# Patient Record
Sex: Male | Born: 1955 | Race: Black or African American | Hispanic: No | Marital: Single | Smoking: Never smoker
Health system: Southern US, Community
[De-identification: ages and names within clinical notes are randomized; demographics above are authoritative.]

## PROBLEM LIST (undated history)

## (undated) ENCOUNTER — Emergency Department (HOSPITAL_COMMUNITY): Admission: EM | Discharge status: Absent without leave | Payer: Self-pay

## (undated) DIAGNOSIS — G9340 Encephalopathy, unspecified: Secondary | ICD-10-CM

## (undated) DIAGNOSIS — R45851 Suicidal ideations: Secondary | ICD-10-CM

## (undated) DIAGNOSIS — F1010 Alcohol abuse, uncomplicated: Secondary | ICD-10-CM

## (undated) DIAGNOSIS — J189 Pneumonia, unspecified organism: Secondary | ICD-10-CM

## (undated) HISTORY — DX: Suicidal ideations: R45.851

## (undated) HISTORY — DX: Encephalopathy, unspecified: G93.40

---

## 2006-12-28 ENCOUNTER — Emergency Department (HOSPITAL_COMMUNITY): Admission: EM | Admit: 2006-12-28 | Discharge: 2006-12-28 | Payer: Self-pay | Admitting: Emergency Medicine

## 2008-02-04 ENCOUNTER — Emergency Department (HOSPITAL_COMMUNITY): Admission: EM | Admit: 2008-02-04 | Discharge: 2008-02-04 | Payer: Self-pay | Admitting: Emergency Medicine

## 2008-06-20 ENCOUNTER — Emergency Department (HOSPITAL_COMMUNITY): Admission: EM | Admit: 2008-06-20 | Discharge: 2008-06-20 | Payer: Self-pay | Admitting: Emergency Medicine

## 2008-06-23 ENCOUNTER — Emergency Department (HOSPITAL_COMMUNITY): Admission: EM | Admit: 2008-06-23 | Discharge: 2008-06-23 | Payer: Self-pay | Admitting: Family Medicine

## 2010-08-12 LAB — CULTURE, ROUTINE-ABSCESS: Culture: NO GROWTH

## 2011-07-24 ENCOUNTER — Emergency Department (HOSPITAL_COMMUNITY): Payer: Self-pay

## 2011-07-24 ENCOUNTER — Encounter (HOSPITAL_COMMUNITY): Payer: Self-pay | Admitting: *Deleted

## 2011-07-24 ENCOUNTER — Emergency Department (HOSPITAL_COMMUNITY)
Admission: EM | Admit: 2011-07-24 | Discharge: 2011-07-25 | Disposition: A | Discharge status: Home / Self Care | Payer: Self-pay | Attending: Emergency Medicine | Admitting: Emergency Medicine

## 2011-07-24 DIAGNOSIS — R509 Fever, unspecified: Secondary | ICD-10-CM | POA: Insufficient documentation

## 2011-07-24 DIAGNOSIS — R10819 Abdominal tenderness, unspecified site: Secondary | ICD-10-CM | POA: Insufficient documentation

## 2011-07-24 DIAGNOSIS — R000 Tachycardia, unspecified: Secondary | ICD-10-CM | POA: Insufficient documentation

## 2011-07-24 DIAGNOSIS — R112 Nausea with vomiting, unspecified: Secondary | ICD-10-CM | POA: Insufficient documentation

## 2011-07-24 DIAGNOSIS — R197 Diarrhea, unspecified: Secondary | ICD-10-CM | POA: Insufficient documentation

## 2011-07-24 DIAGNOSIS — R109 Unspecified abdominal pain: Secondary | ICD-10-CM | POA: Insufficient documentation

## 2011-07-24 HISTORY — DX: Pneumonia, unspecified organism: J18.9

## 2011-07-24 LAB — CBC
HCT: 40.1 % (ref 39.0–52.0)
Hemoglobin: 13.3 g/dL (ref 13.0–17.0)
MCH: 31.8 pg (ref 26.0–34.0)
MCV: 95.9 fL (ref 78.0–100.0)
Platelets: 270 10*3/uL (ref 150–400)
RBC: 4.18 MIL/uL — ABNORMAL LOW (ref 4.22–5.81)
RDW: 12.7 % (ref 11.5–15.5)
WBC: 6.5 10*3/uL (ref 4.0–10.5)

## 2011-07-24 LAB — URINE MICROSCOPIC-ADD ON

## 2011-07-24 LAB — DIFFERENTIAL
Basophils Absolute: 0 10*3/uL (ref 0.0–0.1)
Basophils Relative: 0 % (ref 0–1)
Eosinophils Absolute: 0 10*3/uL (ref 0.0–0.7)
Lymphocytes Relative: 21 % (ref 12–46)
Lymphs Abs: 1.3 10*3/uL (ref 0.7–4.0)
Monocytes Absolute: 0.9 10*3/uL (ref 0.1–1.0)
Monocytes Relative: 14 % — ABNORMAL HIGH (ref 3–12)
Neutro Abs: 4.2 10*3/uL (ref 1.7–7.7)

## 2011-07-24 LAB — URINALYSIS, ROUTINE W REFLEX MICROSCOPIC
Bilirubin Urine: NEGATIVE
Hgb urine dipstick: NEGATIVE
Leukocytes, UA: NEGATIVE

## 2011-07-24 LAB — BASIC METABOLIC PANEL: Sodium: 134 mEq/L — ABNORMAL LOW (ref 135–145)

## 2011-07-24 MED ORDER — SODIUM CHLORIDE 0.9 % IV SOLN
1000.0000 mL | INTRAVENOUS | Status: DC
  Administered 2011-07-24: 1000 mL via INTRAVENOUS

## 2011-07-24 MED ORDER — MORPHINE SULFATE 4 MG/ML IJ SOLN
4.0000 mg | Freq: Once | INTRAMUSCULAR | Status: AC
  Administered 2011-07-24: 4 mg via INTRAVENOUS
  Filled 2011-07-24: qty 1

## 2011-07-24 MED ORDER — ACETAMINOPHEN 325 MG PO TABS
650.0000 mg | ORAL_TABLET | Freq: Once | ORAL | Status: AC
  Administered 2011-07-25: 650 mg via ORAL
  Filled 2011-07-24: qty 2

## 2011-07-24 MED ORDER — IOHEXOL 300 MG/ML  SOLN
100.0000 mL | Freq: Once | INTRAMUSCULAR | Status: AC | PRN
  Administered 2011-07-24: 100 mL via INTRAVENOUS

## 2011-07-24 MED ORDER — PROMETHAZINE HCL 25 MG PO TABS: 25.0000 mg | ORAL_TABLET | Freq: Four times a day (QID) | ORAL | Status: DC | PRN

## 2011-07-24 MED ORDER — LOPERAMIDE HCL 2 MG PO CAPS: 2.0000 mg | ORAL_CAPSULE | Freq: Four times a day (QID) | ORAL | Status: AC | PRN

## 2011-07-24 MED ORDER — HYOSCYAMINE SULFATE 0.125 MG SL SUBL: 0.1250 mg | SUBLINGUAL_TABLET | SUBLINGUAL | Status: DC | PRN

## 2011-07-24 MED ORDER — ONDANSETRON HCL 4 MG/2ML IJ SOLN
4.0000 mg | Freq: Once | INTRAMUSCULAR | Status: AC
  Administered 2011-07-24: 4 mg via INTRAVENOUS
  Filled 2011-07-24: qty 2

## 2011-07-24 NOTE — ED Notes (Signed)
Per [facility] "pt had a BM this morning, was normal, stomach been hurting x 5 days."

## 2011-07-24 NOTE — Discharge Instructions (Signed)
Your blood tests, and CAT scan do not show any significant illness.  Do not have an appendicitis.  We do not need surgery or antibiotics.  Use Tylenol or Motrin for fever.  Use Phenergan for nausea and vomiting.  Use Levsin for abdominal pain, and Imodium for diarrhea.  Followup with health surrogate, her symptoms.  Last more than 3-4 days.  Return for worse or uncontrolled symptoms

## 2011-07-24 NOTE — ED Provider Notes (Signed)
History     CSN: [id number]  Arrival date & time 07/24/11  1728   First MD Initiated Contact with Patient 07/24/11 1952      Chief Complaint  Patient presents with  . Abdominal Pain    (Consider location/radiation/quality/duration/timing/severity/associated sxs/prior treatment) Patient is a 56 y.o. male presenting with abdominal pain. The history is provided by the patient.  Abdominal Pain The primary symptoms of the illness include abdominal pain, fever, nausea, vomiting and diarrhea. The primary symptoms of the illness do not include shortness of breath or dysuria.  Additional symptoms associated with the illness include chills. Symptoms associated with the illness do not include diaphoresis, hematuria or back pain.   the patient is a 56 year old, male, with no significant past medical history.  No history of abdominal surgery.  He presents emergency department with 5 days of lower, pain.  He said that he had fevers, and chills as well.  He has had both vomiting, and diarrhea.  He denies blood in the emesis or stool.  He denies urinary symptoms.  He denies drinking alcohol, smoking cigarettes or using drugs.  Past Medical History  Diagnosis Date  . Pneumonia     History reviewed. No pertinent past surgical history.  No family history on file.  History  Substance Use Topics  . Smoking status: Never Smoker   . Smokeless tobacco: Not on file  . Alcohol Use: No      Review of Systems  Constitutional: Positive for fever and chills. Negative for diaphoresis.  Respiratory: Negative for cough, chest tightness and shortness of breath.   Cardiovascular: Negative for chest pain.  Gastrointestinal: Positive for nausea, vomiting, abdominal pain and diarrhea. Negative for blood in stool.  Genitourinary: Negative for dysuria and hematuria.  Musculoskeletal: Negative for back pain.  Skin: Negative for rash.  Neurological: Negative for headaches.  Hematological: Does not bruise/bleed  easily.  Psychiatric/Behavioral: Negative for confusion.  All other systems reviewed and are negative.    Allergies  Review of patient's allergies indicates no known allergies.  Home Medications  No current outpatient prescriptions on file.  BP 152/82  Pulse 117  Temp(Src) 100.6 F (38.1 C) (Oral)  Resp 18  Wt 145 lb (65.772 kg)  SpO2 96%  Physical Exam  Vitals reviewed. Constitutional: He is oriented to person, place, and time. He appears well-developed and well-nourished. No distress.  HENT:  Head: Normocephalic and atraumatic.  Eyes: Conjunctivae and EOM are normal.  Neck: Normal range of motion.  Cardiovascular:  No murmur heard.      Tachycardia  Pulmonary/Chest: Effort normal. No respiratory distress.  Abdominal: Soft. Bowel sounds are normal. He exhibits no distension and no mass. There is tenderness. There is no rebound and no guarding.       Mild tenderness in bilateral lower quadrants and suprapubic area  Musculoskeletal: Normal range of motion.  Neurological: He is alert and oriented to person, place, and time.  Skin: Skin is warm and dry.  Psychiatric: He has a normal mood and affect. Judgment and thought content normal.    ED Course  Procedures (including critical care time) 56 year old, male, with five-day history of lower abdominal pain, vomiting, diarrhea, and fever.  He is tachycardic, with mild tenderness in his lower abdomen.  He does not have any peritoneal signs.  The concerns are intra-abdominal infection, including diverticulitis ruptured appendicitis or abscess.  Labs Reviewed - No data to display No results found.   No diagnosis found.  11:35 PM sxs resolved  MDM  Abdominal pain, with fever, and tachycardia No acute abdomen. No signs toxicity. sxs resolved in ed        Cheri Guppy, MD 07/24/11 [phone]

## 2011-07-24 NOTE — ED Notes (Signed)
Pt. aware need urine sample. Urinal at bedside.  

## 2011-07-24 NOTE — ED Notes (Signed)
Patient returned from CT

## 2011-07-24 NOTE — ED Notes (Signed)
Pt states "when I'm lying down, I have to sit up because it burns, been vomiting & having diarrhea"

## 2013-05-12 ENCOUNTER — Encounter (HOSPITAL_COMMUNITY): Payer: Self-pay | Admitting: Emergency Medicine

## 2013-05-12 ENCOUNTER — Emergency Department (HOSPITAL_COMMUNITY): Payer: Self-pay

## 2013-05-12 ENCOUNTER — Emergency Department (HOSPITAL_COMMUNITY)
Admission: EM | Admit: 2013-05-12 | Discharge: 2013-05-15 | Disposition: A | Discharge status: Home / Self Care | Payer: Self-pay | Attending: Emergency Medicine | Admitting: Emergency Medicine

## 2013-05-12 DIAGNOSIS — I10 Essential (primary) hypertension: Secondary | ICD-10-CM

## 2013-05-12 DIAGNOSIS — R030 Elevated blood-pressure reading, without diagnosis of hypertension: Secondary | ICD-10-CM | POA: Insufficient documentation

## 2013-05-12 DIAGNOSIS — R45850 Homicidal ideations: Secondary | ICD-10-CM | POA: Insufficient documentation

## 2013-05-12 DIAGNOSIS — R079 Chest pain, unspecified: Secondary | ICD-10-CM | POA: Insufficient documentation

## 2013-05-12 DIAGNOSIS — F3289 Other specified depressive episodes: Secondary | ICD-10-CM | POA: Insufficient documentation

## 2013-05-12 DIAGNOSIS — H5316 Psychophysical visual disturbances: Secondary | ICD-10-CM | POA: Insufficient documentation

## 2013-05-12 DIAGNOSIS — R443 Hallucinations, unspecified: Secondary | ICD-10-CM | POA: Insufficient documentation

## 2013-05-12 DIAGNOSIS — F22 Delusional disorders: Secondary | ICD-10-CM | POA: Insufficient documentation

## 2013-05-12 DIAGNOSIS — J159 Unspecified bacterial pneumonia: Secondary | ICD-10-CM | POA: Insufficient documentation

## 2013-05-12 DIAGNOSIS — F329 Major depressive disorder, single episode, unspecified: Secondary | ICD-10-CM | POA: Insufficient documentation

## 2013-05-12 LAB — CBC WITH DIFFERENTIAL/PLATELET
BASOS ABS: 0.1 10*3/uL (ref 0.0–0.1)
BASOS PCT: 1 % (ref 0–1)
EOS PCT: 4 % (ref 0–5)
Eosinophils Absolute: 0.3 10*3/uL (ref 0.0–0.7)
HEMATOCRIT: 37.3 % — AB (ref 39.0–52.0)
Hemoglobin: 12.8 g/dL — ABNORMAL LOW (ref 13.0–17.0)
Lymphocytes Relative: 24 % (ref 12–46)
Lymphs Abs: 1.7 10*3/uL (ref 0.7–4.0)
MCH: 33 pg (ref 26.0–34.0)
MCHC: 34.3 g/dL (ref 30.0–36.0)
MCV: 96.1 fL (ref 78.0–100.0)
Monocytes Absolute: 0.8 10*3/uL (ref 0.1–1.0)
Monocytes Relative: 11 % (ref 3–12)
NEUTROS ABS: 4 10*3/uL (ref 1.7–7.7)
Neutrophils Relative %: 60 % (ref 43–77)
Platelets: 259 10*3/uL (ref 150–400)
RBC: 3.88 MIL/uL — ABNORMAL LOW (ref 4.22–5.81)
RDW: 12.3 % (ref 11.5–15.5)
WBC: 6.9 10*3/uL (ref 4.0–10.5)

## 2013-05-12 LAB — POCT I-STAT TROPONIN I: Troponin i, poc: 0.01 ng/mL (ref 0.00–0.08)

## 2013-05-12 LAB — URINALYSIS, ROUTINE W REFLEX MICROSCOPIC
BILIRUBIN URINE: NEGATIVE
GLUCOSE, UA: NEGATIVE mg/dL
Hgb urine dipstick: NEGATIVE
KETONES UR: NEGATIVE mg/dL
LEUKOCYTES UA: NEGATIVE
Nitrite: NEGATIVE
PROTEIN: NEGATIVE mg/dL
Specific Gravity, Urine: 1.023 (ref 1.005–1.030)
Urobilinogen, UA: 1 mg/dL (ref 0.0–1.0)
pH: 7 (ref 5.0–8.0)

## 2013-05-12 LAB — BASIC METABOLIC PANEL
BUN: 10 mg/dL (ref 6–23)
CALCIUM: 9.2 mg/dL (ref 8.4–10.5)
CHLORIDE: 99 meq/L (ref 96–112)
CO2: 27 meq/L (ref 19–32)
Creatinine, Ser: 0.62 mg/dL (ref 0.50–1.35)
GFR calc non Af Amer: >90 mL/min (ref >90)
Glucose, Bld: 75 mg/dL (ref 70–99)
Potassium: 4.3 mEq/L (ref 3.7–5.3)
SODIUM: 140 meq/L (ref 137–147)

## 2013-05-12 LAB — ETHANOL

## 2013-05-12 LAB — RAPID URINE DRUG SCREEN, HOSP PERFORMED
Amphetamines: NOT DETECTED
BENZODIAZEPINES: NOT DETECTED
Barbiturates: NOT DETECTED
COCAINE: NOT DETECTED
OPIATES: NOT DETECTED
TETRAHYDROCANNABINOL: NOT DETECTED

## 2013-05-12 MED ORDER — IBUPROFEN 200 MG PO TABS
600.0000 mg | ORAL_TABLET | Freq: Three times a day (TID) | ORAL | Status: DC | PRN
  Administered 2013-05-15: 600 mg via ORAL
  Filled 2013-05-12: qty 3

## 2013-05-12 MED ORDER — ONDANSETRON HCL 4 MG PO TABS: 4.0000 mg | ORAL_TABLET | Freq: Three times a day (TID) | ORAL | Status: DC | PRN

## 2013-05-12 MED ORDER — AZITHROMYCIN 250 MG PO TABS
500.0000 mg | ORAL_TABLET | Freq: Once | ORAL | Status: AC
  Administered 2013-05-12: 500 mg via ORAL
  Filled 2013-05-12: qty 2

## 2013-05-12 MED ORDER — ZOLPIDEM TARTRATE 5 MG PO TABS
5.0000 mg | ORAL_TABLET | Freq: Every evening | ORAL | Status: DC | PRN
  Administered 2013-05-12: 5 mg via ORAL
  Filled 2013-05-12: qty 1

## 2013-05-12 MED ORDER — ACETAMINOPHEN 325 MG PO TABS: 650.0000 mg | ORAL_TABLET | ORAL | Status: DC | PRN

## 2013-05-12 MED ORDER — ALUM & MAG HYDROXIDE-SIMETH 200-200-20 MG/5ML PO SUSP: 30.0000 mL | ORAL | Status: DC | PRN

## 2013-05-12 MED ORDER — LORAZEPAM 1 MG PO TABS
1.0000 mg | ORAL_TABLET | Freq: Three times a day (TID) | ORAL | Status: DC | PRN
  Administered 2013-05-12 – 2013-05-13 (×2): 1 mg via ORAL
  Filled 2013-05-12 (×3): qty 1

## 2013-05-12 NOTE — ED Notes (Signed)
Notified RN,Francis blood pressure elevated. Pt. Bp; 180/110. PA,Tran made aware.

## 2013-05-12 NOTE — Progress Notes (Signed)
Patient was assessed by [facility]. Per, Jonni Sanger referrals have been made to the following hospitals:  [facility], [place], [place].   Writer informed the nurse Ander Purpura) working with the patient.

## 2013-05-12 NOTE — ED Provider Notes (Signed)
Pt received from [place], [place].  CT chest is negative for both infiltrate and mass.  Results discussed w/ patient.  [facility] is present and will facilitate inpatient care for him, though patient will need to be transferred to TCU until bed available.  Punta Rassa, PA-C 05/12/13 2115

## 2013-05-12 NOTE — Progress Notes (Signed)
[facility] declined due to medical acuity.

## 2013-05-12 NOTE — ED Notes (Signed)
Per pt, states he has been having chest pain that keeps him up at night-states he is also having homicidal

## 2013-05-12 NOTE — BH Assessment (Signed)
Received call for tele-assessment. Pt is currently being assessed and triage by Bertram Gala with [facility] who said she will disposition Pt. Consulted with Domenic Moras, PA-C and explained that tele-assessment is not required at this time because [facility] is at [facility] working with the Pt. He agreed tele-assessment can be cancelled.  Orpah Greek Rosana Hoes, [facility] Triage Specialist

## 2013-05-12 NOTE — ED Notes (Signed)
PA informed of pt's b/p and stated she did not want to give pt any medication at this time. Will continue to monitor.
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
## 2013-05-13 DIAGNOSIS — F39 Unspecified mood [affective] disorder: Secondary | ICD-10-CM

## 2013-05-13 DIAGNOSIS — F1020 Alcohol dependence, uncomplicated: Secondary | ICD-10-CM

## 2013-05-13 MED ORDER — FLUOXETINE HCL 20 MG PO CAPS
20.0000 mg | ORAL_CAPSULE | Freq: Every day | ORAL | Status: DC
  Administered 2013-05-13 – 2013-05-15 (×3): 20 mg via ORAL
  Filled 2013-05-13 (×4): qty 1

## 2013-05-13 NOTE — BH Assessment (Signed)
Pt reportedly brought to [facility] by mobile crises [phone]. If this is so mobile crises will need to follow up on this patients disposition. Writer contacted mobile crises worker -Felicia and she sts that she will call back with an update.
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
## 2013-05-13 NOTE — ED Notes (Signed)
Mobile crisis rep has talked to patient. Patient needs to be seen by a psychiatrist for permission to be discharged. [facility] crisis rep will return in the morning after the psychiatrist has evaluated patient.

## 2013-05-13 NOTE — Consult Note (Signed)
[facility] Face-to-Face Psychiatry Consult   Reason for Consult:  Homicidal ideation, depressed mood Referring Physician:  EDP Chase Parsons is an 57 y.o. male.  Assessment: AXIS I:  Mood Disorder NOS and Homicidal ideation, Alcohol dependence AXIS II:  Deferred AXIS III:   Past Medical History  Diagnosis Date  . Pneumonia    AXIS IV:  economic problems, housing problems, occupational problems, other psychosocial or environmental problems, problems related to social environment and problems with primary support group AXIS V:  41-50 serious symptoms  Plan:  Recommend psychiatric Inpatient admission when medically cleared.  Subjective:   Chase Parsons is a 57 y.o. male patient evaluated for homicidal ideation, alcohol dependence, mood d/o.  HPI:  Evaluated male AA patient who was brought in by Mobile crisis for Homicidal thoughts toward people but nobody in particular.  Patient was medically cleared for cardiac event by the EDP.  Patient states she has been staying at a shelter and is always having Homicidal ideations towards people who make fun of him because he cannot read or write.  Patient states there is no specific person he want to kill.  Patient also states he will like detox from alcohol.  He was not able to describe how much alcohol he used to drink lot of beer.  He states his last drink was 3 Months ago.  Patient states he wants help getting into a rehabilitation facility.  Patient states he has battled with Depression and that he used alcohol to treat his depression.  Patient states he feels hopeless and helpless.  Patient states he feels worthless and does not have any help from anybody.  He denies SI/AVH today but states he has been having auditory and visual hallucination for a while.  Patient states he sees shadows at times and did not attribute it to drinking.  He reports poor sleep and poor appetite.  He states he feels like life means nothing any more.  We will admit patient for  safety and stabilization.  We will start him on anti depressant.  We will seek placement at any facility with available bed. HPI Elements:   Location:  [facility]. Quality:  MODERATE. Context:  CHEST PAIN, hOMICIDAL IDEATION.  Past Psychiatric History: Past Medical History  Diagnosis Date  . Pneumonia     reports that he has never smoked. He does not have any smokeless tobacco history on file. He reports that he does not drink alcohol or use illicit drugs. No family history on file.         Allergies:  No Known Allergies  ACT Assessment Complete:  No:   Past Psychiatric History: Diagnosis:  Depressive d/o, Alcohol dependence,   Hospitalizations:  no  Outpatient Care:  no  Substance Abuse Care: denies  Self-Mutilation:  denies  Suicidal Attempts:  twice  Homicidal Behaviors: YES, ONLY THOUGHT   Violent Behaviors:  DENIES   Place of Residence:  SHELTER IN [place] Marital Status:  SINGLE Employed/Unemployed:  UNEMPLOYMENT Education:  LEARNING DISABLED Family Supports:  NONE Objective: Blood pressure 148/99, pulse 93, temperature 98.5 F (36.9 C), temperature source Oral, resp. rate 18, SpO2 98.00%.There is no height or weight on file to calculate BMI. Results for orders placed during the hospital encounter of 05/12/13 (from the past 72 hour(s))  URINE RAPID DRUG SCREEN (HOSP PERFORMED)     Status: None   Collection Time    05/12/13  6:27 PM      Result Value Range   Opiates NONE DETECTED    NONE DETECTED   Cocaine NONE DETECTED  NONE DETECTED   Benzodiazepines NONE DETECTED  NONE DETECTED   Amphetamines NONE DETECTED  NONE DETECTED   Tetrahydrocannabinol NONE DETECTED  NONE DETECTED   Barbiturates NONE DETECTED  NONE DETECTED   Comment:            DRUG SCREEN FOR MEDICAL PURPOSES     ONLY.  IF CONFIRMATION IS NEEDED     FOR ANY PURPOSE, NOTIFY LAB     WITHIN 5 DAYS.                LOWEST DETECTABLE LIMITS     FOR URINE DRUG SCREEN     Drug Class       Cutoff (ng/mL)      Amphetamine      1000     Barbiturate      200     Benzodiazepine   858     Tricyclics       850     Opiates          300     Cocaine          300     THC              50  URINALYSIS, ROUTINE W REFLEX MICROSCOPIC     Status: None   Collection Time    05/12/13  6:27 PM      Result Value Range   Color, Urine YELLOW  YELLOW   APPearance CLEAR  CLEAR   Specific Gravity, Urine 1.023  1.005 - 1.030   pH 7.0  5.0 - 8.0   Glucose, UA NEGATIVE  NEGATIVE mg/dL   Hgb urine dipstick NEGATIVE  NEGATIVE   Bilirubin Urine NEGATIVE  NEGATIVE   Ketones, ur NEGATIVE  NEGATIVE mg/dL   Protein, ur NEGATIVE  NEGATIVE mg/dL   Urobilinogen, UA 1.0  0.0 - 1.0 mg/dL   Nitrite NEGATIVE  NEGATIVE   Leukocytes, UA NEGATIVE  NEGATIVE   Comment: MICROSCOPIC NOT DONE ON URINES WITH NEGATIVE PROTEIN, BLOOD, LEUKOCYTES, NITRITE, OR GLUCOSE <1000 mg/dL.  CBC WITH DIFFERENTIAL     Status: Abnormal   Collection Time    05/12/13  7:21 PM      Result Value Range   WBC 6.9  4.0 - 10.5 K/uL   Comment: WHITE COUNT CONFIRMED ON SMEAR   RBC 3.88 (*) 4.22 - 5.81 MIL/uL   Hemoglobin 12.8 (*) 13.0 - 17.0 g/dL   HCT 37.3 (*) 39.0 - 52.0 %   MCV 96.1  78.0 - 100.0 fL   MCH 33.0  26.0 - 34.0 pg   MCHC 34.3  30.0 - 36.0 g/dL   RDW 12.3  11.5 - 15.5 %   Platelets 259  150 - 400 K/uL   Neutrophils Relative % 60  43 - 77 %   Lymphocytes Relative 24  12 - 46 %   Monocytes Relative 11  3 - 12 %   Eosinophils Relative 4  0 - 5 %   Basophils Relative 1  0 - 1 %   Neutro Abs 4.0  1.7 - 7.7 K/uL   Lymphs Abs 1.7  0.7 - 4.0 K/uL   Monocytes Absolute 0.8  0.1 - 1.0 K/uL   Eosinophils Absolute 0.3  0.0 - 0.7 K/uL   Basophils Absolute 0.1  0.0 - 0.1 K/uL   RBC Morphology STOMATOCYTES     WBC Morphology VACUOLATED NEUTROPHILS    BASIC METABOLIC PANEL  Status: None   Collection Time    05/12/13  7:21 PM      Result Value Range   Sodium 140  137 - 147 mEq/L   Potassium 4.3  3.7 - 5.3 mEq/L   Comment: SLIGHT HEMOLYSIS      HEMOLYSIS AT THIS LEVEL MAY AFFECT RESULT   Chloride 99  96 - 112 mEq/L   CO2 27  19 - 32 mEq/L   Glucose, Bld 75  70 - 99 mg/dL   BUN 10  6 - 23 mg/dL   Creatinine, Ser 0.62  0.50 - 1.35 mg/dL   Calcium 9.2  8.4 - 10.5 mg/dL   GFR calc non Af Amer >90  >90 mL/min   GFR calc Af Amer >90  >90 mL/min   Comment: (NOTE)     The eGFR has been calculated using the CKD EPI equation.     This calculation has not been validated in all clinical situations.     eGFR's persistently <90 mL/min signify possible Chronic Kidney     Disease.  ETHANOL     Status: None   Collection Time    05/12/13  7:21 PM      Result Value Range   Alcohol, Ethyl (B) <11  0 - 11 mg/dL   Comment:            LOWEST DETECTABLE LIMIT FOR     SERUM ALCOHOL IS 11 mg/dL     FOR MEDICAL PURPOSES ONLY  POCT I-STAT TROPONIN I     Status: None   Collection Time    05/12/13  7:27 PM      Result Value Range   Troponin i, poc 0.01  0.00 - 0.08 ng/mL   Comment 3            Comment: Due to the release kinetics of cTnI,     a negative result within the first hours     of the onset of symptoms does not rule out     myocardial infarction with certainty.     If myocardial infarction is still suspected,     repeat the test at appropriate intervals.   Labs are reviewed and are pertinent for Unremarkable lab results.  Current Facility-Administered Medications  Medication Dose Route Frequency Provider Last Rate Last Dose  . acetaminophen (TYLENOL) tablet 650 mg  650 mg Oral Q4H PRN Domenic Moras, PA-C      . alum & mag hydroxide-simeth (MAALOX/MYLANTA) 200-200-20 MG/5ML suspension 30 mL  30 mL Oral PRN Domenic Moras, PA-C      . ibuprofen (ADVIL,MOTRIN) tablet 600 mg  600 mg Oral Q8H PRN Domenic Moras, PA-C      . LORazepam (ATIVAN) tablet 1 mg  1 mg Oral Q8H PRN Domenic Moras, PA-C   1 mg at 05/12/13 2255  . ondansetron (ZOFRAN) tablet 4 mg  4 mg Oral Q8H PRN Domenic Moras, PA-C      . zolpidem (AMBIEN) tablet 5 mg  5 mg Oral QHS PRN Domenic Moras, PA-C   5 mg at 05/12/13 2255   No current outpatient prescriptions on file.    Psychiatric Specialty Exam:     Blood pressure 148/99, pulse 93, temperature 98.5 F (36.9 C), temperature source Oral, resp. rate 18, SpO2 98.00%.There is no height or weight on file to calculate BMI.  General Appearance: Casual and Disheveled  Eye Contact::  Good  Speech:  Clear and Coherent and Normal Rate  Volume:  Normal  Mood:  Anxious, Depressed and Hopeless  Affect:  Congruent, Depressed and Flat  Thought Process:  Coherent and Goal Directed  Orientation:  Full (Time, Place, and Person)  Thought Content:  Homicidal ideation  Suicidal Thoughts:  No  Homicidal Thoughts:  Yes.  without intent/plan  Memory:  Immediate;   Good Recent;   Fair Remote;   Fair  Judgement:  Poor  Insight:  Fair  Psychomotor Activity:  Normal  Concentration:  Fair  Recall:  NA  Akathisia:  NA  Handed:  Right  AIMS (if indicated):     Assets:  Desire for Improvement Housing  Sleep:      Treatment Plan Summary:  Consult and face to face interview with Dr Louretta Shorten We will admit patient for safety and stabilization We will seek referral to other facilities with available bed for admission We will start patient on an antidepressant , Prozac 20 mg po daily   Charmaine Downs, C   PMHNP-BC 05/13/2013 4:15 PM  Patient was seen face-to-face for this evaluation along with physician extender and reviewed the information documented by physician extender and agree with the treatment plan.  Ante Arredondo,JANARDHAHA R. 05/13/2013 6:49 PM

## 2013-05-13 NOTE — ED Notes (Signed)
Representative of mobile crisis center is here

## 2013-05-13 NOTE — BH Assessment (Signed)
Received call from Clayborne Dana, RN who said Mobile Crisis counselor was requesting a psychiatric consult for Pt to determine if Pt was safe to be discharged home. Contacted Darlyne Russian, [place] who said he is unable to perform a risk assessment and Pt must be seen by a psychiatrist in the morning. Notified Clayborne Dana who will explain situation to [employer]. Noted in shift report that Pt will need to be seen by psychiatrist in the morning.  Orpah Greek Rosana Hoes, [facility] Triage Specialist

## 2013-05-13 NOTE — ED Notes (Signed)
The mobile crisis center called and will follow up on the patient within the hour

## 2013-05-13 NOTE — ED Notes (Signed)
NP in to talk with patient about being placed and prescribing him some antidepressants.

## 2013-05-14 MED ORDER — DIPHENOXYLATE-ATROPINE 2.5-0.025 MG PO TABS
1.0000 | ORAL_TABLET | Freq: Four times a day (QID) | ORAL | Status: DC | PRN
  Administered 2013-05-14 (×2): 1 via ORAL
  Filled 2013-05-14 (×2): qty 1

## 2013-05-14 NOTE — ED Notes (Signed)
Pt continues with diarrhea.  Per Lyndee Leo in pharmacy, pt may have additional dose now of lomotil.  Med given.

## 2013-05-14 NOTE — Consult Note (Signed)
[place] Follow up Psychiatry Consult   Reason for Consult:  Homicidal ideation, depressed mood Referring Physician:  EDP Chase Parsons is an 58 y.o. male.  Assessment: AXIS I:  Mood Disorder NOS and Homicidal ideation, Alcohol dependence AXIS II:  Deferred AXIS III:   Past Medical History  Diagnosis Date  . Pneumonia    AXIS IV:  economic problems, housing problems, occupational problems, other psychosocial or environmental problems, problems related to social environment and problems with primary support group AXIS V:  41-50 serious symptoms  Plan:  Recommend psychiatric Inpatient admission when medically cleared.  Subjective:   Chase Parsons is a 58 y.o. male patient evaluated for homicidal ideation, alcohol dependence, mood d/o.  HPI:  Patient states that he had a bad dream.  "I just woke up from bad dream and I need to tell somebody."  Patient would not state if he was still having homicidal or suicidal thoughts when asked. " In my dream I saw my self killing somebody."  Mobile Crisis staff came to see patient states that patient is always having dreams.  Mobile crises states that patient told her he was not suicidal or homicidal.    Instructed that we would watch patient overnight for another night and if no changes discharge in the morning.  Another staff member will be here on Monday to assess patient again also.    HPI Elements:   Location:  [facility]. Quality:  MODERATE. Context:  CHEST PAIN, hOMICIDAL IDEATION.  Past Psychiatric History: Past Medical History  Diagnosis Date  . Pneumonia     reports that he has never smoked. He does not have any smokeless tobacco history on file. He reports that he does not drink alcohol or use illicit drugs. No family history on file.         Allergies:  No Known Allergies  ACT Assessment Complete:  No:   Past Psychiatric History: Diagnosis:  Depressive d/o, Alcohol dependence,   Hospitalizations:  no  Outpatient Care:  no   Substance Abuse Care: denies  Self-Mutilation:  denies  Suicidal Attempts:  twice  Homicidal Behaviors: YES, ONLY THOUGHT   Violent Behaviors:  DENIES   Place of Residence:  [place] Marital Status:  SINGLE Employed/Unemployed:  UNEMPLOYMENT Education:  LEARNING DISABLED Family Supports:  NONE Objective: Blood pressure 139/83, pulse 98, temperature 98.1 F (36.7 C), temperature source Oral, resp. rate 18, SpO2 100.00%.There is no height or weight on file to calculate BMI. Results for orders placed during the hospital encounter of 05/12/13 (from the past 72 hour(s))  URINE RAPID DRUG SCREEN (HOSP PERFORMED)     Status: None   Collection Time    05/12/13  6:27 PM      Result Value Range   Opiates NONE DETECTED  NONE DETECTED   Cocaine NONE DETECTED  NONE DETECTED   Benzodiazepines NONE DETECTED  NONE DETECTED   Amphetamines NONE DETECTED  NONE DETECTED   Tetrahydrocannabinol NONE DETECTED  NONE DETECTED   Barbiturates NONE DETECTED  NONE DETECTED   Comment:            DRUG SCREEN FOR MEDICAL PURPOSES     ONLY.  IF CONFIRMATION IS NEEDED     FOR ANY PURPOSE, NOTIFY LAB     WITHIN 5 DAYS.                LOWEST DETECTABLE LIMITS     FOR URINE DRUG SCREEN     Drug Class  Cutoff (ng/mL)     Amphetamine      1000     Barbiturate      200     Benzodiazepine   354     Tricyclics       656     Opiates          300     Cocaine          300     THC              50  URINALYSIS, ROUTINE W REFLEX MICROSCOPIC     Status: None   Collection Time    05/12/13  6:27 PM      Result Value Range   Color, Urine YELLOW  YELLOW   APPearance CLEAR  CLEAR   Specific Gravity, Urine 1.023  1.005 - 1.030   pH 7.0  5.0 - 8.0   Glucose, UA NEGATIVE  NEGATIVE mg/dL   Hgb urine dipstick NEGATIVE  NEGATIVE   Bilirubin Urine NEGATIVE  NEGATIVE   Ketones, ur NEGATIVE  NEGATIVE mg/dL   Protein, ur NEGATIVE  NEGATIVE mg/dL   Urobilinogen, UA 1.0  0.0 - 1.0 mg/dL   Nitrite NEGATIVE   NEGATIVE   Leukocytes, UA NEGATIVE  NEGATIVE   Comment: MICROSCOPIC NOT DONE ON URINES WITH NEGATIVE PROTEIN, BLOOD, LEUKOCYTES, NITRITE, OR GLUCOSE <1000 mg/dL.  CBC WITH DIFFERENTIAL     Status: Abnormal   Collection Time    05/12/13  7:21 PM      Result Value Range   WBC 6.9  4.0 - 10.5 K/uL   Comment: WHITE COUNT CONFIRMED ON SMEAR   RBC 3.88 (*) 4.22 - 5.81 MIL/uL   Hemoglobin 12.8 (*) 13.0 - 17.0 g/dL   HCT 37.3 (*) 39.0 - 52.0 %   MCV 96.1  78.0 - 100.0 fL   MCH 33.0  26.0 - 34.0 pg   MCHC 34.3  30.0 - 36.0 g/dL   RDW 12.3  11.5 - 15.5 %   Platelets 259  150 - 400 K/uL   Neutrophils Relative % 60  43 - 77 %   Lymphocytes Relative 24  12 - 46 %   Monocytes Relative 11  3 - 12 %   Eosinophils Relative 4  0 - 5 %   Basophils Relative 1  0 - 1 %   Neutro Abs 4.0  1.7 - 7.7 K/uL   Lymphs Abs 1.7  0.7 - 4.0 K/uL   Monocytes Absolute 0.8  0.1 - 1.0 K/uL   Eosinophils Absolute 0.3  0.0 - 0.7 K/uL   Basophils Absolute 0.1  0.0 - 0.1 K/uL   RBC Morphology STOMATOCYTES     WBC Morphology VACUOLATED NEUTROPHILS    BASIC METABOLIC PANEL     Status: None   Collection Time    05/12/13  7:21 PM      Result Value Range   Sodium 140  137 - 147 mEq/L   Potassium 4.3  3.7 - 5.3 mEq/L   Comment: SLIGHT HEMOLYSIS     HEMOLYSIS AT THIS LEVEL MAY AFFECT RESULT   Chloride 99  96 - 112 mEq/L   CO2 27  19 - 32 mEq/L   Glucose, Bld 75  70 - 99 mg/dL   BUN 10  6 - 23 mg/dL   Creatinine, Ser 0.62  0.50 - 1.35 mg/dL   Calcium 9.2  8.4 - 10.5 mg/dL   GFR calc non Af Amer >90  >90 mL/min  GFR calc Af Amer >90  >90 mL/min   Comment: (NOTE)     The eGFR has been calculated using the CKD EPI equation.     This calculation has not been validated in all clinical situations.     eGFR's persistently <90 mL/min signify possible Chronic Kidney     Disease.  ETHANOL     Status: None   Collection Time    05/12/13  7:21 PM      Result Value Range   Alcohol, Ethyl (B) <11  0 - 11 mg/dL   Comment:             LOWEST DETECTABLE LIMIT FOR     SERUM ALCOHOL IS 11 mg/dL     FOR MEDICAL PURPOSES ONLY  POCT I-STAT TROPONIN I     Status: None   Collection Time    05/12/13  7:27 PM      Result Value Range   Troponin i, poc 0.01  0.00 - 0.08 ng/mL   Comment 3            Comment: Due to the release kinetics of cTnI,     a negative result within the first hours     of the onset of symptoms does not rule out     myocardial infarction with certainty.     If myocardial infarction is still suspected,     repeat the test at appropriate intervals.   Labs are reviewed and are pertinent for Unremarkable lab results.  Current Facility-Administered Medications  Medication Dose Route Frequency Provider Last Rate Last Dose  . acetaminophen (TYLENOL) tablet 650 mg  650 mg Oral Q4H PRN Domenic Moras, PA-C      . alum & mag hydroxide-simeth (MAALOX/MYLANTA) 200-200-20 MG/5ML suspension 30 mL  30 mL Oral PRN Domenic Moras, PA-C      . diphenoxylate-atropine (LOMOTIL) 2.5-0.025 MG per tablet 1 tablet  1 tablet Oral QID PRN Kathalene Frames, MD   1 tablet at 05/14/13 1212  . FLUoxetine (PROZAC) capsule 20 mg  20 mg Oral Daily Delfin Gant, NP   20 mg at 05/14/13 0931  . ibuprofen (ADVIL,MOTRIN) tablet 600 mg  600 mg Oral Q8H PRN Domenic Moras, PA-C      . LORazepam (ATIVAN) tablet 1 mg  1 mg Oral Q8H PRN Domenic Moras, PA-C   1 mg at 05/13/13 1657  . ondansetron (ZOFRAN) tablet 4 mg  4 mg Oral Q8H PRN Domenic Moras, PA-C      . zolpidem (AMBIEN) tablet 5 mg  5 mg Oral QHS PRN Domenic Moras, PA-C   5 mg at 05/12/13 2255   No current outpatient prescriptions on file.    Psychiatric Specialty Exam:     Blood pressure 139/83, pulse 98, temperature 98.1 F (36.7 C), temperature source Oral, resp. rate 18, SpO2 100.00%.There is no height or weight on file to calculate BMI.  General Appearance: Casual and Disheveled  Eye Contact::  Good  Speech:  Clear and Coherent and Normal Rate  Volume:  Normal  Mood:  Anxious, Depressed  and Hopeless  Affect:  Congruent, Depressed and Flat  Thought Process:  Coherent and Goal Directed  Orientation:  Full (Time, Place, and Person)  Thought Content:  Homicidal ideation  Suicidal Thoughts:  No  Homicidal Thoughts:  Yes.  without intent/plan  Memory:  Immediate;   Good Recent;   Fair Remote;   Fair  Judgement:  Poor  Insight:  Fair  Psychomotor Activity:  Normal  Concentration:  Fair  Recall:  NA  Akathisia:  NA  Handed:  Right  AIMS (if indicated):     Assets:  Desire for Improvement Housing  Sleep:      Treatment Plan Summary:  Consult and face to face interview with Dr Louretta Shorten We will admit patient for safety and stabilization We will seek referral to other facilities with available bed for admission We will start patient on an antidepressant , Prozac 20 mg po daily    Patient was seen face-to-face for this evaluation along with physician extender and reviewed the information documented by physician extender and agree with the treatment plan. If no changes and patient continues to deny suicidal ideation and homicidal ideation and discharge home to follow up wit primary out patient provider.   Earleen Newport FNP-BC 05/14/2013 7:09 PM  Patient was seen face to face for this evaluation and case discussed with physician extender. Reviewed the information documented by physician extender and agree with the treatment plan.  Vannia Pola,JANARDHAHA R. 05/15/2013 5:37 PM

## 2013-05-14 NOTE — ED Notes (Signed)
Mobile crisis at bedside.  Attempting to get bed at [facility] when able.  Belongings are there and pt notified that they are being held for patient.

## 2013-05-14 NOTE — ED Provider Notes (Signed)
Pt has complained of some loose stools today.  Will rx lomotil qid prn  Kathalene Frames, MD 05/14/13 1013

## 2013-05-14 NOTE — ED Notes (Signed)
Breakfast tray was given.  

## 2013-05-15 DIAGNOSIS — R443 Hallucinations, unspecified: Secondary | ICD-10-CM

## 2013-05-15 DIAGNOSIS — R45850 Homicidal ideations: Secondary | ICD-10-CM

## 2013-05-15 NOTE — ED Notes (Signed)
Patient to be discharged today. Will be d/c'd the [facility]. Transportation to be arranged by TTS

## 2013-05-15 NOTE — Consult Note (Signed)
**Note Chase-Identified via Obfuscation** [place] Follow up Psychiatry Consult   Reason for Consult:  Homicidal ideation, depressed mood Referring Physician:  EDP Chase Parsons is an 58 y.o. male.  Assessment: AXIS I:  Mood Disorder NOS and Homicidal ideation, Alcohol dependence AXIS II:  Deferred AXIS III:   Past Medical History  Diagnosis Date  . Pneumonia    AXIS IV:  economic problems, housing problems, occupational problems, other psychosocial or environmental problems, problems related to social environment and problems with primary support group AXIS V:  41-50 serious symptoms  Plan: Initail was  Recommend psychiatric Inpatient admission when medically cleared.  Subjective:   Chase Parsons is a 58 y.o. male patient evaluated for homicidal ideation, alcohol dependence, mood d/o.  HPI:  Patient states that he had a bad dream.  "I just woke up from bad dream and I need to tell somebody." Has been here for couple of days and feels better. Patient doing better and denies suicidal toughts or having impulsive mood.Marland Kitchen    HPI Elements:   Location:  [facility]. Quality:  MODERATE. Context:  CHEST PAIN, hOMICIDAL IDEATION.  Past Psychiatric History: Past Medical History  Diagnosis Date  . Pneumonia     reports that he has never smoked. He does not have any smokeless tobacco history on file. He reports that he does not drink alcohol or use illicit drugs. No family history on file.         Allergies:  No Known Allergies  ACT Assessment Complete:  No:   Past Psychiatric History: Diagnosis:  Depressive d/o, Alcohol dependence,   Hospitalizations:  no  Outpatient Care:  no  Substance Abuse Care: denies  Self-Mutilation:  denies  Suicidal Attempts:  twice  Homicidal Behaviors: YES, ONLY THOUGHT   Violent Behaviors:  DENIES   Place of Residence:  [place] Marital Status:  SINGLE Employed/Unemployed:  UNEMPLOYMENT Education:  LEARNING DISABLED Family Supports:  NONE Objective: Blood pressure 137/82,  pulse 96, temperature 98.2 F (36.8 C), temperature source Oral, resp. rate 20, SpO2 98.00%.There is no height or weight on file to calculate BMI. Results for orders placed during the hospital encounter of 05/12/13 (from the past 72 hour(s))  URINE RAPID DRUG SCREEN (HOSP PERFORMED)     Status: None   Collection Time    05/12/13  6:27 PM      Result Value Range   Opiates NONE DETECTED  NONE DETECTED   Cocaine NONE DETECTED  NONE DETECTED   Benzodiazepines NONE DETECTED  NONE DETECTED   Amphetamines NONE DETECTED  NONE DETECTED   Tetrahydrocannabinol NONE DETECTED  NONE DETECTED   Barbiturates NONE DETECTED  NONE DETECTED   Comment:            DRUG SCREEN FOR MEDICAL PURPOSES     ONLY.  IF CONFIRMATION IS NEEDED     FOR ANY PURPOSE, NOTIFY LAB     WITHIN 5 DAYS.                LOWEST DETECTABLE LIMITS     FOR URINE DRUG SCREEN     Drug Class       Cutoff (ng/mL)     Amphetamine      1000     Barbiturate      200     Benzodiazepine   744     Tricyclics       514     Opiates          300     Cocaine  300     THC              50  URINALYSIS, ROUTINE W REFLEX MICROSCOPIC     Status: None   Collection Time    05/12/13  6:27 PM      Result Value Range   Color, Urine YELLOW  YELLOW   APPearance CLEAR  CLEAR   Specific Gravity, Urine 1.023  1.005 - 1.030   pH 7.0  5.0 - 8.0   Glucose, UA NEGATIVE  NEGATIVE mg/dL   Hgb urine dipstick NEGATIVE  NEGATIVE   Bilirubin Urine NEGATIVE  NEGATIVE   Ketones, ur NEGATIVE  NEGATIVE mg/dL   Protein, ur NEGATIVE  NEGATIVE mg/dL   Urobilinogen, UA 1.0  0.0 - 1.0 mg/dL   Nitrite NEGATIVE  NEGATIVE   Leukocytes, UA NEGATIVE  NEGATIVE   Comment: MICROSCOPIC NOT DONE ON URINES WITH NEGATIVE PROTEIN, BLOOD, LEUKOCYTES, NITRITE, OR GLUCOSE <1000 mg/dL.  CBC WITH DIFFERENTIAL     Status: Abnormal   Collection Time    05/12/13  7:21 PM      Result Value Range   WBC 6.9  4.0 - 10.5 K/uL   Comment: WHITE COUNT CONFIRMED ON SMEAR   RBC 3.88  (*) 4.22 - 5.81 MIL/uL   Hemoglobin 12.8 (*) 13.0 - 17.0 g/dL   HCT 37.3 (*) 39.0 - 52.0 %   MCV 96.1  78.0 - 100.0 fL   MCH 33.0  26.0 - 34.0 pg   MCHC 34.3  30.0 - 36.0 g/dL   RDW 12.3  11.5 - 15.5 %   Platelets 259  150 - 400 K/uL   Neutrophils Relative % 60  43 - 77 %   Lymphocytes Relative 24  12 - 46 %   Monocytes Relative 11  3 - 12 %   Eosinophils Relative 4  0 - 5 %   Basophils Relative 1  0 - 1 %   Neutro Abs 4.0  1.7 - 7.7 K/uL   Lymphs Abs 1.7  0.7 - 4.0 K/uL   Monocytes Absolute 0.8  0.1 - 1.0 K/uL   Eosinophils Absolute 0.3  0.0 - 0.7 K/uL   Basophils Absolute 0.1  0.0 - 0.1 K/uL   RBC Morphology STOMATOCYTES     WBC Morphology VACUOLATED NEUTROPHILS    BASIC METABOLIC PANEL     Status: None   Collection Time    05/12/13  7:21 PM      Result Value Range   Sodium 140  137 - 147 mEq/L   Potassium 4.3  3.7 - 5.3 mEq/L   Comment: SLIGHT HEMOLYSIS     HEMOLYSIS AT THIS LEVEL MAY AFFECT RESULT   Chloride 99  96 - 112 mEq/L   CO2 27  19 - 32 mEq/L   Glucose, Bld 75  70 - 99 mg/dL   BUN 10  6 - 23 mg/dL   Creatinine, Ser 0.62  0.50 - 1.35 mg/dL   Calcium 9.2  8.4 - 10.5 mg/dL   GFR calc non Af Amer >90  >90 mL/min   GFR calc Af Amer >90  >90 mL/min   Comment: (NOTE)     The eGFR has been calculated using the CKD EPI equation.     This calculation has not been validated in all clinical situations.     eGFR's persistently <90 mL/min signify possible Chronic Kidney     Disease.  ETHANOL     Status: None   Collection Time  05/12/13  7:21 PM      Result Value Range   Alcohol, Ethyl (B) <11  0 - 11 mg/dL   Comment:            LOWEST DETECTABLE LIMIT FOR     SERUM ALCOHOL IS 11 mg/dL     FOR MEDICAL PURPOSES ONLY  POCT I-STAT TROPONIN I     Status: None   Collection Time    05/12/13  7:27 PM      Result Value Range   Troponin i, poc 0.01  0.00 - 0.08 ng/mL   Comment 3            Comment: Due to the release kinetics of cTnI,     a negative result within the  first hours     of the onset of symptoms does not rule out     myocardial infarction with certainty.     If myocardial infarction is still suspected,     repeat the test at appropriate intervals.   Labs are reviewed and are pertinent for Unremarkable lab results.  Current Facility-Administered Medications  Medication Dose Route Frequency Provider Last Rate Last Dose  . acetaminophen (TYLENOL) tablet 650 mg  650 mg Oral Q4H PRN Domenic Moras, PA-C      . alum & mag hydroxide-simeth (MAALOX/MYLANTA) 200-200-20 MG/5ML suspension 30 mL  30 mL Oral PRN Domenic Moras, PA-C      . diphenoxylate-atropine (LOMOTIL) 2.5-0.025 MG per tablet 1 tablet  1 tablet Oral QID PRN Kathalene Frames, MD   1 tablet at 05/14/13 1212  . FLUoxetine (PROZAC) capsule 20 mg  20 mg Oral Daily Delfin Gant, NP   20 mg at 05/15/13 1002  . ibuprofen (ADVIL,MOTRIN) tablet 600 mg  600 mg Oral Q8H PRN Domenic Moras, PA-C   600 mg at 05/15/13 1146  . LORazepam (ATIVAN) tablet 1 mg  1 mg Oral Q8H PRN Domenic Moras, PA-C   1 mg at 05/13/13 1657  . ondansetron (ZOFRAN) tablet 4 mg  4 mg Oral Q8H PRN Domenic Moras, PA-C      . zolpidem (AMBIEN) tablet 5 mg  5 mg Oral QHS PRN Domenic Moras, PA-C   5 mg at 05/12/13 2255   No current outpatient prescriptions on file.    Psychiatric Specialty Exam:     Blood pressure 137/82, pulse 96, temperature 98.2 F (36.8 C), temperature source Oral, resp. rate 20, SpO2 98.00%.There is no height or weight on file to calculate BMI.  General Appearance: Casual and Disheveled  Eye Contact::  Good  Speech:  Clear and Coherent and Normal Rate  Volume:  Normal  Mood:  Anxious, Depressed and Hopeless  Affect:  Congruent, Depressed and Flat  Thought Process:  Coherent and Goal Directed  Orientation:  Full (Time, Place, and Person)  Thought Content:  Homicidal ideation  Suicidal Thoughts:  No  Homicidal Thoughts:  No  Memory:  Immediate;   Good Recent;   Fair Remote;   Fair  Judgement:  Poor  Insight:   Fair  Psychomotor Activity:  Normal  Concentration:  Fair  Recall:  NA  Akathisia:  NA  Handed:  Right  AIMS (if indicated):     Assets:  Desire for Improvement Housing  Sleep:      Treatment Plan Summary:   Patient has been doing better. Has been started on Prozac. Mobile crises is here to evaluate. He can be discharged to [facility].   Chase Parsons, Chase Garson FNP-BC 05/15/2013  12:09 PM

## 2013-05-15 NOTE — ED Notes (Signed)
Mobile crisis rep in to eval

## 2013-05-15 NOTE — [facility] Suicide Risk Assessment (Signed)
Suicide Risk Assessment  Discharge Assessment     Demographic Factors:  Male  Mental Status Per Nursing Assessment::   On Admission:     Current Mental Status by Physician: Self-harm thoughts and in general homicidal tougths at presentation  Loss Factors: Decrease in vocational status and Financial problems/change in socioeconomic status  Historical Factors: Impulsivity  Risk Reduction Factors:   Positive social support and wanting to go in group home  Continued Clinical Symptoms:  Depression:   Anhedonia Comorbid alcohol abuse/dependence Hopelessness More than one psychiatric diagnosis Previous Psychiatric Diagnoses and Treatments  Cognitive Features That Contribute To Risk:  Closed-mindedness Polarized thinking    Suicide Risk:  Minimal: No identifiable suicidal ideation.  Patients presenting with no risk factors but with morbid ruminations; may be classified as minimal risk based on the severity of the depressive symptoms  Discharge Diagnoses:   AXIS I:  mood disorder unspecified. Depression NOS AXIS II:  Deferred AXIS III:   Past Medical History  Diagnosis Date  . Pneumonia    AXIS IV:  housing problems, other psychosocial or environmental problems and problems with primary support group AXIS V:  51-60 moderate symptoms  Plan Of Care/Follow-up recommendations:  Activity:  as tolerated Diet:  regular Not psychotic and denies toughts to harm. Feels better and wants to be discharged to group home. Discharge to [facility] where he has been accepted. Follow up with outpatient services for psychiatric needs.  Is patient on multiple antipsychotic therapies at discharge:  No   Has Patient had three or more failed trials of antipsychotic monotherapy by history:  No  Recommended Plan for Multiple Antipsychotic Therapies: NA  Chase Parsons 05/15/2013, 1:25 PM

## 2013-05-15 NOTE — [facility] Counselor (Addendum)
1:30 pm Writer called and left voicemail for [employer] at [employer] re: pt's d/c. Writer gave pt information for [place] and pt given bus pass. Following resources given:  [place] LINE:  [phone] or [place] [address], [place] [address]  Psychiatrists [facility]   [facility] [address]    [address], [place], [place] [address]    [place], [place] [address] [phone]      [phone]  Dr. Norma Fredrickson     [facility] [address]    [place] [address]    [place] [address] [phone]      [phone]  Therapists [facility]    [facility] [address], [place]      [phone]  [facility] [address]     [address] [place] [address]    [place], [place]   Mobile Crisis Teams [facility]    Assertive  Mobile Crisis Care Unit    Psychotherapeutic Services [phone]     [address], [place], [place]   Per Varney Biles at [facility], [facility] will accept pt upon discharge.  Arnold Long, [place] Assessment Counselor

## 2013-05-23 NOTE — ED Provider Notes (Signed)
Medical screening examination/treatment/procedure(s) were performed by non-physician practitioner and as supervising physician I was immediately available for consultation/collaboration.  EKG Interpretation   None         Saddie Benders. Dorna Mai, MD 05/23/13 7741

## 2013-06-24 ENCOUNTER — Emergency Department (HOSPITAL_COMMUNITY): Payer: Self-pay

## 2013-06-24 ENCOUNTER — Encounter (HOSPITAL_COMMUNITY): Payer: Self-pay | Admitting: Emergency Medicine

## 2013-06-24 ENCOUNTER — Emergency Department (HOSPITAL_COMMUNITY)
Admission: EM | Admit: 2013-06-24 | Discharge: 2013-06-24 | Disposition: A | Discharge status: Home / Self Care | Payer: Self-pay | Attending: Emergency Medicine | Admitting: Emergency Medicine

## 2013-06-24 DIAGNOSIS — Z8701 Personal history of pneumonia (recurrent): Secondary | ICD-10-CM | POA: Insufficient documentation

## 2013-06-24 DIAGNOSIS — Z79899 Other long term (current) drug therapy: Secondary | ICD-10-CM | POA: Insufficient documentation

## 2013-06-24 DIAGNOSIS — S298XXA Other specified injuries of thorax, initial encounter: Secondary | ICD-10-CM | POA: Insufficient documentation

## 2013-06-24 DIAGNOSIS — Y929 Unspecified place or not applicable: Secondary | ICD-10-CM | POA: Insufficient documentation

## 2013-06-24 DIAGNOSIS — W010XXA Fall on same level from slipping, tripping and stumbling without subsequent striking against object, initial encounter: Secondary | ICD-10-CM | POA: Insufficient documentation

## 2013-06-24 DIAGNOSIS — S42002A Fracture of unspecified part of left clavicle, initial encounter for closed fracture: Secondary | ICD-10-CM

## 2013-06-24 DIAGNOSIS — S42009A Fracture of unspecified part of unspecified clavicle, initial encounter for closed fracture: Secondary | ICD-10-CM | POA: Insufficient documentation

## 2013-06-24 DIAGNOSIS — F1010 Alcohol abuse, uncomplicated: Secondary | ICD-10-CM | POA: Insufficient documentation

## 2013-06-24 DIAGNOSIS — Y939 Activity, unspecified: Secondary | ICD-10-CM | POA: Insufficient documentation

## 2013-06-24 MED ORDER — HYDROCODONE-ACETAMINOPHEN 5-325 MG PO TABS: 1.0000 | ORAL_TABLET | ORAL | Status: DC | PRN

## 2013-06-24 MED ORDER — HYDROCODONE-ACETAMINOPHEN 5-325 MG PO TABS
1.0000 | ORAL_TABLET | Freq: Once | ORAL | Status: AC
  Administered 2013-06-24: 1 via ORAL
  Filled 2013-06-24: qty 1

## 2013-06-24 NOTE — ED Notes (Signed)
He states that this morning he slipped on some ice and fell, thereby injuring his left shoulder which is persistently painful.

## 2013-06-24 NOTE — ED Provider Notes (Signed)
CSN: [id number]     Arrival date & time 06/24/13  0865 History   First MD Initiated Contact with Patient 06/24/13 [phone]     Chief Complaint  Patient presents with  . Fall  . Shoulder Injury     (Consider location/radiation/quality/duration/timing/severity/associated sxs/prior Treatment) HPI Comments: 58 year old male presents shortly after slipping on ice and hurting his shoulder. He states is not his head or neck. He did not lose consciousness. Patient admits drinking but says he last drank last night. He is hurting mostly over his left anterior chest over his collarbone. No weakness or numbness in his arm. The pain worsens with range of motion of his shoulder. The pain at this time is "severe".   Past Medical History  Diagnosis Date  . Pneumonia    History reviewed. No pertinent past surgical history. History reviewed. No pertinent family history. History  Substance Use Topics  . Smoking status: Never Smoker   . Smokeless tobacco: Not on file  . Alcohol Use: No    Review of Systems  Gastrointestinal: Negative for vomiting.  Musculoskeletal: Negative for neck pain.       Left shoulder pain  Skin: Negative for color change and wound.  Neurological: Negative for syncope, weakness, numbness and headaches.      Allergies  Review of patient's allergies indicates no known allergies.  Home Medications   Current Outpatient Rx  Name  Route  Sig  Dispense  Refill  . Lurasidone HCl (LATUDA) 20 MG TABS   Oral   Take 20 mg by mouth daily.          BP 146/85  Pulse 107  Temp(Src) 97.9 F (36.6 C) (Oral)  Resp 18  SpO2 97% Physical Exam  Nursing note and vitals reviewed. Constitutional: He is oriented to person, place, and time. He appears well-developed and well-nourished.  HENT:  Head: Normocephalic and atraumatic.  Right Ear: External ear normal.  Left Ear: External ear normal.  Nose: Nose normal.  Eyes: Right eye exhibits no discharge. Left eye exhibits no  discharge.  Neck: Neck supple.  Cardiovascular: Normal rate, regular rhythm, normal heart sounds and intact distal pulses.   Pulses:      Radial pulses are 2+ on the right side, and 2+ on the left side.  Pulmonary/Chest: Effort normal. He exhibits tenderness.    Abdominal: He exhibits no distension.  Musculoskeletal: He exhibits no edema.       Left shoulder: He exhibits decreased range of motion (due to pain in chest). He exhibits no tenderness.       Left upper arm: He exhibits no tenderness.  Neurological: He is alert and oriented to person, place, and time.  Skin: Skin is warm and dry.    ED Course  Procedures (including critical care time) Labs Review Labs Reviewed - No data to display Imaging Review Dg Clavicle Left  06/24/2013   CLINICAL DATA:  [place] this morning, pain radiates from left clavicle to left arm  EXAM: LEFT CLAVICLE - 2+ VIEWS  COMPARISON:  None.  FINDINGS: There is a comminuted fracture of the lateral of left clavicle at the junction of the middle and distal thirds. There is apex cranial angulation. The proximal fracture fragment is displaced 1 shaft with cranially relative to the distal fracture fragment.  IMPRESSION: Comminuted and displaced clavicular fracture   Electronically Signed   By: Skipper Cliche M.D.   On: 06/24/2013 10:33     EKG Interpretation None  MDM   Final diagnoses:  Fracture of left clavicle    Patient neurovascularly intact. Pain controlled in ED. It was ambulated without assistance and does not appear acutely intoxicated. We'll place a shoulder sling and give pain control and ortho followup. No other obvious injuries.    Ephraim Hamburger, MD 06/24/13 [phone]

## 2013-06-24 NOTE — ED Notes (Signed)
Bed: [facility] [place] [facility] Expected date: 06/24/13 Expected time: 9:31 AM Means of arrival:  Comments: Fall, shoulder injury, ETOH

## 2013-06-24 NOTE — Discharge Instructions (Signed)
Clavicle Fracture °A clavicle fracture is a break in the collarbone. This is a common injury, especially in children. Collarbones do not harden until around the age of 20. Most collarbone fractures are treated with a simple arm sling. In some cases a figure-of-eight splint is used to help hold the broken bones in position. Although not often needed, surgery may be required if the bone fragments are not in the correct position (displaced).  °HOME CARE INSTRUCTIONS  °· Apply ice to the injury for 15-20 minutes each hour while awake for 2 days. Put the ice in a plastic bag and place a towel between the bag of ice and your skin. °· Wear the sling or splint constantly for as long as directed by your caregiver. You may remove the sling or splint for bathing or showering. Be sure to keep your shoulder in the same place as when the sling or splint is on. Do not lift your arm. °· If a figure-of-eight splint is applied, it must be tightened by another person every day. Tighten it enough to keep the shoulders held back. Allow enough room to place the index finger between the body and strap. Loosen the splint immediately if you feel numbness or tingling in your hands. °· Only take over-the-counter or prescription medicines for pain, discomfort, or fever as directed by your caregiver. °· Avoid activities that irritate or increase the pain for 4 to 6 weeks after surgery. °· Follow all instructions for follow-up with your caregiver. This includes any referrals, physical therapy, and rehabilitation. Any delay in obtaining necessary care could result in a delay or failure of the injury to heal properly. °SEEK MEDICAL CARE IF:  °You have pain and swelling that are not relieved with medications. °SEEK IMMEDIATE MEDICAL CARE IF:  °Your arm is numb, cold, or pale, even when the splint is loose. °MAKE SURE YOU:  °· Understand these instructions. °· Will watch your condition. °· Will get help right away if you are not doing well or get  worse. °Document Released: 01/21/2005 Document Revised: 07/06/2011 Document Reviewed: 11/17/2007 °[employer]® Patient Information ©2014 [employer]. ° °

## 2014-08-04 ENCOUNTER — Emergency Department (HOSPITAL_COMMUNITY)
Admission: EM | Admit: 2014-08-04 | Discharge: 2014-08-05 | Disposition: A | Discharge status: Home / Self Care | Payer: Self-pay | Attending: Emergency Medicine | Admitting: Emergency Medicine

## 2014-08-04 ENCOUNTER — Encounter (HOSPITAL_COMMUNITY): Payer: Self-pay | Admitting: *Deleted

## 2014-08-04 ENCOUNTER — Emergency Department (HOSPITAL_COMMUNITY): Payer: Self-pay

## 2014-08-04 DIAGNOSIS — R109 Unspecified abdominal pain: Secondary | ICD-10-CM | POA: Insufficient documentation

## 2014-08-04 DIAGNOSIS — R52 Pain, unspecified: Secondary | ICD-10-CM

## 2014-08-04 DIAGNOSIS — Z8701 Personal history of pneumonia (recurrent): Secondary | ICD-10-CM | POA: Insufficient documentation

## 2014-08-04 DIAGNOSIS — F10120 Alcohol abuse with intoxication, uncomplicated: Secondary | ICD-10-CM | POA: Insufficient documentation

## 2014-08-04 DIAGNOSIS — F10920 Alcohol use, unspecified with intoxication, uncomplicated: Secondary | ICD-10-CM

## 2014-08-04 LAB — URINALYSIS, ROUTINE W REFLEX MICROSCOPIC
Bilirubin Urine: NEGATIVE
GLUCOSE, UA: NEGATIVE mg/dL
Hgb urine dipstick: NEGATIVE
KETONES UR: NEGATIVE mg/dL
LEUKOCYTES UA: NEGATIVE
Nitrite: NEGATIVE
PH: 5 (ref 5.0–8.0)
PROTEIN: NEGATIVE mg/dL
Specific Gravity, Urine: 1.016 (ref 1.005–1.030)
Urobilinogen, UA: 0.2 mg/dL (ref 0.0–1.0)

## 2014-08-04 LAB — CBC
HEMATOCRIT: 42.8 % (ref 39.0–52.0)
Hemoglobin: 14.4 g/dL (ref 13.0–17.0)
MCH: 32.1 pg (ref 26.0–34.0)
MCHC: 33.6 g/dL (ref 30.0–36.0)
MCV: 95.5 fL (ref 78.0–100.0)
Platelets: 212 10*3/uL (ref 150–400)
RBC: 4.48 MIL/uL (ref 4.22–5.81)
RDW: 13.6 % (ref 11.5–15.5)
WBC: 4.1 10*3/uL (ref 4.0–10.5)

## 2014-08-04 LAB — RAPID URINE DRUG SCREEN, HOSP PERFORMED
AMPHETAMINES: NOT DETECTED
Barbiturates: NOT DETECTED
Benzodiazepines: NOT DETECTED
Cocaine: NOT DETECTED
OPIATES: NOT DETECTED
TETRAHYDROCANNABINOL: NOT DETECTED

## 2014-08-04 LAB — COMPREHENSIVE METABOLIC PANEL
ALT: 85 U/L — AB (ref 0–53)
ANION GAP: 16 — AB (ref 5–15)
AST: 123 U/L — AB (ref 0–37)
Albumin: 4.9 g/dL (ref 3.5–5.2)
Alkaline Phosphatase: 107 U/L (ref 39–117)
BILIRUBIN TOTAL: 0.5 mg/dL (ref 0.3–1.2)
BUN: 7 mg/dL (ref 6–23)
CHLORIDE: 102 mmol/L (ref 96–112)
CO2: 21 mmol/L (ref 19–32)
Calcium: 9.3 mg/dL (ref 8.4–10.5)
Creatinine, Ser: 0.78 mg/dL (ref 0.50–1.35)
GFR calc Af Amer: >90 mL/min (ref >90)
Glucose, Bld: 154 mg/dL — ABNORMAL HIGH (ref 70–99)
Potassium: 3.6 mmol/L (ref 3.5–5.1)
SODIUM: 139 mmol/L (ref 135–145)
Total Protein: 8.6 g/dL — ABNORMAL HIGH (ref 6.0–8.3)

## 2014-08-04 LAB — LIPASE, BLOOD: LIPASE: 34 U/L (ref 11–59)

## 2014-08-04 LAB — ETHANOL: Alcohol, Ethyl (B): 446 mg/dL (ref 0–9)

## 2014-08-04 MED ORDER — SODIUM CHLORIDE 0.9 % IV BOLUS (SEPSIS)
1000.0000 mL | Freq: Once | INTRAVENOUS | Status: AC
  Administered 2014-08-04: 1000 mL via INTRAVENOUS

## 2014-08-04 NOTE — ED Notes (Signed)
Bed: TO67 Expected date:  Expected time:  Means of arrival:  Comments: Hold for Janssen, EDP at bedside

## 2014-08-04 NOTE — Discharge Instructions (Signed)
It was our pleasure to provide your ER care today - we hope that you feel better.  Do not drink alcohol.  Follow up with AA, and use resource guide provided for additional community resources.  Return to ER if worse, new symptoms, fevers, severe pain, chest pain, trouble breathing, other concern.      Alcohol Intoxication Alcohol intoxication occurs when the amount of alcohol that a person has consumed impairs his or her ability to mentally and physically function. Alcohol directly impairs the normal chemical activity of the brain. Drinking large amounts of alcohol can lead to changes in mental function and behavior, and it can cause many physical effects that can be harmful.  Alcohol intoxication can range in severity from mild to very severe. Various factors can affect the level of intoxication that occurs, such as the person's age, gender, weight, frequency of alcohol consumption, and the presence of other medical conditions (such as diabetes, seizures, or heart conditions). Dangerous levels of alcohol intoxication may occur when people drink large amounts of alcohol in a short period (binge drinking). Alcohol can also be especially dangerous when combined with certain prescription medicines or "recreational" drugs. SIGNS AND SYMPTOMS Some common signs and symptoms of mild alcohol intoxication include:  Loss of coordination.  Changes in mood and behavior.  Impaired judgment.  Slurred speech. As alcohol intoxication progresses to more severe levels, other signs and symptoms will appear. These may include:  Vomiting.  Confusion and impaired memory.  Slowed breathing.  Seizures.  Loss of consciousness. DIAGNOSIS  Your health care provider will take a medical history and perform a physical exam. You will be asked about the amount and type of alcohol you have consumed. Blood tests will be done to measure the concentration of alcohol in your blood. In many places, your blood alcohol  level must be lower than 80 mg/dL (0.08%) to legally drive. However, many dangerous effects of alcohol can occur at much lower levels.  TREATMENT  People with alcohol intoxication often do not require treatment. Most of the effects of alcohol intoxication are temporary, and they go away as the alcohol naturally leaves the body. Your health care provider will monitor your condition until you are stable enough to go home. Fluids are sometimes given through an IV access tube to help prevent dehydration.  HOME CARE INSTRUCTIONS  Do not drive after drinking alcohol.  Stay hydrated. Drink enough water and fluids to keep your urine clear or pale yellow. Avoid caffeine.   Only take over-the-counter or prescription medicines as directed by your health care provider.  SEEK MEDICAL CARE IF:   You have persistent vomiting.   You do not feel better after a few days.  You have frequent alcohol intoxication. Your health care provider can help determine if you should see a substance use treatment counselor. SEEK IMMEDIATE MEDICAL CARE IF:   You become shaky or tremble when you try to stop drinking.   You shake uncontrollably (seizure).   You throw up (vomit) blood. This may be bright red or may look like black coffee grounds.   You have blood in your stool. This may be bright red or may appear as a black, tarry, bad smelling stool.   You become lightheaded or faint.  MAKE SURE YOU:   Understand these instructions.  Will watch your condition.  Will get help right away if you are not doing well or get worse. Document Released: 01/21/2005 Document Revised: 12/14/2012 Document Reviewed: 09/16/2012 ExitCare Patient Information 2015  [employer]. This information is not intended to replace advice given to you by your health care provider. Make sure you discuss any questions you have with your health care provider.    Alcohol Use Disorder Alcohol use disorder is a mental disorder. It is  not a one-time incident of heavy drinking. Alcohol use disorder is the excessive and uncontrollable use of alcohol over time that leads to problems with functioning in one or more areas of daily living. People with this disorder risk harming themselves and others when they drink to excess. Alcohol use disorder also can cause other mental disorders, such as mood and anxiety disorders, and serious physical problems. People with alcohol use disorder often misuse other drugs.  Alcohol use disorder is common and widespread. Some people with this disorder drink alcohol to cope with or escape from negative life events. Others drink to relieve chronic pain or symptoms of mental illness. People with a family history of alcohol use disorder are at higher risk of losing control and using alcohol to excess.  SYMPTOMS  Signs and symptoms of alcohol use disorder may include the following:   Consumption ofalcohol inlarger amounts or over a longer period of time than intended.  Multiple unsuccessful attempts to cutdown or control alcohol use.   A great deal of time spent obtaining alcohol, using alcohol, or recovering from the effects of alcohol (hangover).  A strong desire or urge to use alcohol (cravings).   Continued use of alcohol despite problems at work, school, or home because of alcohol use.   Continued use of alcohol despite problems in relationships because of alcohol use.  Continued use of alcohol in situations when it is physically hazardous, such as driving a car.  Continued use of alcohol despite awareness of a physical or psychological problem that is likely related to alcohol use. Physical problems related to alcohol use can involve the brain, heart, liver, stomach, and intestines. Psychological problems related to alcohol use include intoxication, depression, anxiety, psychosis, delirium, and dementia.   The need for increased amounts of alcohol to achieve the same desired effect, or a  decreased effect from the consumption of the same amount of alcohol (tolerance).  Withdrawal symptoms upon reducing or stopping alcohol use, or alcohol use to reduce or avoid withdrawal symptoms. Withdrawal symptoms include:  Racing heart.  Hand tremor.  Difficulty sleeping.  Nausea.  Vomiting.  Hallucinations.  Restlessness.  Seizures. DIAGNOSIS Alcohol use disorder is diagnosed through an assessment by your health care provider. Your health care provider may start by asking three or four questions to screen for excessive or problematic alcohol use. To confirm a diagnosis of alcohol use disorder, at least two symptoms must be present within a 92-month period. The severity of alcohol use disorder depends on the number of symptoms:  Mild--two or three.  Moderate--four or five.  Severe--six or more. Your health care provider may perform a physical exam or use results from lab tests to see if you have physical problems resulting from alcohol use. Your health care provider may refer you to a mental health professional for evaluation. TREATMENT  Some people with alcohol use disorder are able to reduce their alcohol use to low-risk levels. Some people with alcohol use disorder need to quit drinking alcohol. When necessary, mental health professionals with specialized training in substance use treatment can help. Your health care provider can help you decide how severe your alcohol use disorder is and what type of treatment you need. The following forms  of treatment are available:   Detoxification. Detoxification involves the use of prescription medicines to prevent alcohol withdrawal symptoms in the first week after quitting. This is important for people with a history of symptoms of withdrawal and for heavy drinkers who are likely to have withdrawal symptoms. Alcohol withdrawal can be dangerous and, in severe cases, cause death. Detoxification is usually provided in a hospital or in-patient  substance use treatment facility.  Counseling or talk therapy. Talk therapy is provided by substance use treatment counselors. It addresses the reasons people use alcohol and ways to keep them from drinking again. The goals of talk therapy are to help people with alcohol use disorder find healthy activities and ways to cope with life stress, to identify and avoid triggers for alcohol use, and to handle cravings, which can cause relapse.  Medicines.Different medicines can help treat alcohol use disorder through the following actions:  Decrease alcohol cravings.  Decrease the positive reward response felt from alcohol use.  Produce an uncomfortable physical reaction when alcohol is used (aversion therapy).  Support groups. Support groups are run by people who have quit drinking. They provide emotional support, advice, and guidance. These forms of treatment are often combined. Some people with alcohol use disorder benefit from intensive combination treatment provided by specialized substance use treatment centers. Both inpatient and outpatient treatment programs are available. Document Released: 05/21/2004 Document Revised: 08/28/2013 Document Reviewed: 07/21/2012 [facility] Patient Information 2015 [place], [place]. This information is not intended to replace advice given to you by your health care provider. Make sure you discuss any questions you have with your health care provider.       Emergency Department Resource Guide 1) Find a Doctor and Pay Out of Pocket Although you won't have to find out who is covered by your insurance plan, it is a good idea to ask around and get recommendations. You will then need to call the office and see if the doctor you have chosen will accept you as a new patient and what types of options they offer for patients who are self-pay. Some doctors offer discounts or will set up payment plans for their patients who do not have insurance, but you will need to ask so  you aren't surprised when you get to your appointment.  2) Contact Your Local Health Department Not all health departments have doctors that can see patients for sick visits, but many do, so it is worth a call to see if yours does. If you don't know where your local health department is, you can check in your phone book. The CDC also has a tool to help you locate your state's health department, and many state websites also have listings of all of their local health departments.  3) Find a [facility] If your illness is not likely to be very severe or complicated, you may want to try a walk in clinic. These are popping up all over the country in pharmacies, drugstores, and shopping centers. They're usually staffed by nurse practitioners or physician assistants that have been trained to treat common illnesses and complaints. They're usually fairly quick and inexpensive. However, if you have serious medical issues or chronic medical problems, these are probably not your best option.  No Primary Care Doctor: - Call Health Connect at  [phone] - they can help you locate a primary care doctor that  accepts your insurance, provides certain services, etc. - Physician Referral Service- [phone]  Chronic Pain Problems: Organization  Address  Phone   Notes  [facility]  [phone] Patients need to be referred by their primary care doctor.   Medication Assistance: Organization         Address  Phone   Notes  [facility] Medication [facility] [address] [place], [place] [address] [phone] --Must be a resident of [facility] -- Must have NO insurance coverage whatsoever (no Medicaid/ Medicare, etc.) -- The pt. MUST have a primary care doctor that directs their care regularly and follows them in the community   MedAssist  [phone]   [employer]  [phone]    Agencies that provide inexpensive medical  care: Organization         Address  Phone   Notes  [place]  [phone]   [facility]    [phone]   [facility] [place], [place] [address] [phone]   [place] [address] [place] [phone]   Planned Parenthood    [phone]   [facility]    [phone]   [place] and [place], [place] Phone:  [phone], Fax:  [phone] Hours of Operation:  9 am - 6 pm, M-F.  Also accepts Medicaid/Medicare and self-pay.  [facility] for [place], [address], [place] Phone: [phone], Fax: [phone]. Hours of Operation:  8:30 am - 5:30 pm, M-F.  Also accepts Medicaid and self-pay.  [facility] [place] [address], [place] Phone: [phone]   [place], [place], [place] [phone], Ext. [phone] Mondays & Thursdays: 7-9 AM.  First 15 patients are seen on a first come, first serve basis.    [place] Providers:  Organization         Address  Phone   Notes  [facility] [address], [place] [phone] Also accepts self-pay patients.  [facility] [address], [place]  [phone]   [place], [address], [place] [phone]   [facility] [address], [place] [phone]   Lucianne Lei [address], [place]   [phone] Only accepts [place] Access [place] patients after they have their name applied to their card.   Self-Pay (no insurance) in [facility]:  Organization         Address  Phone   Notes  Sickle Cell Patients, [facility] Internal Medicine [place] [phone]   [facility] [place] [phone]   [facility] [place]  [place], [place], [place] [phone]   [facility]/Dr. Osei-Bonsu  [address] [place] or [address], [place] [phone] Phone number for both [place] and [place] locations is the same.  Urgent Medical and [facility] [address] [place] [phone]   [facility] [address], [place] or [address] [phone]   [facility] [address], [place] [phone], phone; [phone], fax Sees patients 1st and 3rd Saturday of every month.  Must not qualify  for public or private insurance (i.e. Medicaid, Medicare, [place] Health Choice, Veterans' Benefits)  Household income should be no more than 200% of the poverty level The clinic cannot treat you if you are pregnant or think you are pregnant  Sexually transmitted diseases are not treated at the clinic.    Dental Care: Organization         Address  Phone  Notes  [facility] Department of [facility] [place] [phone] Accepts children up to age 41 who are enrolled in [place] or [place]; pregnant women with a Medicaid card; and children who have applied for Medicaid or [place] Health Choice, but were declined, whose parents can pay a reduced fee at time of service.  [facility] Department of [facility]  [address], [place] [phone] Accepts children up to age 15 who are enrolled in [place] or [place]; pregnant women with a Medicaid card; and children who have applied for Medicaid or [place] Health Choice, but were declined, whose parents can pay a reduced fee at time of service.  [place] Adult [facility] Access PRO[facility]  [place] [phone] Patients are seen by appointment only. Walk-ins are not accepted. [place] will see patients 6 years of age and older. Monday - Tuesday (8am-5pm) Most Wednesdays (8:30-5pm) $30 per visit, cash only  [facility]  [address], [facility] [phone] Patients are seen by appointment only. Walk-ins are not accepted. [place] will see patients 25 years of age and older. One Wednesday Evening (Monthly: Volunteer Based).  $30 per visit, cash only  [place]  [phone] for adults; Children under age 27, call Graduate Pediatric Dentistry at [phone]. Children aged 54-14, please call [phone] to request a pediatric application.  Dental services are provided in all areas of dental care including fillings, crowns and bridges, complete and partial dentures, implants, gum treatment, root canals, and extractions. Preventive care is also provided. Treatment is provided to both adults and children. Patients are selected via a lottery and there is often a waiting list.   [facility] [address] [place]  [phone] [email or web address]   [facility] [address] [place], [place] [phone], Ext. [phone] Second and Fourth Thursday of each month, opens at 6:30 AM; Clinic ends at 9 AM.  Patients are seen on a first-come first-served basis, and a limited number are seen during each clinic.   [facility]  [address] Hillard Danker [place], [place] [phone]   Eligibility Requirements You must have lived in [place], [place], or [place] counties for at least the last three months.   You cannot be eligible for state or federal sponsored [employer], including [employer], [place], or [employer].   You generally cannot be eligible for healthcare insurance through your employer.    How to apply: Eligibility screenings are held every Tuesday and Wednesday afternoon from 1:00 pm until 4:00 pm. You do not need an appointment for the interview!   [facility] [address], [place], [place]   [place]  [place] Department  [place]  [phone]    Behavioral Health Resources in the Community: Intensive Outpatient Programs Organization         Address  Phone  Notes  [facility] [address] [place], [place] [phone]   [facility] Outpatient [address], [place], [place]   ADS: [facility] [address] [place], [place]   [place] [address]  [place], [place] or [phone]   Substance Abuse Resources Organization         Address  Phone  Notes  Alcohol [facility] Drug Services  [phone]   [place]  [phone]   [facility] [place]   Chinita Pester  [phone]   Residential & Outpatient Substance Abuse Program  [phone]   Psychological Services Organization         Address  Phone  Notes  [facility] [place]  [place]  [phone]   [place] [address], [place] or [phone]    Mobile Crisis Teams Organization         Address  Phone  Notes  [facility], Mobile Crisis Care Unit  [phone]   [facility]  [address] [place], [place]   [place] [address], [place] [phone]    Self-Help/Support Groups Organization         Address  Phone             Notes  [place]. of Fate - variety of support groups  [place] Call for more information  Narcotics Anonymous (NA), [facility] [address], [employer] [place]  2 meetings at this location   Special educational needs teacher         Address  Phone  Notes  ASAP Residential Treatment [place],    [place]  [phone]   [facility]  [address] [place] [address], [place], [place]   [place], [place] [phone] Admissions: 8am-3pm M-F  Incentives Substance [place] [address]    [place], [place] [phone]   [facility] [address] [place], [place], [place]   [facility] [address],  [place], [place]   [facility] - Intensive Outpatient [address] Kristeen Mans [address], [place], [place]   [facility] ([place].,  [place], [place] [phone] or [phone]   [facility] (RTS) [address] [place], [place] Accepts Medicaid  [facility] [place] [address]  [place] [phone] Substance Abuse/Addiction Treatment   [facility] Organization         Address  Phone  Notes  [facility]  [phone]   Domenic Schwab, PhD [address] Arlis Porta [place], [place]   [phone] or [phone]   [place]   [address] [place], [place] [phone]   [facility] [address] [place], [place] [phone] Insurance/Medicaid/sponsorship through [facility] and Families [address] [place]                                    [place], [place] [phone] [place] [address], [place] [phone]    Dr. Adele Schilder  [phone]   [facility] [place]  [facility]. 1) [address], [place] 2) [place] 3)  [place] [address], [place] [phone]  [phone]   [place] [phone] or [phone] (After Hours)

## 2014-08-04 NOTE — ED Notes (Signed)
Pt redirected and told again the we need a UA.  He said he'd be happy to give one but doesn't think he can yet.    Pt threw away sample when he first got here.
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
## 2014-08-04 NOTE — ED Notes (Signed)
Nurse starting IV and will get labs. 

## 2014-08-04 NOTE — ED Notes (Signed)
Patient transported to X-ray 

## 2014-08-05 NOTE — ED Provider Notes (Signed)
Pt was asleep, easily awakened. States he is feeling okay and is ready to be discharged. Pt is steady and ambulatory.    Rolland Porter, MD, Barbette Or, MD 08/05/14 [phone]

## 2015-01-25 ENCOUNTER — Emergency Department (HOSPITAL_COMMUNITY): Payer: Self-pay

## 2015-01-25 ENCOUNTER — Encounter (HOSPITAL_COMMUNITY): Payer: Self-pay | Admitting: Emergency Medicine

## 2015-01-25 ENCOUNTER — Emergency Department (HOSPITAL_COMMUNITY)
Admission: EM | Admit: 2015-01-25 | Discharge: 2015-01-26 | Disposition: A | Discharge status: Other Acute Inpatient Hospital | Payer: Federal, State, Local not specified - Other | Attending: Emergency Medicine | Admitting: Emergency Medicine

## 2015-01-25 DIAGNOSIS — F329 Major depressive disorder, single episode, unspecified: Secondary | ICD-10-CM | POA: Insufficient documentation

## 2015-01-25 DIAGNOSIS — R109 Unspecified abdominal pain: Secondary | ICD-10-CM

## 2015-01-25 DIAGNOSIS — R45851 Suicidal ideations: Secondary | ICD-10-CM

## 2015-01-25 DIAGNOSIS — F1310 Sedative, hypnotic or anxiolytic abuse, uncomplicated: Secondary | ICD-10-CM | POA: Insufficient documentation

## 2015-01-25 DIAGNOSIS — Z8701 Personal history of pneumonia (recurrent): Secondary | ICD-10-CM | POA: Insufficient documentation

## 2015-01-25 DIAGNOSIS — R1012 Left upper quadrant pain: Secondary | ICD-10-CM | POA: Insufficient documentation

## 2015-01-25 DIAGNOSIS — F32A Depression, unspecified: Secondary | ICD-10-CM

## 2015-01-25 DIAGNOSIS — F1011 Alcohol abuse, in remission: Secondary | ICD-10-CM

## 2015-01-25 DIAGNOSIS — R1032 Left lower quadrant pain: Secondary | ICD-10-CM | POA: Insufficient documentation

## 2015-01-25 DIAGNOSIS — D696 Thrombocytopenia, unspecified: Secondary | ICD-10-CM | POA: Insufficient documentation

## 2015-01-25 DIAGNOSIS — F322 Major depressive disorder, single episode, severe without psychotic features: Secondary | ICD-10-CM | POA: Diagnosis present

## 2015-01-25 LAB — COMPREHENSIVE METABOLIC PANEL
ALT: 60 U/L (ref 17–63)
ANION GAP: 8 (ref 5–15)
AST: 86 U/L — ABNORMAL HIGH (ref 15–41)
Albumin: 3.5 g/dL (ref 3.5–5.0)
Alkaline Phosphatase: 193 U/L — ABNORMAL HIGH (ref 38–126)
BILIRUBIN TOTAL: 1.9 mg/dL — AB (ref 0.3–1.2)
BUN: 6 mg/dL (ref 6–20)
CO2: 26 mmol/L (ref 22–32)
Calcium: 8.8 mg/dL — ABNORMAL LOW (ref 8.9–10.3)
Chloride: 102 mmol/L (ref 101–111)
Creatinine, Ser: 0.58 mg/dL — ABNORMAL LOW (ref 0.61–1.24)
GFR calc Af Amer: >60 mL/min (ref >60)
GFR calc non Af Amer: >60 mL/min (ref >60)
Glucose, Bld: 154 mg/dL — ABNORMAL HIGH (ref 65–99)
Potassium: 3.2 mmol/L — ABNORMAL LOW (ref 3.5–5.1)
Sodium: 136 mmol/L (ref 135–145)
TOTAL PROTEIN: 7.6 g/dL (ref 6.5–8.1)

## 2015-01-25 LAB — CBC
HCT: 32.4 % — ABNORMAL LOW (ref 39.0–52.0)
Hemoglobin: 10.8 g/dL — ABNORMAL LOW (ref 13.0–17.0)
MCH: 33.4 pg (ref 26.0–34.0)
MCHC: 33.3 g/dL (ref 30.0–36.0)
MCV: 100.3 fL — ABNORMAL HIGH (ref 78.0–100.0)
Platelets: 96 10*3/uL — ABNORMAL LOW (ref 150–400)
RBC: 3.23 MIL/uL — ABNORMAL LOW (ref 4.22–5.81)
RDW: 12.4 % (ref 11.5–15.5)
WBC: 6.3 10*3/uL (ref 4.0–10.5)

## 2015-01-25 LAB — RAPID URINE DRUG SCREEN, HOSP PERFORMED
Amphetamines: NOT DETECTED
Barbiturates: NOT DETECTED
Benzodiazepines: POSITIVE — AB
Cocaine: NOT DETECTED
Opiates: NOT DETECTED
Tetrahydrocannabinol: NOT DETECTED

## 2015-01-25 LAB — SALICYLATE LEVEL

## 2015-01-25 LAB — ETHANOL

## 2015-01-25 LAB — LIPASE, BLOOD: LIPASE: 41 U/L (ref 22–51)

## 2015-01-25 LAB — ACETAMINOPHEN LEVEL

## 2015-01-25 MED ORDER — ONDANSETRON HCL 4 MG PO TABS: 4.0000 mg | ORAL_TABLET | Freq: Three times a day (TID) | ORAL | Status: DC | PRN

## 2015-01-25 MED ORDER — ACETAMINOPHEN 325 MG PO TABS: 650.0000 mg | ORAL_TABLET | ORAL | Status: DC | PRN

## 2015-01-25 MED ORDER — IBUPROFEN 200 MG PO TABS: 600.0000 mg | ORAL_TABLET | Freq: Three times a day (TID) | ORAL | Status: DC | PRN

## 2015-01-25 MED ORDER — IOHEXOL 300 MG/ML  SOLN
25.0000 mL | Freq: Once | INTRAMUSCULAR | Status: AC | PRN
  Administered 2015-01-25: 25 mL via ORAL

## 2015-01-25 MED ORDER — ALUM & MAG HYDROXIDE-SIMETH 200-200-20 MG/5ML PO SUSP: 30.0000 mL | ORAL | Status: DC | PRN

## 2015-01-25 MED ORDER — IOHEXOL 300 MG/ML  SOLN
100.0000 mL | Freq: Once | INTRAMUSCULAR | Status: AC | PRN
  Administered 2015-01-25: 100 mL via INTRAVENOUS

## 2015-01-25 MED ORDER — NICOTINE 21 MG/24HR TD PT24: 21.0000 mg | MEDICATED_PATCH | Freq: Every day | TRANSDERMAL | Status: DC

## 2015-01-25 MED ORDER — ZOLPIDEM TARTRATE 5 MG PO TABS: 5.0000 mg | ORAL_TABLET | Freq: Every evening | ORAL | Status: DC | PRN

## 2015-01-25 MED ORDER — LORAZEPAM 1 MG PO TABS: 1.0000 mg | ORAL_TABLET | Freq: Three times a day (TID) | ORAL | Status: DC | PRN

## 2015-01-25 NOTE — Progress Notes (Signed)
Referral made to: [facility] - referral under review. [facility] per Olivia Mackie, fax referral, beds open. [facility] - per intake, fax referral for the waitlist. [facility] - per Helene Kelp, geriatric beds open, fax referral. [facility] per [facility], fax over referral, beds open. [facility] per Marcie Bal, fax referral, gero beds.  Verlon Setting, [facility] Disposition staff 01/25/2015 11:14 PM

## 2015-01-25 NOTE — ED Provider Notes (Signed)
CSN: [id number]     Arrival date & time 01/25/15  1502 History   First MD Initiated Contact with Patient 01/25/15 [phone]     Chief Complaint  Patient presents with  . Suicidal  . Depression  . Abdominal Pain     (Consider location/radiation/quality/duration/timing/severity/associated sxs/prior Treatment) HPI  PCP: No primary care provider on file. PMH: pneumonia  LEVEL V CAVEAT- uncooperative due to patient having flat affect and not wanting to talk.  Chase Parsons is a 59 y.o.  male  CHIEF COMPLAINT: here with [facility] With complaints of abdominal pain, N/V and depression, vague SI complaints  Patient doesn't offer up much information but his representative from [facility] is here and they have talked in depth.  The pt is homeless and today after having feelings of wanting to harm himself reached out to psychiatric services. He has been having insomnia, outbursts of anger, depression, vague SI that is triggered by his situation and by people touching his things. It is difficult to get information from him. 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   Abdominal Pain Time of onset: a few months ago        Quality:  fullness        Severity: patient will not answer        Progression:  worsening Chronicity: chronic a few months, never had problems with pain before then Risk factors: hx of alcoholism Treatments tried:  none Relieved by: nothing Worsened by: nothing   Past Medical History  Diagnosis Date  . Pneumonia    History reviewed. No pertinent past surgical history. History reviewed. No pertinent family history. Social History  Substance Use Topics  . Smoking status: Never Smoker   . Smokeless tobacco: None  . Alcohol Use: No    Review of Systems  LEVEL V CAVEAT- uncooperative due to patient having flat affect and not wanting to talk.  Allergies  Review of patient's allergies indicates no known allergies.  Home  Medications   Prior to Admission medications   Medication Sig Start Date End Date Taking? Authorizing Provider  HYDROcodone-acetaminophen (NORCO) 5-325 MG per tablet Take 1-2 tablets by mouth every 4 (four) hours as needed. Patient not taking: Reported on 08/04/2014 06/24/13   Sherwood Gambler, MD   BP 158/110 mmHg  Pulse 92  Temp(Src) 97.5 F (36.4 C) (Oral)  Resp 16  Ht _0  (1.778 m)  Wt 135 lb (61.236 kg)  BMI 19.37 kg/m2  SpO2 100% Physical Exam  Constitutional: He appears well-developed and well-nourished. No distress.  HENT:  Head: Normocephalic and atraumatic.  Eyes: Pupils are equal, round, and reactive to light.  Neck: Normal range of motion. Neck supple.  Cardiovascular: Normal rate and regular rhythm.   Pulmonary/Chest: Effort normal and breath sounds normal.  Abdominal: Soft. Bowel sounds are normal. He exhibits no distension. There is tenderness in the left upper quadrant and left lower quadrant. There is guarding. There is no rigidity, no rebound, no CVA tenderness and negative Murphy's sign.  Neurological: He is alert.  Skin: Skin is warm and dry.  Psychiatric: He is slowed. He exhibits a depressed mood. He expresses suicidal ideation. He expresses no homicidal ideation. He expresses no suicidal plans and no homicidal plans.  Flat affect Complaints of vague hallucinations of shapes and lines  Nursing note and vitals reviewed.   ED Course  Procedures (including critical care time) Labs Review Labs Reviewed  COMPREHENSIVE METABOLIC PANEL - Abnormal; Notable  for the following:    Potassium 3.2 (*)    Glucose, Bld 154 (*)    Creatinine, Ser 0.58 (*)    Calcium 8.8 (*)    AST 86 (*)    Alkaline Phosphatase 193 (*)    Total Bilirubin 1.9 (*)    All other components within normal limits  ACETAMINOPHEN LEVEL - Abnormal; Notable for the following:    Acetaminophen (Tylenol), Serum <10 (*)    All other components within normal limits  CBC - Abnormal; Notable for  the following:    RBC 3.23 (*)    Hemoglobin 10.8 (*)    HCT 32.4 (*)    MCV 100.3 (*)    Platelets 96 (*)    All other components within normal limits  ETHANOL  SALICYLATE LEVEL  LIPASE, BLOOD  URINE RAPID DRUG SCREEN, HOSP PERFORMED    Imaging Review Ct Abdomen Pelvis W Contrast  01/25/2015   CLINICAL DATA:  Acute onset of lower abdominal pain, nausea and vomiting. Initial encounter.  EXAM: CT ABDOMEN AND PELVIS WITH CONTRAST  TECHNIQUE: Multidetector CT imaging of the abdomen and pelvis was performed using the standard protocol following bolus administration of intravenous contrast.  CONTRAST:  149m OMNIPAQUE IOHEXOL 300 MG/ML  SOLN  COMPARISON:  CT of the abdomen and pelvis from 07/24/2011  FINDINGS: The visualized lung bases are clear.  The liver and spleen are unremarkable in appearance. Decreased attenuation adjacent to the gallbladder fossa is thought to reflect fatty infiltration. The gallbladder is within normal limits. The pancreas and adrenal glands are unremarkable.  The kidneys are unremarkable in appearance. There is no evidence of hydronephrosis. No renal or ureteral stones are seen. No perinephric stranding is appreciated.  No free fluid is identified. The small bowel is unremarkable in appearance. The stomach is within normal limits. No acute vascular abnormalities are seen.  The appendix is normal in caliber and contains air, without evidence of appendicitis. Scattered diverticulosis is noted along the descending colon, without evidence of diverticulitis.  The bladder is moderately distended and grossly unremarkable in appearance. The prostate is borderline normal in size No inguinal lymphadenopathy is seen.  No acute osseous abnormalities are identified.  IMPRESSION: 1. No acute abnormality seen within the abdomen or pelvis. 2. Scattered diverticulosis along the descending colon, without evidence of diverticulitis.   Electronically Signed   By: JGarald BaldingM.D.   On: 01/25/2015  18:40   I have personally reviewed and evaluated these images and lab results as part of my medical decision-making.   EKG Interpretation None      MDM   Final diagnoses:  History of alcohol abuse  Depression  Suicidal thoughts  Abdominal pain, unspecified abdominal location  Thrombocytopenia    CMP shows low potassium at 3.2 - replaced Elevated Alk Phos,lipase  and Bilirubin at 1.9 Normal ETOH, has not had alcohol for greater than 5 months Neg acetaminophen and salicylate CBC shows mildly lowered HBG  CT abd pelvis shows diffuse diverticulosis but not diverticulitis.  Per CT no abnormalities associated with the liver, gall bladder, or appendix. No acute abnormalities noted.  Filed Vitals:   01/25/15 1756  BP: 158/110  Pulse: 92  Temp: 97.5 F (36.4 C)  Resp: 16    Pt medically cleared at this time, his belly pain may be psychogenic verse other nonacute pathology Holding labs ordered, home meds reviewed. TTS consult ordered   TDelos Haring PA-C 01/25/15 1849   TTS feels pt meets criteria for inpatient treatment.  Will look for bed assignment  Delos Haring, PA-C 01/25/15 2110  Wandra Arthurs, MD 01/25/15 [phone]

## 2015-01-25 NOTE — ED Notes (Addendum)
Patient resting in room, watching tv with sitter at bedside.

## 2015-01-25 NOTE — ED Notes (Signed)
Pt came into ER with [facility] careperson by his side.  He is complaining of lower abdominal pain that had spells of nausea/vomiting.  Pt denies thought of hurting himself or others while in my presence.  However he admits to fleeting thoughts of hurting himself and others which is what precipitated [facility]'s involvement in this trip to the ER.
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
## 2015-01-25 NOTE — ED Notes (Signed)
Patient aware that a urine sample is needed. Urinal is at the bedside.  

## 2015-01-25 NOTE — ED Notes (Signed)
Patient admits to [facility] with no plan. Patient denies HI and AVH at this time. Patient given chicken broth, crackers, cheese stick and orange juice. Patient oriented to unit. Patient given extra blankets. Patient voices no complaints or concerns at this time. Encouragement and support provided and safety maintain. Q 15 min safety checks remain in place.

## 2015-01-25 NOTE — BH Assessment (Signed)
Assessment completed. Consulted Windle Guard, NP who recommended inpatient treatment. Delos Haring, PA-C has been informed of the recommendation. Pt is agreeable to the recommendation.

## 2015-01-26 ENCOUNTER — Observation Stay (HOSPITAL_COMMUNITY)
Admission: AD | Admit: 2015-01-26 | Discharge: 2015-01-28 | Disposition: A | Discharge status: Home / Self Care | Payer: Federal, State, Local not specified - Other | Source: Intra-hospital | Attending: Psychiatry | Admitting: Psychiatry

## 2015-01-26 ENCOUNTER — Encounter (HOSPITAL_COMMUNITY): Payer: Self-pay | Admitting: Emergency Medicine

## 2015-01-26 DIAGNOSIS — R45851 Suicidal ideations: Secondary | ICD-10-CM | POA: Insufficient documentation

## 2015-01-26 DIAGNOSIS — F322 Major depressive disorder, single episode, severe without psychotic features: Secondary | ICD-10-CM

## 2015-01-26 DIAGNOSIS — Z590 Homelessness: Secondary | ICD-10-CM | POA: Insufficient documentation

## 2015-01-26 HISTORY — DX: Major depressive disorder, single episode, severe without psychotic features: F32.2

## 2015-01-26 MED ORDER — TRAZODONE HCL 50 MG PO TABS: 50.0000 mg | ORAL_TABLET | Freq: Every day | ORAL | Status: DC

## 2015-01-26 MED ORDER — IBUPROFEN 600 MG PO TABS
600.0000 mg | ORAL_TABLET | Freq: Three times a day (TID) | ORAL | Status: DC | PRN
Start: 2015-01-26 — End: 2015-01-28

## 2015-01-26 MED ORDER — ALUM & MAG HYDROXIDE-SIMETH 200-200-20 MG/5ML PO SUSP: 30.0000 mL | ORAL | Status: DC | PRN

## 2015-01-26 MED ORDER — CLONIDINE HCL 0.1 MG PO TABS
0.1000 mg | ORAL_TABLET | ORAL | Status: AC
  Administered 2015-01-26: 0.1 mg via ORAL
  Filled 2015-01-26: qty 1

## 2015-01-26 MED ORDER — LORAZEPAM 1 MG PO TABS: 1.0000 mg | ORAL_TABLET | Freq: Three times a day (TID) | ORAL | Status: DC | PRN

## 2015-01-26 MED ORDER — ONDANSETRON HCL 4 MG PO TABS: 4.0000 mg | ORAL_TABLET | Freq: Three times a day (TID) | ORAL | Status: DC | PRN

## 2015-01-26 MED ORDER — ACETAMINOPHEN 325 MG PO TABS: 650.0000 mg | ORAL_TABLET | Freq: Four times a day (QID) | ORAL | Status: DC | PRN

## 2015-01-26 MED ORDER — TRAZODONE HCL 50 MG PO TABS
50.0000 mg | ORAL_TABLET | Freq: Every day | ORAL | Status: DC
Start: 2015-01-26 — End: 2015-01-28
  Administered 2015-01-26 – 2015-01-27 (×2): 50 mg via ORAL
  Filled 2015-01-26 (×2): qty 1

## 2015-01-26 MED ORDER — FLUOXETINE HCL 10 MG PO CAPS
10.0000 mg | ORAL_CAPSULE | Freq: Every day | ORAL | Status: DC
  Filled 2015-01-26: qty 1

## 2015-01-26 MED ORDER — NICOTINE 21 MG/24HR TD PT24
21.0000 mg | MEDICATED_PATCH | Freq: Every day | TRANSDERMAL | Status: DC
  Filled 2015-01-26: qty 1

## 2015-01-26 MED ORDER — MAGNESIUM HYDROXIDE 400 MG/5ML PO SUSP: 30.0000 mL | Freq: Every day | ORAL | Status: DC | PRN

## 2015-01-26 MED ORDER — FLUOXETINE HCL 10 MG PO CAPS
10.0000 mg | ORAL_CAPSULE | Freq: Every day | ORAL | Status: DC
  Administered 2015-01-26 – 2015-01-28 (×3): 10 mg via ORAL
  Filled 2015-01-26 (×3): qty 1

## 2015-01-26 MED ORDER — ACETAMINOPHEN 325 MG PO TABS: 650.0000 mg | ORAL_TABLET | ORAL | Status: DC | PRN

## 2015-01-26 NOTE — Progress Notes (Signed)
[facility] Crisis Plan  Reason for Crisis Plan:  Crisis Stabilization   Plan of Care:  Referral for IOP  Family Support:      Current Living Environment:  Living Arrangements: Other (Comment) (homeless)  Insurance:   Hospital Account    Name Acct ID Class Status Primary Coverage   [place], [place] [id number] [place] [facility]/DD/SAS - [place] [address]        Guarantor Account (for Hospital Account [id number])    Name Relation to [place]? Acct Type   Park Breed Self [facility] Yes Behavioral Health   Address Phone       [address] [place], [place] [address] [phone])          Coverage Information (for Hospital Account [id number])    F/O Payor/Plan Precert #   [place] [facility]/DD/SAS/[place] #   [place], Sunday [id number]   Address Phone   [address] [place], [place] [address] [phone]      Legal Guardian:     Primary Care Provider:  No primary care provider on file.  Current Outpatient Providers:  None  Psychiatrist:     Counselor/Therapist:     Compliant with Medications:  None Additional Information:   Jolene Provost 10/1/20162:57 PM

## 2015-01-26 NOTE — Progress Notes (Signed)
[facility] INPATIENT:  Family/Significant Other Suicide Prevention Education  Suicide Prevention Education:  Patient Refusal for Family/Significant Other Suicide Prevention Education: The patient Chase Parsons has refused to provide written consent for family/significant other to be provided Family/Significant Other Suicide Prevention Education during admission and/or prior to discharge.    Jolene Provost 01/26/2015, 3:04 PM

## 2015-01-26 NOTE — BH Assessment (Signed)
Spoke with Helene Kelp from [facility] requesting updated vitals. She also inquired about pt's potassium level. Pt will be placed on the wait list once he is medically cleared.

## 2015-01-26 NOTE — Consult Note (Signed)
[facility] Consult   Reason for Consult:  MDD, Single Episode, Anger, Agitation Referring Physician:  EDP Patient Identification: Chase Parsons MRN:  [id number] Principal Diagnosis: MDD (major depressive disorder), single episode, severe , no psychosis ([place]) Diagnosis:   Patient Active Problem List   Diagnosis Date Noted  . MDD (major depressive disorder), single episode, severe , no psychosis ([place]) [F32.2] 01/26/2015    Priority: High    Total Time spent with patient: 1 hour  Subjective:   Chase Parsons is a 59 y.o. male patient admitted with MDD, single  Episode, anger, agitation.  HPI:  [name], 59 years old was evaluated for depressed mood, agitation and anger.  He reports that he has been felling suicidal with no plans for being homeless.  Patient stated that he is not able to stay at a homeless shelter because he has no ID.   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  He reports not want   Before becoming homeless, he was living with his mother who would not let him come back because patient refused to hand her over all of his food stamp.  Patient reports feeling hopeless and helpless.   He admitted to previous suicide attempt by OD at age 29.  He reports past hx of substance abuse and states that he has been clean for 6 months.  He reports feeling suicidal today with no plans.  He does not see a Psychiatrist at this time and is not taking any medications.  He reports poor sleep and appetite.  He has been accepted for admission at our Observation unit.  Past Psychiatric History:  Unknown, does not remember  Risk to Self: Suicidal Ideation: No-Not Currently/Within Last 6 Months Suicidal Intent: No Is patient at risk for suicide?: No Suicidal Plan?: No Access to Means: No What has been your use of drugs/alcohol within the last 12 months?: No alcohol or drug use reported.  How many times?:  1 Other Self Harm Risks: No self harm risk reported.  Triggers for Past Attempts: Unknown Intentional Self Injurious Behavior: None Risk to Others: Homicidal Ideation: No Thoughts of Harm to Others: No Current Homicidal Intent: No Current Homicidal Plan: No Access to Homicidal Means: No Identified Victim: N/A History of harm to others?: No Assessment of Violence: On admission Violent Behavior Description: No violent behaviors reported.  Does patient have access to weapons?: No Criminal Charges Pending?: No Does patient have a court date: No Prior Inpatient Therapy: Prior Inpatient Therapy: No Prior Outpatient Therapy: Prior Outpatient Therapy: No Does patient have an ACCT team?: No Does patient have Intensive In-House Services?  : No Does patient have [facility] services? : No Does patient have P4CC services?: No  Past Medical History:  Past Medical History  Diagnosis Date  . Pneumonia    History reviewed. No pertinent past surgical history. Family History: History reviewed. No pertinent family history.   Family Psychiatric  History:  Patient does not remember, does not believe any of his siblings suffers from mental illness. Social History:  History  Alcohol Use No     History  Drug Use No    Social History   Social History  . Marital Status: Single    Spouse Name: N/A  . Number of Children: N/A  . Years of Education: N/A   Social History Main Topics  . Smoking status: Never Smoker   . Smokeless tobacco: None  .  Alcohol Use: No  . Drug Use: No  . Sexual Activity: Not Asked   Other Topics Concern  . None   Social History Narrative   Additional Social History:    History of alcohol / drug use?: Yes Name of Substance 1: Alcohol  1 - Age of First Use: 11 1 - Amount (size/oz): unknown  1 - Frequency: unknown  1 - Duration: ongoing  1 - Last Use / Amount: "6 months ago"/   Allergies:  No Known Allergies  Labs:  Results for orders placed or performed  during the hospital encounter of 01/25/15 (from the past 48 hour(s))  Comprehensive metabolic panel     Status: Abnormal   Collection Time: 01/25/15  4:11 PM  Result Value Ref Range   Sodium 136 135 - 145 mmol/L   Potassium 3.2 (L) 3.5 - 5.1 mmol/L   Chloride 102 101 - 111 mmol/L   CO2 26 22 - 32 mmol/L   Glucose, Bld 154 (H) 65 - 99 mg/dL   BUN 6 6 - 20 mg/dL   Creatinine, Ser 0.58 (L) 0.61 - 1.24 mg/dL   Calcium 8.8 (L) 8.9 - 10.3 mg/dL   Total Protein 7.6 6.5 - 8.1 g/dL   Albumin 3.5 3.5 - 5.0 g/dL   AST 86 (H) 15 - 41 U/L   ALT 60 17 - 63 U/L   Alkaline Phosphatase 193 (H) 38 - 126 U/L   Total Bilirubin 1.9 (H) 0.3 - 1.2 mg/dL   GFR calc non Af Amer >60 >60 mL/min   GFR calc Af Amer >60 >60 mL/min    Comment: (NOTE) The eGFR has been calculated using the CKD EPI equation. This calculation has not been validated in all clinical situations. eGFR's persistently <60 mL/min signify possible Chronic Kidney Disease.    Anion gap 8 5 - 15  Ethanol (ETOH)     Status: None   Collection Time: 01/25/15  4:11 PM  Result Value Ref Range   Alcohol, Ethyl (B) <5 <5 mg/dL    Comment:        LOWEST DETECTABLE LIMIT FOR SERUM ALCOHOL IS 5 mg/dL FOR MEDICAL PURPOSES ONLY   Salicylate level     Status: None   Collection Time: 01/25/15  4:11 PM  Result Value Ref Range   Salicylate Lvl <8.3 2.8 - 30.0 mg/dL  Acetaminophen level     Status: Abnormal   Collection Time: 01/25/15  4:11 PM  Result Value Ref Range   Acetaminophen (Tylenol), Serum <10 (L) 10 - 30 ug/mL    Comment:        THERAPEUTIC CONCENTRATIONS VARY SIGNIFICANTLY. A RANGE OF 10-30 ug/mL MAY BE AN EFFECTIVE CONCENTRATION FOR MANY PATIENTS. HOWEVER, SOME ARE BEST TREATED AT CONCENTRATIONS OUTSIDE THIS RANGE. ACETAMINOPHEN CONCENTRATIONS >150 ug/mL AT 4 HOURS AFTER INGESTION AND >50 ug/mL AT 12 HOURS AFTER INGESTION ARE OFTEN ASSOCIATED WITH TOXIC REACTIONS.   CBC     Status: Abnormal   Collection Time: 01/25/15   4:11 PM  Result Value Ref Range   WBC 6.3 4.0 - 10.5 K/uL   RBC 3.23 (L) 4.22 - 5.81 MIL/uL   Hemoglobin 10.8 (L) 13.0 - 17.0 g/dL   HCT 32.4 (L) 39.0 - 52.0 %   MCV 100.3 (H) 78.0 - 100.0 fL   MCH 33.4 26.0 - 34.0 pg   MCHC 33.3 30.0 - 36.0 g/dL   RDW 12.4 11.5 - 15.5 %   Platelets 96 (L) 150 - 400 K/uL  Comment: RESULT REPEATED AND VERIFIED SPECIMEN CHECKED FOR CLOTS PLATELET COUNT CONFIRMED BY SMEAR   Lipase, blood     Status: None   Collection Time: 01/25/15  4:11 PM  Result Value Ref Range   Lipase 41 22 - 51 U/L  Urine rapid drug screen (hosp performed) (Not at [facility])     Status: Abnormal   Collection Time: 01/25/15  6:30 PM  Result Value Ref Range   Opiates NONE DETECTED NONE DETECTED   Cocaine NONE DETECTED NONE DETECTED   Benzodiazepines POSITIVE (A) NONE DETECTED   Amphetamines NONE DETECTED NONE DETECTED   Tetrahydrocannabinol NONE DETECTED NONE DETECTED   Barbiturates NONE DETECTED NONE DETECTED    Comment:        DRUG SCREEN FOR MEDICAL PURPOSES ONLY.  IF CONFIRMATION IS NEEDED FOR ANY PURPOSE, NOTIFY LAB WITHIN 5 DAYS.        LOWEST DETECTABLE LIMITS FOR URINE DRUG SCREEN Drug Class       Cutoff (ng/mL) Amphetamine      1000 Barbiturate      200 Benzodiazepine   409 Tricyclics       811 Opiates          300 Cocaine          300 THC              50     Current Facility-Administered Medications  Medication Dose Route Frequency Provider Last Rate Last Dose  . acetaminophen (TYLENOL) tablet 650 mg  650 mg Oral Q4H PRN Delos Haring, PA-C      . alum & mag hydroxide-simeth (MAALOX/MYLANTA) 200-200-20 MG/5ML suspension 30 mL  30 mL Oral PRN Delos Haring, PA-C      . ibuprofen (ADVIL,MOTRIN) tablet 600 mg  600 mg Oral Q8H PRN Delos Haring, PA-C      . LORazepam (ATIVAN) tablet 1 mg  1 mg Oral Q8H PRN Delos Haring, PA-C      . nicotine (NICODERM CQ - dosed in mg/24 hours) patch 21 mg  21 mg Transdermal Daily Delos Haring, PA-C   21 mg at 01/25/15  2147  . ondansetron (ZOFRAN) tablet 4 mg  4 mg Oral Q8H PRN Delos Haring, PA-C      . traZODone (DESYREL) tablet 50 mg  50 mg Oral QHS Delfin Gant, NP       Current Outpatient Prescriptions  Medication Sig Dispense Refill  . HYDROcodone-acetaminophen (NORCO) 5-325 MG per tablet Take 1-2 tablets by mouth every 4 (four) hours as needed. (Patient not taking: Reported on 08/04/2014) 20 tablet 0    Musculoskeletal: Strength & Muscle Tone: within normal limits Gait & Station: normal Patient leans: N/A  Psychiatric Specialty Exam: Review of Systems  Constitutional: Negative.   HENT: Negative.   Eyes: Negative.   Respiratory: Negative.   Cardiovascular: Negative.   Gastrointestinal: Negative.   Genitourinary: Negative.   Musculoskeletal: Negative.   Skin: Negative.   Neurological: Negative.   Endo/Heme/Allergies: Negative.     Blood pressure 122/81, pulse 95, temperature 97.9 F (36.6 C), temperature source Oral, resp. rate 16, height 5' 10" (1.778 m), weight 61.236 kg (135 lb), SpO2 100 %.Body mass index is 19.37 kg/(m^2).  General Appearance: Casual and Disheveled  Eye Contact::  Minimal  Speech:  Clear and Coherent and Slow  Volume:  Normal  Mood:  Depressed  Affect:  Congruent, Depressed and Flat  Thought Process:  Coherent, Goal Directed and Intact  Orientation:  Full (Time, Place, and Person)  Thought  Content:  WDL  Suicidal Thoughts:  No  Homicidal Thoughts:  No  Memory:  Immediate;   Good Recent;   Good Remote;   Fair  Judgement:  Fair  Insight:  Fair  Psychomotor Activity:  Psychomotor Retardation  Concentration:  Good  Recall:  NA  Fund of Knowledge:Fair  Language: Good  Akathisia:  NA  Handed:  Right  AIMS (if indicated):     Assets:  Desire for Improvement Housing Transportation  ADL's:  Impaired  Cognition: WNL  Sleep:      Treatment Plan Summary: Daily contact with patient to assess and evaluate symptoms and progress in treatment and  Medication management  Disposition: Accepted at Observation unit, Will start Trazodone 50 mg po at bed time for sleep, Prozac 10 mg po daily for depression and offer the rest of our PRN medications as needed.  Delfin Gant  PMHNP-BC 01/26/2015 12:33 PM

## 2015-01-26 NOTE — Progress Notes (Signed)
Patient resting in bed with eyes closed. Pt denies any needs at this time when approached by this RN. No s/s of distress noted currently.

## 2015-01-26 NOTE — Progress Notes (Signed)
Voluntary paperwork for OBS unit signed by patient. Writer faxed over to TTS.   Patient accepted to OBS Bed 5.    Boyce Medici. MSW, LCSW Therapeutic Triage Services-Triage Specialist   Phone: [phone]

## 2015-01-26 NOTE — Progress Notes (Signed)
BP continues to be elevated see VS flowsheet. Johnn Hai NP notified, orders received. Will continue to monitor closely.

## 2015-01-26 NOTE — Progress Notes (Signed)
D: Patient is a 59 year old male who was having suicidal thoughts of hurting himself. Patient states he went to [facility] and spoke to someone there and was then brought to [facility]. Patient states he attempted suicide years ago. He currently has passive SI denies HI. Patient is homeless and has no contact with family states he is " isolated and alone most of the time." Patient states he hears voices but they are not commanding. He denies auditory hallucinations at this time.  A: Patient has been admitted to the observation unit and is being monitored by staff. Q 15 minute checks initiated and maintained. R: Patient is calm and cooperative and watching television at this time. He was given lunch and oriented to unit. Patient took medications as ordered and monitoring continues.

## 2015-01-26 NOTE — BH Assessment (Signed)
[place] Assessment Progress Note   Clinician talked to patient about current needs.  He said that he has to get a new social security card.  He does have services through [facility] and a case worker.  Patient says that he has to get the card replaced in person, had tried to do it on-line.  Pt says however that his case worker at [facility] said that he can go to [employer] to stay, she had contacted them and let them know about his ID problem.  Patient denies SI, HI or A/V hallucinations.  Patient talked about how unfairly his mother treats him, not letting him stay at her home unless he turns over his food stamp card.  Clinician informed patient that he would be seen by and extender in the morning.

## 2015-01-27 DIAGNOSIS — R45851 Suicidal ideations: Secondary | ICD-10-CM | POA: Diagnosis not present

## 2015-01-27 DIAGNOSIS — F322 Major depressive disorder, single episode, severe without psychotic features: Principal | ICD-10-CM

## 2015-01-27 NOTE — H&P (Signed)
[facility] OBS UNIT H&P   Patient Identification: Pistol Kessenich MRN: [id number] Principal Diagnosis: Severe single current episode of major depressive disorder, without psychotic features [facility]) Diagnosis:  Patient Active Problem List   Diagnosis Date Noted  . Severe single current episode of major depressive disorder, without psychotic features ([place]) [F32.2] 01/26/2015    Priority: High  . MDD (major depressive disorder), single episode, severe , no psychosis ([place]) [F32.2] 01/26/2015  . Suicidal thoughts [R45.851]    Total Time spent with patient: 45 minutes  Subjective:  Savaughn Karwowski is a 59 y.o. male patient admitted with MDD, single Episode, anger, agitation. Patient states "I am still depressed. Not sure if I would do anything to myself. I am homeless. Am supposed to go to [facility] tomorrow."   HPI: I have reviewed and concur with HPI below, modified as follows: [name], 59 years old was evaluated for depressed mood, agitation and anger. He reports that he has been felling suicidal with no plans for being homeless. Patient stated that he is not able to stay at a homeless shelter because he has no ID. 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 Before becoming homeless, he was living with his mother who would not let him come back because patient refused to hand her over all of his food stamp. Patient reports feeling hopeless and helpless. He admitted to previous suicide attempt by OD at age 4. He reports past hx of substance abuse and states that he has been clean for 6 months. He reports feeling suicidal today with no plans. He does not see a Psychiatrist at this time and is not taking any medications. He reports poor sleep and appetite. He has been accepted for admission at our Observation unit.  *Pt spent the night in the [facility] OBS Unit without incident. Pt seen this AM for evaluation, as  above. Patient recently started on Prozac for depression and continues to expressive passive suicidal remarks.   Past Psychiatric History: Unknown, does not remember  Risk to Self: Is patient at risk for suicide?: Yes Risk to Others:   Prior Inpatient Therapy:   Prior Outpatient Therapy:    Past Medical History:  Past Medical History  Diagnosis Date  . Pneumonia    History reviewed. No pertinent past surgical history. Family History: History reviewed. No pertinent family history.   Family Psychiatric History: Patient does not remember, does not believe any of his siblings suffers from mental illness. Social History:  History  Alcohol Use No    History  Drug Use No    Social History   Social History  . Marital Status: Single    Spouse Name: N/A  . Number of Children: N/A  . Years of Education: N/A   Social History Main Topics  . Smoking status: Never Smoker   . Smokeless tobacco: None  . Alcohol Use: No  . Drug Use: No  . Sexual Activity: Not Asked   Other Topics Concern  . None   Social History Narrative   Additional Social History:   Pain Medications: Pt. denies Prescriptions: pt. denies Over the Counter: pt. denies History of alcohol / drug use?: Yes Name of Substance 1: Alcohol  1 - Age of First Use: 11 1 - Amount (size/oz): unknown  1 - Frequency: unknown  1 - Duration: ongoing  1 - Last Use / Amount: "6 months ago"/   Allergies: No Known Allergies  Labs:  Lab Results Last 48 Hours    Results for orders placed or performed during the hospital encounter of 01/25/15 (from the past 48 hour(s))  Comprehensive metabolic panel Status: Abnormal   Collection Time: 01/25/15 4:11 PM  Result Value Ref Range   Sodium 136 135 - 145 mmol/L   Potassium 3.2 (L) 3.5 - 5.1 mmol/L   Chloride 102 101 - 111 mmol/L   CO2 26 22 - 32 mmol/L   Glucose, Bld 154 (H)  65 - 99 mg/dL   BUN 6 6 - 20 mg/dL   Creatinine, Ser 0.58 (L) 0.61 - 1.24 mg/dL   Calcium 8.8 (L) 8.9 - 10.3 mg/dL   Total Protein 7.6 6.5 - 8.1 g/dL   Albumin 3.5 3.5 - 5.0 g/dL   AST 86 (H) 15 - 41 U/L   ALT 60 17 - 63 U/L   Alkaline Phosphatase 193 (H) 38 - 126 U/L   Total Bilirubin 1.9 (H) 0.3 - 1.2 mg/dL   GFR calc non Af Amer >60 >60 mL/min   GFR calc Af Amer >60 >60 mL/min    Comment: (NOTE) The eGFR has been calculated using the CKD EPI equation. This calculation has not been validated in all clinical situations. eGFR's persistently <60 mL/min signify possible Chronic Kidney Disease.    Anion gap 8 5 - 15  Ethanol (ETOH) Status: None   Collection Time: 01/25/15 4:11 PM  Result Value Ref Range   Alcohol, Ethyl (B) <5 <5 mg/dL    Comment:   LOWEST DETECTABLE LIMIT FOR SERUM ALCOHOL IS 5 mg/dL FOR MEDICAL PURPOSES ONLY   Salicylate level Status: None   Collection Time: 01/25/15 4:11 PM  Result Value Ref Range   Salicylate Lvl <5.2 2.8 - 30.0 mg/dL  Acetaminophen level Status: Abnormal   Collection Time: 01/25/15 4:11 PM  Result Value Ref Range   Acetaminophen (Tylenol), Serum <10 (L) 10 - 30 ug/mL    Comment:   THERAPEUTIC CONCENTRATIONS VARY SIGNIFICANTLY. A RANGE OF 10-30 ug/mL MAY BE AN EFFECTIVE CONCENTRATION FOR MANY PATIENTS. HOWEVER, SOME ARE BEST TREATED AT CONCENTRATIONS OUTSIDE THIS RANGE. ACETAMINOPHEN CONCENTRATIONS >150 ug/mL AT 4 HOURS AFTER INGESTION AND >50 ug/mL AT 12 HOURS AFTER INGESTION ARE OFTEN ASSOCIATED WITH TOXIC REACTIONS.   CBC Status: Abnormal   Collection Time: 01/25/15 4:11 PM  Result Value Ref Range   WBC 6.3 4.0 - 10.5 K/uL   RBC 3.23 (L) 4.22 - 5.81 MIL/uL   Hemoglobin 10.8 (L) 13.0 - 17.0 g/dL   HCT 32.4 (L) 39.0 - 52.0 %   MCV 100.3 (H) 78.0 - 100.0 fL   MCH 33.4 26.0 -  34.0 pg   MCHC 33.3 30.0 - 36.0 g/dL   RDW 12.4 11.5 - 15.5 %   Platelets 96 (L) 150 - 400 K/uL    Comment: RESULT REPEATED AND VERIFIED SPECIMEN CHECKED FOR CLOTS PLATELET COUNT CONFIRMED BY SMEAR   Lipase, blood Status: None   Collection Time: 01/25/15 4:11 PM  Result Value Ref Range   Lipase 41 22 - 51 U/L  Urine rapid drug screen (hosp performed) (Not at [facility]) Status: Abnormal   Collection Time: 01/25/15 6:30 PM  Result Value Ref Range   Opiates NONE DETECTED NONE DETECTED   Cocaine NONE DETECTED NONE DETECTED   Benzodiazepines POSITIVE (A) NONE DETECTED   Amphetamines NONE DETECTED NONE DETECTED   Tetrahydrocannabinol NONE DETECTED NONE DETECTED   Barbiturates NONE DETECTED NONE DETECTED    Comment:   DRUG SCREEN FOR MEDICAL PURPOSES  ONLY. IF CONFIRMATION IS NEEDED FOR ANY PURPOSE, NOTIFY LAB WITHIN 5 DAYS.   LOWEST DETECTABLE LIMITS FOR URINE DRUG SCREEN Drug Class Cutoff (ng/mL) Amphetamine 1000 Barbiturate 200 Benzodiazepine 158 Tricyclics 309 Opiates 407 Cocaine 300 THC 50       Current Facility-Administered Medications  Medication Dose Route Frequency Provider Last Rate Last Dose  . acetaminophen (TYLENOL) tablet 650 mg 650 mg Oral Q4H PRN Delfin Gant, NP    . acetaminophen (TYLENOL) tablet 650 mg 650 mg Oral Q6H PRN Delfin Gant, NP    . alum & mag hydroxide-simeth (MAALOX/MYLANTA) 200-200-20 MG/5ML suspension 30 mL 30 mL Oral PRN Delfin Gant, NP    . alum & mag hydroxide-simeth (MAALOX/MYLANTA) 200-200-20 MG/5ML suspension 30 mL 30 mL Oral Q4H PRN Delfin Gant, NP    . FLUoxetine (PROZAC) capsule 10 mg 10 mg Oral Daily Delfin Gant, NP  10 mg at 01/27/15 0731  . ibuprofen (ADVIL,MOTRIN) tablet 600 mg 600 mg Oral Q8H PRN  Delfin Gant, NP    . LORazepam (ATIVAN) tablet 1 mg 1 mg Oral Q8H PRN Delfin Gant, NP    . magnesium hydroxide (MILK OF MAGNESIA) suspension 30 mL 30 mL Oral Daily PRN Delfin Gant, NP    . nicotine (NICODERM CQ - dosed in mg/24 hours) patch 21 mg 21 mg Transdermal Daily Delfin Gant, NP  21 mg at 01/27/15 0731  . ondansetron (ZOFRAN) tablet 4 mg 4 mg Oral Q8H PRN Delfin Gant, NP    . traZODone (DESYREL) tablet 50 mg 50 mg Oral QHS Delfin Gant, NP  50 mg at 01/26/15 2121    Musculoskeletal: Strength & Muscle Tone: within normal limits Gait & Station: normal Patient leans: N/A  Psychiatric Specialty Exam: Review of Systems  Constitutional: Negative.  HENT: Negative.  Eyes: Negative.  Respiratory: Negative.  Cardiovascular: Negative.  Gastrointestinal: Negative.  Genitourinary: Negative.  Musculoskeletal: Negative.  Skin: Negative.  Neurological: Negative.  Endo/Heme/Allergies: Negative.  Psychiatric/Behavioral: Positive for depression and suicidal ideas. The patient is nervous/anxious.    Blood pressure 127/79, pulse 98, temperature 98.6 F (37 C), temperature source Oral, resp. rate 18, height 5' 10" (1.778 m), weight 61.236 kg (135 lb), SpO2 99 %.Body mass index is 19.37 kg/(m^2).  General Appearance: Casual and Disheveled  Eye Contact:: Minimal  Speech: Clear and Coherent and Slow  Volume: Normal  Mood: Depressed  Affect: Congruent, Depressed and Flat  Thought Process: Coherent, Goal Directed and Intact  Orientation: Full (Time, Place, and Person)  Thought Content: Rumination  Suicidal Thoughts: Yes. without intent/plan  Homicidal Thoughts: No  Memory: Immediate; Good Recent; Good Remote; Fair  Judgement: Fair  Insight: Fair  Psychomotor Activity: Psychomotor Retardation  Concentration: Good  Recall: Good  Fund of  Knowledge:Fair  Language: Good  Akathisia: NA  Handed: Right  AIMS (if indicated):    Assets: Desire for Improvement Housing Transportation  ADL's: Impaired  Cognition: WNL  Sleep:     Treatment Plan Summary: Daily contact with patient to assess and evaluate symptoms and progress in treatment and Medication management  Disposition:  -Continue Prozac 10 mg daily for depression -Will be observed overnight to determine disposition in the am.   Elmarie Shiley, NP-C 01/27/2015 3:18 PM             Case discussed with me as above Neita Garnet , MD

## 2015-01-27 NOTE — BH Assessment (Signed)
Chase Parsons Assessment Progress Note Pt will be observed for another 24 hours to determine disposition in the a.m. Patient stated he is currently homeless but will have housing as of 01/28/15.

## 2015-01-27 NOTE — BH Assessment (Signed)
Spoke with patient. Patient confirmed that upon d/c in a.m. That he would be going to [facility] form here for services as in epic note.  Danaija Eskridge K. [place], LPC-A, LCAS-A  Counselor 01/27/2015 7:40 PM

## 2015-01-27 NOTE — Progress Notes (Signed)
Pt alert and cooperative. "I feel alright, I feel this came from not sleeping". Denies SI/HI/A/V/H. "I plan to try and get in the shelter".  Emotional support and encouragement given. Will continue to monitor closely.

## 2015-01-27 NOTE — Progress Notes (Signed)
Patient resting at the beginning of the shift. Denies pain, SI/AH/VH at this time. Patient stated "I'm all right, think I will need a sleeping pill tonight". Emotional support offered. Every 15 minutes check for safety maintained. Will continue to monitor patient.

## 2015-01-28 DIAGNOSIS — F322 Major depressive disorder, single episode, severe without psychotic features: Secondary | ICD-10-CM | POA: Diagnosis not present

## 2015-01-28 MED ORDER — TRAZODONE HCL 50 MG PO TABS: 50.0000 mg | ORAL_TABLET | Freq: Every day | ORAL | Status: DC

## 2015-01-28 MED ORDER — NICOTINE 21 MG/24HR TD PT24: 21.0000 mg | MEDICATED_PATCH | Freq: Every day | TRANSDERMAL | Status: DC

## 2015-01-28 MED ORDER — FLUOXETINE HCL 10 MG PO CAPS: 10.0000 mg | ORAL_CAPSULE | Freq: Every day | ORAL | Status: DC

## 2015-01-28 NOTE — BH Assessment (Signed)
Spoke with Roderic Palau from [facility] requesting updated vitals and recent potassium level. This Probation officer provided information regarding pt most recent vitals and potassium level.

## 2015-01-28 NOTE — Progress Notes (Signed)
Pt d/c from the observation unit. All items returned. D/C instructions given and prescriptions given. Pt denies si and hi.

## 2015-01-28 NOTE — Discharge Summary (Signed)
Physician Discharge Summary Note  Patient:  Chase Parsons is an 59 y.o., male MRN:  [id number] DOB:  1956-02-21 Patient phone:  [phone] (home)  Patient address:   [place] [address],  Total Time spent with patient: 30 minutes  Date of Admission:  01/26/2015 Date of Discharge: 01/28/2015  Reason for Admission: Depression  Principal Problem: Severe single current episode of major depressive disorder, without psychotic features Chase Parsons) Discharge Diagnoses: Patient Active Problem List   Diagnosis Date Noted  . Severe single current episode of major depressive disorder, without psychotic features (Chase Parsons) [F32.2] 01/26/2015    Priority: High  . MDD (major depressive disorder), single episode, severe , no psychosis (Chase Parsons) [F32.2] 01/26/2015  . Suicidal thoughts [R45.851]     Musculoskeletal: Strength & Muscle Tone: within normal limits Gait & Station: normal Patient leans: N/A  Psychiatric Specialty Exam: Physical Exam  Review of Systems  Constitutional: Negative.   HENT: Negative.   Eyes: Negative.   Respiratory: Negative.   Cardiovascular: Negative.   Gastrointestinal: Negative.   Genitourinary: Negative.   Musculoskeletal: Negative.   Skin: Negative.   Neurological: Negative.   Psychiatric/Behavioral: Positive for depression. Negative for suicidal ideas (Stable ), hallucinations and memory loss. The patient is not nervous/anxious and does not have insomnia.     Blood pressure 115/75, pulse 86, temperature 98.1 F (36.7 C), temperature source Oral, resp. rate 18, height 5\' 10"  (1.778 m), weight 61.236 kg (135 lb), SpO2 99 %.Body mass index is 19.37 kg/(m^2).  General Appearance: Casual  Eye Contact::  Good  Speech:  Clear and Coherent  Volume:  Normal  Mood:  Euthymic  Affect:  Appropriate  Thought Process:  Coherent and Goal Directed  Orientation:  Full (Time, Place, and Person)  Thought Content:  WDL  Suicidal Thoughts:  No  Homicidal  Thoughts:  No  Memory:  Immediate;   Good Recent;   Good Remote;   Good  Judgement:  Fair  Insight:  Present  Psychomotor Activity:  Normal  Concentration:  Good  Recall:  Good  Fund of Knowledge:Good  Language: Good  Akathisia:  No  Handed:  Right  AIMS (if indicated):     Assets:  Communication Skills Desire for Improvement Leisure Time Physical Health Resilience  ADL's:  Intact  Cognition: WNL  Sleep:      Have you used any form of tobacco in the last 30 days? (Cigarettes, Smokeless Tobacco, Cigars, and/or Pipes): No  Has this patient used any form of tobacco in the last 30 days? (Cigarettes, Smokeless Tobacco, Cigars, and/or Pipes) Yes, patient was provided with a nicotine patch at time of discharge.   Past Medical History:  Past Medical History  Diagnosis Date  . Pneumonia    History reviewed. No pertinent past surgical history. Family History: History reviewed. No pertinent family history. Social History:  History  Alcohol Use No     History  Drug Use No    Social History   Social History  . Marital Status: Single    Spouse Name: N/A  . Number of Children: N/A  . Years of Education: N/A   Social History Main Topics  . Smoking status: Never Smoker   . Smokeless tobacco: None  . Alcohol Use: No  . Drug Use: No  . Sexual Activity: Not Asked   Other Topics Concern  . None   Social History Narrative   Risk to Self: Is patient at risk for suicide?: Yes Risk to Others:   Prior  Inpatient Therapy:   Prior Outpatient Therapy:    Level of Care:  OP  Hospital Course:    Chase Parsons is a [name], 59 years old was evaluated for depressed mood, agitation and anger. He reports that he has been felling suicidal with no plans for being homeless. Patient stated that he is not able to stay at a homeless shelter because he has no ID. 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 Before  becoming homeless, he was living with his mother who would not let him come back because patient refused to hand her over all of his food stamp. Patient reports feeling hopeless and helpless. He admitted to previous suicide attempt by OD at age 62. He reports past hx of substance abuse and states that he has been clean for 6 months. He reports feeling suicidal today with no plans. He does not see a Psychiatrist at this time and is not taking any medications. He reports poor sleep and appetite. Patient was accepted for admission at our Observation unit. Patient was started on Prozac 10 mg daily for depression. The patient was scheduled for [facility] the next day to work on his housing issue. He denied any suicidal ideation. He tolerated the Prozac with no reported adverse effects. Patient appeared to function well in the observation unit and was observed watching foot ball yesterday evening. He left [facility] in no acute distress with all belongings presented. Patient was provided a prescription for Prozac at time of discharge.   Consults:  psychiatry  Significant Diagnostic Studies:  Chemistry panel, CBC,   Discharge Vitals:   Blood pressure 115/75, pulse 86, temperature 98.1 F (36.7 C), temperature source Oral, resp. rate 18, height 5\' 10"  (1.778 m), weight 61.236 kg (135 lb), SpO2 99 %. Body mass index is 19.37 kg/(m^2). Lab Results:   Results for orders placed or performed during the hospital encounter of 01/25/15 (from the past 72 hour(s))  Urine rapid drug screen (hosp performed) (Not at [facility])     Status: Abnormal   Collection Time: 01/25/15  6:30 PM  Result Value Ref Range   Opiates NONE DETECTED NONE DETECTED   Cocaine NONE DETECTED NONE DETECTED   Benzodiazepines POSITIVE (A) NONE DETECTED   Amphetamines NONE DETECTED NONE DETECTED   Tetrahydrocannabinol NONE DETECTED NONE DETECTED   Barbiturates NONE DETECTED NONE DETECTED    Comment:        DRUG SCREEN FOR MEDICAL PURPOSES ONLY.  IF  CONFIRMATION IS NEEDED FOR ANY PURPOSE, NOTIFY LAB WITHIN 5 DAYS.        LOWEST DETECTABLE LIMITS FOR URINE DRUG SCREEN Drug Class       Cutoff (ng/mL) Amphetamine      1000 Barbiturate      200 Benzodiazepine   829 Tricyclics       937 Opiates          300 Cocaine          300 THC              50     Physical Findings: AIMS: Facial and Oral Movements Muscles of Facial Expression: None, normal Lips and Perioral Area: None, normal Jaw: None, normal Tongue: None, normal,Extremity Movements Upper (arms, wrists, hands, fingers): None, normal Lower (legs, knees, ankles, toes): None, normal, Trunk Movements Neck, shoulders, hips: None, normal, Overall Severity Severity of abnormal movements (highest score from questions above): None, normal Incapacitation due to abnormal  movements: None, normal Patient's awareness of abnormal movements (rate only patient's report): No Awareness,    CIWA:    COWS:      See Psychiatric Specialty Exam and Suicide Risk Assessment completed by Attending Physician prior to discharge.  Discharge destination:  Home  Is patient on multiple antipsychotic therapies at discharge:  No   Has Patient had three or more failed trials of antipsychotic monotherapy by history:  No    Recommended Plan for Multiple Antipsychotic Therapies: NA     Medication List    TAKE these medications      Indication   FLUoxetine 10 MG capsule  Commonly known as:  PROZAC  Take 1 capsule (10 mg total) by mouth daily.   Indication:  Depression, Major Depressive Disorder     nicotine 21 mg/24hr patch  Commonly known as:  NICODERM CQ - dosed in mg/24 hours  Place 1 patch (21 mg total) onto the skin daily.   Indication:  Nicotine Addiction     traZODone 50 MG tablet  Commonly known as:  DESYREL  Take 1 tablet (50 mg total) by mouth at bedtime.   Indication:  Trouble Sleeping         Follow-up recommendations:   Patient discharged with outpatient resources and  provided transportation to the [facility] in [place] to apply for temporary housing.   Comments:  Take all your medications as prescribed by your mental healthcare provider.  Report any adverse effects and or reactions from your medicines to your outpatient provider promptly.  Patient is instructed and cautioned to not engage in alcohol and or illegal drug use while on prescription medicines.  In the event of worsening symptoms, patient is instructed to call the crisis hotline, 911 and or go to the nearest ED for appropriate evaluation and treatment of symptoms.  Follow-up with your primary care provider for your other medical issues, concerns and or health care needs.   Total Discharge Time: Greater than 30 minutes  Signed: Elmarie Shiley, NP-C 01/28/2015, 5:49 PM

## 2015-01-28 NOTE — Progress Notes (Signed)
Pt has a flat affect and ate breakfast this morning. He denies pain. Pt denies si and hi.

## 2015-01-28 NOTE — BH Assessment (Signed)
[facility] Assessment Progress Note Patient was seen this a.m. and stated he was feeling much better and felt he was ready to be discharged. Patient denied any S/I or H/I and after consulting with L. Rosana Hoes NP was discharged with outpatient resources and provided transportation to the [facility] in [place] to apply for temporary housing. Patient was provided with discharge information on suicide prevention and substance abuse. This Probation officer contacted the [employer] to inform them that patient was in route to their location.

## 2015-02-18 DIAGNOSIS — N4889 Other specified disorders of penis: Secondary | ICD-10-CM

## 2015-02-18 DIAGNOSIS — R52 Pain, unspecified: Secondary | ICD-10-CM

## 2015-02-18 DIAGNOSIS — I10 Essential (primary) hypertension: Secondary | ICD-10-CM

## 2015-02-18 DIAGNOSIS — F32A Depression, unspecified: Secondary | ICD-10-CM

## 2015-02-18 DIAGNOSIS — S0990XD Unspecified injury of head, subsequent encounter: Secondary | ICD-10-CM

## 2015-02-18 DIAGNOSIS — F329 Major depressive disorder, single episode, unspecified: Secondary | ICD-10-CM

## 2015-04-01 NOTE — Congregational Nurse Program (Signed)
02/18/15 - Client agreed to mental health assessment.  Client sleeps average 4 hours/night.  Appetite is poor according to client.  Client states, "I'm irritable all the time."  In regards to anger control client states, "I get mad and walk away"  Affect is flat and congruent with mood.  Behavior is cooperative.  Client denies suicidal or homicidal ideation.   Client states, "I have eye problems."  Client's drug of choice is alcohol which he quit 6 months ago.   Dayna Ramus, MSN, RN

## 2015-04-03 DIAGNOSIS — Z5900 Homelessness unspecified: Secondary | ICD-10-CM

## 2015-04-03 DIAGNOSIS — S0990XD Unspecified injury of head, subsequent encounter: Secondary | ICD-10-CM

## 2015-04-03 DIAGNOSIS — F32A Depression, unspecified: Secondary | ICD-10-CM

## 2015-04-03 DIAGNOSIS — F329 Major depressive disorder, single episode, unspecified: Secondary | ICD-10-CM

## 2015-04-03 DIAGNOSIS — I10 Essential (primary) hypertension: Secondary | ICD-10-CM

## 2015-04-03 DIAGNOSIS — N4889 Other specified disorders of penis: Secondary | ICD-10-CM

## 2015-04-03 DIAGNOSIS — R52 Pain, unspecified: Secondary | ICD-10-CM

## 2015-04-03 NOTE — Congregational Nurse Program (Signed)
04/03/2015 Chase Parsons came to [facility] inquiring about more information concerning his disability.  Escorted guest to front desk at the [facility] and spoke with Threasa Beards about who client will be able to talk with regarding disability inquiry.  Client was informed disability lawyer will be at [facility] tomorrow on Dec. 8, 2016.  Client verbalized understanding.  Dayna Ramus, RN

## 2016-01-23 ENCOUNTER — Emergency Department (HOSPITAL_COMMUNITY)
Admission: EM | Admit: 2016-01-23 | Discharge: 2016-01-23 | Disposition: A | Discharge status: Home / Self Care | Payer: Self-pay | Attending: Emergency Medicine | Admitting: Emergency Medicine

## 2016-01-23 ENCOUNTER — Emergency Department (HOSPITAL_COMMUNITY): Payer: Self-pay

## 2016-01-23 ENCOUNTER — Encounter (HOSPITAL_COMMUNITY): Payer: Self-pay | Admitting: Emergency Medicine

## 2016-01-23 DIAGNOSIS — Z5321 Procedure and treatment not carried out due to patient leaving prior to being seen by health care provider: Secondary | ICD-10-CM | POA: Insufficient documentation

## 2016-01-23 DIAGNOSIS — R079 Chest pain, unspecified: Secondary | ICD-10-CM | POA: Insufficient documentation

## 2016-01-23 LAB — COMPREHENSIVE METABOLIC PANEL
ALT: 36 U/L (ref 17–63)
ANION GAP: 7 (ref 5–15)
AST: 37 U/L (ref 15–41)
Albumin: 4.4 g/dL (ref 3.5–5.0)
Alkaline Phosphatase: 57 U/L (ref 38–126)
BUN: 7 mg/dL (ref 6–20)
CHLORIDE: 102 mmol/L (ref 101–111)
CO2: 27 mmol/L (ref 22–32)
Calcium: 9.2 mg/dL (ref 8.9–10.3)
Creatinine, Ser: 0.75 mg/dL (ref 0.61–1.24)
GFR calc Af Amer: >60 mL/min (ref >60)
Glucose, Bld: 88 mg/dL (ref 65–99)
POTASSIUM: 4 mmol/L (ref 3.5–5.1)
Sodium: 136 mmol/L (ref 135–145)
TOTAL PROTEIN: 7.7 g/dL (ref 6.5–8.1)
Total Bilirubin: 0.9 mg/dL (ref 0.3–1.2)

## 2016-01-23 LAB — CBC WITH DIFFERENTIAL/PLATELET
BASOS ABS: 0 10*3/uL (ref 0.0–0.1)
Basophils Relative: 0 %
EOS PCT: 3 %
Eosinophils Absolute: 0.1 10*3/uL (ref 0.0–0.7)
HEMATOCRIT: 39.1 % (ref 39.0–52.0)
HEMOGLOBIN: 12.9 g/dL — AB (ref 13.0–17.0)
LYMPHS PCT: 39 %
Lymphs Abs: 2 10*3/uL (ref 0.7–4.0)
MCH: 31.9 pg (ref 26.0–34.0)
MCHC: 33 g/dL (ref 30.0–36.0)
MCV: 96.5 fL (ref 78.0–100.0)
Monocytes Absolute: 0.6 10*3/uL (ref 0.1–1.0)
Monocytes Relative: 12 %
NEUTROS ABS: 2.4 10*3/uL (ref 1.7–7.7)
NEUTROS PCT: 46 %
PLATELETS: 192 10*3/uL (ref 150–400)
RBC: 4.05 MIL/uL — AB (ref 4.22–5.81)
RDW: 12.5 % (ref 11.5–15.5)
WBC: 5.2 10*3/uL (ref 4.0–10.5)

## 2016-01-23 LAB — I-STAT TROPONIN, ED: TROPONIN I, POC: 0 ng/mL (ref 0.00–0.08)

## 2016-01-23 NOTE — ED Notes (Signed)
No answer for room or vitals update.

## 2016-01-23 NOTE — ED Triage Notes (Signed)
Pt to ED with multiple complaints.  St's he had rectal bleeding in the past, last time was approx 2 months ago but wants it checked out.  Also c/o central chest pain that started this pm.  Also st's he has been having abd cramping for "a long time"

## 2016-07-15 NOTE — Congregational Nurse Program (Signed)
Congregational Nurse Program Note  Date of Encounter: 07/15/2016  Past Medical History: Past Medical History:  Diagnosis Date  . Pneumonia     Encounter Details:     CNP Questionnaire - 07/15/16 1151      Patient Demographics   Is this a new or existing patient? New   Patient is considered a/an Not Applicable   Race African-American/Black     Patient Assistance   Location of Patient Assistance Not Applicable   Patient's financial/insurance status Low Income;Self-Pay (Uninsured)   Uninsured Patient ([place]) Yes   Patient referred to apply for the following financial assistance [place] insecurities addressed Not Research scientist (physical sciences) No   Assistance securing medications No   Educational health offerings Behavioral health;Chronic disease     Encounter Details   Primary purpose of visit Chronic Illness/Condition Visit;Navigating the Healthcare System;Spiritual Care/Support Visit   Was an Emergency Department visit averted? Not Applicable   Does patient have a medical provider? Yes   Patient referred to Clinic   Was a mental health screening completed? (GAINS tool) No   Does patient have dental issues? No   Does patient have vision issues? No   Does your patient have an abnormal blood pressure today? No   Since previous encounter, have you referred patient for abnormal blood pressure that resulted in a new diagnosis or medication change? No   Does your patient have an abnormal blood glucose today? No   Since previous encounter, have you referred patient for abnormal blood glucose that resulted in a new diagnosis or medication change? No   Was there a life-saving intervention made? No     Client requesting assistance with getting back on his medications and applying for Medicaid.  Referred client to Marliss Coots NP, his provider and assisted with making the appointment.  Client was seen today.  Also  referred to the social work intern to discuss resources.  Client does have an expired orange card.  Social Work Theatre manager will assist with the renewal

## 2017-12-10 ENCOUNTER — Ambulatory Visit (HOSPITAL_COMMUNITY)
Admission: EM | Admit: 2017-12-10 | Discharge: 2017-12-10 | Disposition: A | Discharge status: Home / Self Care | Payer: Self-pay | Attending: Family Medicine | Admitting: Family Medicine

## 2017-12-10 ENCOUNTER — Encounter (HOSPITAL_COMMUNITY): Payer: Self-pay

## 2017-12-10 DIAGNOSIS — S61213A Laceration without foreign body of left middle finger without damage to nail, initial encounter: Secondary | ICD-10-CM

## 2017-12-10 DIAGNOSIS — W260XXA Contact with knife, initial encounter: Secondary | ICD-10-CM

## 2017-12-10 DIAGNOSIS — B49 Unspecified mycosis: Secondary | ICD-10-CM

## 2017-12-10 DIAGNOSIS — Z23 Encounter for immunization: Secondary | ICD-10-CM

## 2017-12-10 DIAGNOSIS — K625 Hemorrhage of anus and rectum: Secondary | ICD-10-CM

## 2017-12-10 MED ORDER — KETOCONAZOLE 2 % EX CREA: 1.0000 "application " | TOPICAL_CREAM | Freq: Two times a day (BID) | CUTANEOUS | 0 refills | Status: DC

## 2017-12-10 MED ORDER — TETANUS-DIPHTH-ACELL PERTUSSIS 5-2.5-18.5 LF-MCG/0.5 IM SUSP
INTRAMUSCULAR | Status: AC
  Filled 2017-12-10: qty 0.5

## 2017-12-10 MED ORDER — TETANUS-DIPHTH-ACELL PERTUSSIS 5-2.5-18.5 LF-MCG/0.5 IM SUSP
0.5000 mL | Freq: Once | INTRAMUSCULAR | Status: AC
  Administered 2017-12-10: 0.5 mL via INTRAMUSCULAR

## 2017-12-10 NOTE — ED Provider Notes (Signed)
Chase Parsons   [id number] 12/10/17 Arrival Time: 1827  CC: Rectal bleeding and laceration  SUBJECTIVE: History obtained from patient and nephews  Chase Parsons is a 62 y.o. male who presents with complaint of rectal bleeding that began a couple of weeks ago.  Denies a precipitating event, or trauma.  Describes as  blood with wiping and in the commode.  Has not tried OTC medications.  Denies hx of hemorrhoids.  Worse with wiping.    Patient also presents with laceration to third left finger that occurred today after he cut himself with a knife.  Admits to drinking alcohol today.  Unknown last tetanus shot.    Does not complain of fever, chills, nausea, vomiting, chest pain, SOB, diarrhea, constipation, hematochezia, melena, dysuria, difficulty urinating, increased frequency or urgency, flank pain, loss of bowel or bladder function.   No LMP for male patient.  ROS: As per HPI.  Past Medical History:  Diagnosis Date  . Pneumonia    History reviewed. No pertinent surgical history. No Known Allergies No current facility-administered medications on file prior to encounter.    Current Outpatient Medications on File Prior to Encounter  Medication Sig Dispense Refill  . FLUoxetine (PROZAC) 10 MG capsule Take 1 capsule (10 mg total) by mouth daily. 30 capsule 0  . nicotine (NICODERM CQ - DOSED IN MG/24 HOURS) 21 mg/24hr patch Place 1 patch (21 mg total) onto the skin daily. 28 patch 0  . traZODone (DESYREL) 50 MG tablet Take 1 tablet (50 mg total) by mouth at bedtime. 30 tablet 0   Social History   Socioeconomic History  . Marital status: Single    Spouse name: Not on file  . Number of children: Not on file  . Years of education: Not on file  . Highest education level: Not on file  Occupational History  . Not on file  Social Needs  . Financial resource strain: Not on file  . Food insecurity:    Worry: Not on file    Inability: Not on file  . Transportation needs:      Medical: Not on file    Non-medical: Not on file  Tobacco Use  . Smoking status: Never Smoker  . Smokeless tobacco: Never Used  Substance and Sexual Activity  . Alcohol use: Yes  . Drug use: No  . Sexual activity: Not on file  Lifestyle  . Physical activity:    Days per week: Not on file    Minutes per session: Not on file  . Stress: Not on file  Relationships  . Social connections:    Talks on phone: Not on file    Gets together: Not on file    Attends religious service: Not on file    Active member of club or organization: Not on file    Attends meetings of clubs or organizations: Not on file    Relationship status: Not on file  . Intimate partner violence:    Fear of current or ex partner: Not on file    Emotionally abused: Not on file    Physically abused: Not on file    Forced sexual activity: Not on file  Other Topics Concern  . Not on file  Social History Narrative  . Not on file   History reviewed. No pertinent family history.   OBJECTIVE:  Vitals:   12/10/17 1839  BP: 117/84  Pulse: (!) 120  Resp: 20  Temp: 98.4 F (36.9 C)  TempSrc: Oral  SpO2:  99%    General appearance: Disoriented; wandering in hallway; appears intoxicated; improved by end of encounter HEENT: NCAT.  Oropharynx clear.  Lungs: clear to auscultation bilaterally without adventitious breath sounds Heart: regular rate and rhythm.  Radial pulses 2+ symmetrical bilaterally Abdomen: soft, non-distended; normal active bowel sounds; non-tender to light and deep palpation; no guarding Rectal: External exam positive for 1cm area of maceration of the superior gluteal cleft; mild tenderness with palpation; no active drainage; no surrounding erythema.  Internal rectal exam deferred Extremities: no edema; symmetrical with no gross deformities Skin: laceration of left third finger; size: approx 1 cm; FROM about the digit; cap refill <2 seconds Neurologic: normal gait  Procedure: Verbal consent  obtained. Patient provided with risks and alternatives to the procedure. Wound copiously irrigated with tap water then cleansed with betadine. Anesthetized with approximately 5 mL of lidocaine without epinephrine. Wound carefully explored. No foreign body, tendon injury, or nonviable tissue were noted. Using sterile technique 2 horizontal sutures 4-0 Ethilon Prolene sutures were placed to reapproximate the wound. Patient tolerated procedure well. No complications. Minimal bleeding. Patient advised to look for and return for any signs of infection such as redness, swelling, discharge, or worsening pain. Return for suture removal in 7 days.  ASSESSMENT & PLAN:  1. Rectal bleeding   2. Laceration of left middle finger without foreign body without damage to nail, initial encounter   3. Fungal infection    Consulted Dr. Joseph Art during the care and management of this patient.    Meds ordered this encounter  Medications  . Tdap (BOOSTRIX) injection 0.5 mL  . DISCONTD: ketoconazole (NIZORAL) 2 % cream    Sig: Apply 1 application topically 2 (two) times daily for 14 days.    Dispense:  60 g    Refill:  0    Order Specific Question:   Supervising Provider    Answer:   Wynona Luna [[id number]]  . ketoconazole (NIZORAL) 2 % cream    Sig: Apply 1 application topically 2 (two) times daily for 14 days.    Dispense:  60 g    Refill:  0    Order Specific Question:   Supervising Provider    Answer:   Wynona Luna [[id number]]    Tetanus updated Follow up in 7 days to have sutures removed Wash with mild soap and water, but avoid submerging in water Keep covered to avoid secondary infection Return sooner or go to the ED if you have any new or worsening symptoms such as increased pain, redness, swelling, drainage, fever, chills, nausea, or vomiting.    Ketoconazole cream prescribed.  Apply to bottom twice daily for 14 days Follow up with PCP or community health and wellness if symptoms  persists Return or go to the ED if you have any new or worsening symptoms  Reviewed expectations re: course of current medical issues. Questions answered. Outlined signs and symptoms indicating need for more acute intervention. Patient verbalized understanding. After Visit Summary given.   Lestine Box, PA-C 12/10/17 2033

## 2017-12-10 NOTE — Discharge Instructions (Signed)
Tetanus updated Follow up in 7 days to have sutures removed Wash with mild soap and water, but avoid submerging in water Keep covered to avoid secondary infection Return sooner or go to the ED if you have any new or worsening symptoms such as increased pain, redness, swelling, drainage, fever, chills, nausea, or vomiting.    Ketoconazole cream prescribed.  Apply to bottom twice daily for 14 days Follow up with PCP or community health and wellness if symptoms persists Return or go to the ED if you have any new or worsening symptoms

## 2017-12-10 NOTE — ED Triage Notes (Signed)
Pt presents with rectal bleeding and a finger laceration to left middle finger

## 2017-12-22 ENCOUNTER — Encounter (HOSPITAL_COMMUNITY): Payer: Self-pay

## 2017-12-22 ENCOUNTER — Emergency Department (HOSPITAL_COMMUNITY)
Admission: EM | Admit: 2017-12-22 | Discharge: 2017-12-22 | Discharge status: Against Medical Advice | Payer: Self-pay | Attending: Emergency Medicine | Admitting: Emergency Medicine

## 2017-12-22 ENCOUNTER — Emergency Department (HOSPITAL_COMMUNITY): Payer: Self-pay

## 2017-12-22 DIAGNOSIS — D539 Nutritional anemia, unspecified: Secondary | ICD-10-CM

## 2017-12-22 DIAGNOSIS — R55 Syncope and collapse: Secondary | ICD-10-CM

## 2017-12-22 DIAGNOSIS — Z79899 Other long term (current) drug therapy: Secondary | ICD-10-CM | POA: Insufficient documentation

## 2017-12-22 DIAGNOSIS — F10120 Alcohol abuse with intoxication, uncomplicated: Secondary | ICD-10-CM | POA: Insufficient documentation

## 2017-12-22 DIAGNOSIS — E876 Hypokalemia: Secondary | ICD-10-CM

## 2017-12-22 DIAGNOSIS — F10920 Alcohol use, unspecified with intoxication, uncomplicated: Secondary | ICD-10-CM

## 2017-12-22 LAB — CBC WITH DIFFERENTIAL/PLATELET
Abs Immature Granulocytes: 0 10*3/uL (ref 0.0–0.1)
BASOS ABS: 0 10*3/uL (ref 0.0–0.1)
BASOS PCT: 1 %
EOS ABS: 0.1 10*3/uL (ref 0.0–0.7)
Eosinophils Relative: 2 %
HCT: 34 % — ABNORMAL LOW (ref 39.0–52.0)
Hemoglobin: 11.4 g/dL — ABNORMAL LOW (ref 13.0–17.0)
Immature Granulocytes: 0 %
Lymphocytes Relative: 49 %
Lymphs Abs: 1.9 10*3/uL (ref 0.7–4.0)
MCH: 33.8 pg (ref 26.0–34.0)
MCHC: 33.5 g/dL (ref 30.0–36.0)
MCV: 100.9 fL — ABNORMAL HIGH (ref 78.0–100.0)
MONO ABS: 0.5 10*3/uL (ref 0.1–1.0)
Monocytes Relative: 11 %
Neutro Abs: 1.5 10*3/uL — ABNORMAL LOW (ref 1.7–7.7)
Neutrophils Relative %: 37 %
PLATELETS: 116 10*3/uL — AB (ref 150–400)
RBC: 3.37 MIL/uL — ABNORMAL LOW (ref 4.22–5.81)
RDW: 12.5 % (ref 11.5–15.5)
WBC: 4 10*3/uL (ref 4.0–10.5)

## 2017-12-22 LAB — URINALYSIS, ROUTINE W REFLEX MICROSCOPIC
Bilirubin Urine: NEGATIVE
GLUCOSE, UA: NEGATIVE mg/dL
Hgb urine dipstick: NEGATIVE
KETONES UR: NEGATIVE mg/dL
LEUKOCYTES UA: NEGATIVE
Nitrite: NEGATIVE
PROTEIN: NEGATIVE mg/dL
Specific Gravity, Urine: 1.005 (ref 1.005–1.030)
pH: 6 (ref 5.0–8.0)

## 2017-12-22 LAB — POC OCCULT BLOOD, ED: FECAL OCCULT BLD: NEGATIVE

## 2017-12-22 LAB — BASIC METABOLIC PANEL
ANION GAP: 10 (ref 5–15)
BUN: 5 mg/dL — ABNORMAL LOW (ref 8–23)
CALCIUM: 9 mg/dL (ref 8.9–10.3)
CO2: 25 mmol/L (ref 22–32)
Chloride: 107 mmol/L (ref 98–111)
Creatinine, Ser: 0.67 mg/dL (ref 0.61–1.24)
GFR calc Af Amer: >60 mL/min (ref >60)
GLUCOSE: 113 mg/dL — AB (ref 70–99)
Potassium: 2.9 mmol/L — ABNORMAL LOW (ref 3.5–5.1)
Sodium: 142 mmol/L (ref 135–145)

## 2017-12-22 LAB — ETHANOL: Alcohol, Ethyl (B): 220 mg/dL — ABNORMAL HIGH (ref <10)

## 2017-12-22 MED ORDER — POTASSIUM CHLORIDE 10 MEQ/100ML IV SOLN: 10.0000 meq | Freq: Once | INTRAVENOUS | Status: DC

## 2017-12-22 MED ORDER — POTASSIUM CHLORIDE CRYS ER 20 MEQ PO TBCR
40.0000 meq | EXTENDED_RELEASE_TABLET | Freq: Once | ORAL | Status: AC
  Administered 2017-12-22: 40 meq via ORAL
  Filled 2017-12-22: qty 2

## 2017-12-22 MED ORDER — POTASSIUM CHLORIDE CRYS ER 20 MEQ PO TBCR: 20.0000 meq | EXTENDED_RELEASE_TABLET | Freq: Two times a day (BID) | ORAL | 0 refills | Status: DC

## 2017-12-22 NOTE — ED Provider Notes (Signed)
[facility] EMERGENCY DEPARTMENT Provider Note   CSN: [id number] Arrival date & time: 12/22/17  0245     History   Chief Complaint Chief Complaint  Patient presents with  . Loss of Consciousness  . Chest Pain    HPI Chase Parsons is a 62 y.o. male.  The history is provided by the patient.  He has a history of depression and comes in with complaints of chest pain, rectal bleeding, syncope.  He is a very poor and evasive historian, but states that he felt sick and was walking to his nephew's room when he started feeling shaky and passed out.  He states he was still shaking when he had passed out.  He is not willing to give me any additional history.  He has been having problems with nausea and vomiting for an indeterminate period of time.  He is also been having chest pains over the last 6-7 months.  He does admit to drinking 40 ounces of beer today.  He has been having rectal bleeding stating that there is red blood in his stool, but he will not give any additional information.  Past Medical History:  Diagnosis Date  . Pneumonia     Patient Active Problem List   Diagnosis Date Noted  . MDD (major depressive disorder), single episode, severe , no psychosis ([place]) 01/26/2015  . Severe single current episode of major depressive disorder, without psychotic features ([place]) 01/26/2015  . Suicidal thoughts     History reviewed. No pertinent surgical history.      Home Medications    Prior to Admission medications   Medication Sig Start Date End Date Taking? Authorizing Provider  FLUoxetine (PROZAC) 10 MG capsule Take 1 capsule (10 mg total) by mouth daily. 01/28/15   Niel Hummer, NP  ketoconazole (NIZORAL) 2 % cream Apply 1 application topically 2 (two) times daily for 14 days. 12/10/17 12/24/17  Wurst, [place], PA-C  nicotine (NICODERM CQ - DOSED IN MG/24 HOURS) 21 mg/24hr patch Place 1 patch (21 mg total) onto the skin daily. 01/28/15   Niel Hummer, NP    traZODone (DESYREL) 50 MG tablet Take 1 tablet (50 mg total) by mouth at bedtime. 01/28/15   Niel Hummer, NP    Family History History reviewed. No pertinent family history.  Social History Social History   Tobacco Use  . Smoking status: Never Smoker  . Smokeless tobacco: Never Used  Substance Use Topics  . Alcohol use: Yes    Alcohol/week: 6.0 standard drinks    Types: 6 Cans of beer per week  . Drug use: No     Allergies   Patient has no known allergies.   Review of Systems Review of Systems  All other systems reviewed and are negative.    Physical Exam Updated Vital Signs BP 124/79 (BP Location: Right Arm)   Pulse 99   Temp 97.8 F (36.6 C) (Oral)   Resp 16   Ht 5\' 10"  (1.778 m)   Wt 68 kg   SpO2 97%   BMI 21.52 kg/m   Physical Exam  Nursing note and vitals reviewed.  62 year old male, resting comfortably and in no acute distress. Vital signs are normal. Oxygen saturation is 97%, which is normal. Head is normocephalic and atraumatic. PERRLA, EOMI. Oropharynx is clear. Neck is nontender and supple without adenopathy or JVD. Back is nontender and there is no CVA tenderness. Lungs are clear without rales, wheezes, or rhonchi. Chest is  nontender. Heart has regular rate and rhythm without murmur. Abdomen is soft, flat, nontender without masses or hepatosplenomegaly and peristalsis is normoactive. Rectal: Normal sphincter tone, small amount of brown stool present, no gross blood. Extremities have no cyanosis or edema, full range of motion is present. Skin is warm and dry without rash. Neurologic: Mental status is normal, cranial nerves are intact, there are no motor or sensory deficits.  ED Treatments / Results  Labs (all labs ordered are listed, but only abnormal results are displayed) Labs Reviewed  BASIC METABOLIC PANEL  CBC  URINALYSIS, ROUTINE W REFLEX MICROSCOPIC  I-STAT TROPONIN, ED  CBG MONITORING, ED    EKG EKG  Interpretation  Date/Time:  Wednesday December 22 2017 02:56:14 EDT Ventricular Rate:  101 PR Interval:    QRS Duration: 103 QT Interval:  376 QTC Calculation: 488 R Axis:   64 Text Interpretation:  Sinus tachycardia Borderline prolonged QT interval When compared with ECG of 01/23/2016, QT has lengthened Rightward axis is no longer present Confirmed by Delora Fuel ([id number]) on 12/22/2017 3:07:06 AM   Radiology No results found.  Procedures Procedures  Medications Ordered in ED Medications - No data to display   Initial Impression / Assessment and Plan / ED Course  I have reviewed the triage vital signs and the nursing notes.  Pertinent labs & imaging results that were available during my care of the patient were reviewed by me and considered in my medical decision making (see chart for details).  Apparent syncopal episode.  Chronic chest pain.  History of rectal bleeding.  Old records are reviewed, and he had been seen at urgent care 12 days ago with complaints of rectal bleeding, but digital rectal exam had not been done and no hemoglobin obtained.  Today, hemoglobin is 11.4 with macrocytic RBC indices, last prior hemoglobin was 12.9 in 2017.  ECG shows minimally prolonged QT interval and this is not felt to be relevant to his syncopal episode.  We will check orthostatic vital signs and also will get CT of head.  Because of inability to obtain satisfactory history, anticipate need to admit for further work-up.  Stool is noted to be Hemoccult negative.  Vital signs remained stable, but orthostatic vital signs had not been obtained.  Head CT was unremarkable per my reading with radiologist interpretation pending.  He was noted to be significantly hypokalemic and was given oral and intravenous potassium.  Arrangements were made to admit the patient, but he stated that he wanted to leave and was willing to leave [place].  He is given prescription for oral potassium and advised to  return should he change his mind.  Final Clinical Impressions(s) / ED Diagnoses   Final diagnoses:  Syncope, unspecified syncope type  Alcohol intoxication, uncomplicated ([place])  Hypokalemia  Macrocytic anemia    ED Discharge Orders         Ordered    potassium chloride SA (K-DUR,KLOR-CON) 20 MEQ tablet  2 times daily     12/22/17 5374           Delora Fuel, MD 82/70/78 [phone]

## 2017-12-22 NOTE — ED Triage Notes (Signed)
Pt coming from home by ems. Pt was not feeling well when he was walking to his nephew and then pt had a syncopal episode. Pt was on the ground for about a minute when pt came to it. Pt remembers not feeling well, having some abdominal pain and having tarry stools for the past week. Pt remembers not hitting his head. Pt also c/o chest pain that has been happening for over a month.

## 2017-12-22 NOTE — Discharge Instructions (Addendum)
You left the hospital against my medical advice.  I feel you should be observed for further testing regarding your passing out.  If you change your mind, please return at any time.

## 2017-12-22 NOTE — ED Notes (Signed)
Pt wanted to leave and not stay pt understood discharge and leaving against medical advice.

## 2018-04-01 NOTE — Congregational Nurse Program (Signed)
Client presented with multiple concerns including health, housing and disability needs.  Affect depressed and speech slowed.  Stated priority is health.  Instructed to come to clinic on Monday morning, December 9 to see Marliss Coots NP

## 2018-10-07 ENCOUNTER — Emergency Department (HOSPITAL_COMMUNITY)
Admission: EM | Admit: 2018-10-07 | Discharge: 2018-10-07 | Disposition: A | Discharge status: Home / Self Care | Payer: Medicaid Other | Attending: Emergency Medicine | Admitting: Emergency Medicine

## 2018-10-07 ENCOUNTER — Emergency Department (HOSPITAL_COMMUNITY): Payer: Medicaid Other

## 2018-10-07 ENCOUNTER — Other Ambulatory Visit: Payer: Self-pay

## 2018-10-07 ENCOUNTER — Encounter (HOSPITAL_COMMUNITY): Payer: Self-pay | Admitting: Emergency Medicine

## 2018-10-07 DIAGNOSIS — R0789 Other chest pain: Secondary | ICD-10-CM | POA: Insufficient documentation

## 2018-10-07 DIAGNOSIS — F10120 Alcohol abuse with intoxication, uncomplicated: Secondary | ICD-10-CM | POA: Insufficient documentation

## 2018-10-07 DIAGNOSIS — Y908 Blood alcohol level of 240 mg/100 ml or more: Secondary | ICD-10-CM | POA: Diagnosis not present

## 2018-10-07 DIAGNOSIS — F1010 Alcohol abuse, uncomplicated: Secondary | ICD-10-CM

## 2018-10-07 DIAGNOSIS — R0602 Shortness of breath: Secondary | ICD-10-CM | POA: Diagnosis not present

## 2018-10-07 DIAGNOSIS — R112 Nausea with vomiting, unspecified: Secondary | ICD-10-CM | POA: Insufficient documentation

## 2018-10-07 DIAGNOSIS — R4182 Altered mental status, unspecified: Secondary | ICD-10-CM | POA: Diagnosis present

## 2018-10-07 DIAGNOSIS — R1012 Left upper quadrant pain: Secondary | ICD-10-CM | POA: Diagnosis not present

## 2018-10-07 DIAGNOSIS — F10920 Alcohol use, unspecified with intoxication, uncomplicated: Secondary | ICD-10-CM

## 2018-10-07 DIAGNOSIS — R1013 Epigastric pain: Secondary | ICD-10-CM | POA: Insufficient documentation

## 2018-10-07 LAB — CBC WITH DIFFERENTIAL/PLATELET
Abs Immature Granulocytes: 0.01 10*3/uL (ref 0.00–0.07)
Basophils Absolute: 0 10*3/uL (ref 0.0–0.1)
Basophils Relative: 1 %
Eosinophils Absolute: 0.1 10*3/uL (ref 0.0–0.5)
Eosinophils Relative: 1 %
HCT: 35.7 % — ABNORMAL LOW (ref 39.0–52.0)
Hemoglobin: 11.7 g/dL — ABNORMAL LOW (ref 13.0–17.0)
Immature Granulocytes: 0 %
Lymphocytes Relative: 56 %
Lymphs Abs: 2.3 10*3/uL (ref 0.7–4.0)
MCH: 33.7 pg (ref 26.0–34.0)
MCHC: 32.8 g/dL (ref 30.0–36.0)
MCV: 102.9 fL — ABNORMAL HIGH (ref 80.0–100.0)
Monocytes Absolute: 0.3 10*3/uL (ref 0.1–1.0)
Monocytes Relative: 7 %
Neutro Abs: 1.4 10*3/uL — ABNORMAL LOW (ref 1.7–7.7)
Neutrophils Relative %: 35 %
Platelets: 131 10*3/uL — ABNORMAL LOW (ref 150–400)
RBC: 3.47 MIL/uL — ABNORMAL LOW (ref 4.22–5.81)
RDW: 13.1 % (ref 11.5–15.5)
WBC: 4.2 10*3/uL (ref 4.0–10.5)
nRBC: 0 % (ref 0.0–0.2)

## 2018-10-07 LAB — COMPREHENSIVE METABOLIC PANEL
ALT: 80 U/L — ABNORMAL HIGH (ref 0–44)
AST: 142 U/L — ABNORMAL HIGH (ref 15–41)
Albumin: 3.9 g/dL (ref 3.5–5.0)
Alkaline Phosphatase: 222 U/L — ABNORMAL HIGH (ref 38–126)
Anion gap: 12 (ref 5–15)
BUN: 5 mg/dL — ABNORMAL LOW (ref 8–23)
CO2: 23 mmol/L (ref 22–32)
Calcium: 8.6 mg/dL — ABNORMAL LOW (ref 8.9–10.3)
Chloride: 107 mmol/L (ref 98–111)
Creatinine, Ser: 0.71 mg/dL (ref 0.61–1.24)
GFR calc Af Amer: >60 mL/min (ref >60)
GFR calc non Af Amer: >60 mL/min (ref >60)
Glucose, Bld: 96 mg/dL (ref 70–99)
Potassium: 3.4 mmol/L — ABNORMAL LOW (ref 3.5–5.1)
Sodium: 142 mmol/L (ref 135–145)
Total Bilirubin: 0.7 mg/dL (ref 0.3–1.2)
Total Protein: 7.6 g/dL (ref 6.5–8.1)

## 2018-10-07 LAB — ETHANOL: Alcohol, Ethyl (B): 378 mg/dL (ref <10)

## 2018-10-07 LAB — ACETAMINOPHEN LEVEL: Acetaminophen (Tylenol), Serum: <10 ug/mL — ABNORMAL LOW (ref 10–30)

## 2018-10-07 LAB — LIPASE, BLOOD: Lipase: 63 U/L — ABNORMAL HIGH (ref 11–51)

## 2018-10-07 LAB — TROPONIN I: Troponin I: <0.03 ng/mL (ref <0.03)

## 2018-10-07 LAB — SALICYLATE LEVEL: Salicylate Lvl: <7 mg/dL (ref 2.8–30.0)

## 2018-10-07 LAB — LACTIC ACID, PLASMA: Lactic Acid, Venous: 1.8 mmol/L (ref 0.5–1.9)

## 2018-10-07 MED ORDER — FAMOTIDINE IN NACL 20-0.9 MG/50ML-% IV SOLN
20.0000 mg | Freq: Once | INTRAVENOUS | Status: AC
  Administered 2018-10-07: 20 mg via INTRAVENOUS
  Filled 2018-10-07: qty 50

## 2018-10-07 MED ORDER — ONDANSETRON HCL 4 MG/2ML IJ SOLN
4.0000 mg | Freq: Once | INTRAMUSCULAR | Status: AC
  Administered 2018-10-07: 4 mg via INTRAVENOUS
  Filled 2018-10-07: qty 2

## 2018-10-07 MED ORDER — ALUM & MAG HYDROXIDE-SIMETH 200-200-20 MG/5ML PO SUSP
30.0000 mL | Freq: Once | ORAL | Status: AC
  Administered 2018-10-07: 30 mL via ORAL
  Filled 2018-10-07: qty 30

## 2018-10-07 MED ORDER — SODIUM CHLORIDE 0.9 % IV BOLUS
1000.0000 mL | Freq: Once | INTRAVENOUS | Status: AC
  Administered 2018-10-07: 1000 mL via INTRAVENOUS

## 2018-10-07 NOTE — ED Notes (Signed)
Pt now ripped his IV out. Pt is being uncooperative and yelling at staff.

## 2018-10-07 NOTE — ED Triage Notes (Signed)
Pt brought in by [facility]. Pt is from home. Pt states he is an alcoholic and drank too much and is not feeling well. Pt states he also hasnt eaten in a week.

## 2018-10-07 NOTE — ED Notes (Signed)
Pt is ambulatory in the room. Pt will not sit back in the bed at this time

## 2018-10-07 NOTE — ED Provider Notes (Signed)
[facility] EMERGENCY DEPARTMENT Provider Note   CSN: [id number] Arrival date & time: 10/07/18  1414    History   Chief Complaint Chief Complaint  Patient presents with  . Altered Mental Status  . Weakness  . Alcohol Intoxication    HPI Yisroel Mullendore is a 63 y.o. male.     HPI  Patient is 63 year old male with a history of MDD, suicidal thoughts, who presents the emergency department today for evaluation of multiple complaints.  Per EMS, patient coming from home.  Stating that he is an alcoholic and drank too much and is not feeling well.  He also inform them that he had not eaten in a week.  Per nursing staff, patient told him that he drank "a lot a lot a lot "of liquor today.  On my evaluation, patient is a very difficult historian and will not answer any of my questions.  He keeps repeating I am sick I am sick I am sick.  Reports that he has had chest pain and abdominal pain reports "I cannot eat ".  States he has been vomiting.  He does not elaborate on his alcohol use to me and actually denies drinking today.  Hx limited due to pt not participating in the history.   Past Medical History:  Diagnosis Date  . Pneumonia     Patient Active Problem List   Diagnosis Date Noted  . MDD (major depressive disorder), single episode, severe , no psychosis ([place]) 01/26/2015  . Severe single current episode of major depressive disorder, without psychotic features ([place]) 01/26/2015  . Suicidal thoughts     History reviewed. No pertinent surgical history.      Home Medications    Prior to Admission medications   Medication Sig Start Date End Date Taking? Authorizing Provider  FLUoxetine (PROZAC) 10 MG capsule Take 1 capsule (10 mg total) by mouth daily. Patient not taking: Reported on 12/22/2017 01/28/15   Elmarie Shiley A, NP  potassium chloride SA (K-DUR,KLOR-CON) 20 MEQ tablet Take 1 tablet (20 mEq total) by mouth 2 (two) times daily. 08/13/60   Delora Fuel, MD  traZODone (DESYREL) 50 MG tablet Take 1 tablet (50 mg total) by mouth at bedtime. Patient not taking: Reported on 12/22/2017 01/28/15   Niel Hummer, NP    Family History No family history on file.  Social History Social History   Tobacco Use  . Smoking status: Never Smoker  . Smokeless tobacco: Never Used  Substance Use Topics  . Alcohol use: Yes    Alcohol/week: 6.0 standard drinks    Types: 6 Cans of beer per week  . Drug use: No     Allergies   Patient has no known allergies.   Review of Systems Review of Systems  Reason unable to perform ROS: pt is a limited historian.  Respiratory: Positive for shortness of breath.   Cardiovascular: Positive for chest pain.  Gastrointestinal: Positive for abdominal pain, nausea and vomiting.  All other systems reviewed and are negative.    Physical Exam Updated Vital Signs BP 129/86 (BP Location: Right Arm)   Pulse (!) 114   Temp 98.3 F (36.8 C) (Oral)   Resp 16   Ht 5\' 9"  (1.753 m)   SpO2 97%   BMI 22.15 kg/m   Physical Exam Vitals signs and nursing note reviewed.  Constitutional:      General: He is not in acute distress.    Appearance: He is well-developed. He is not  ill-appearing or toxic-appearing.  HENT:     Head: Normocephalic and atraumatic.  Eyes:     Conjunctiva/sclera: Conjunctivae normal.  Neck:     Musculoskeletal: Neck supple.  Cardiovascular:     Rate and Rhythm: Regular rhythm. Tachycardia present.     Pulses: Normal pulses.     Heart sounds: Normal heart sounds. No murmur.  Pulmonary:     Effort: Pulmonary effort is normal. No respiratory distress.     Breath sounds: Normal breath sounds. No wheezing, rhonchi or rales.  Abdominal:     General: Bowel sounds are normal.     Palpations: Abdomen is soft.     Tenderness: There is abdominal tenderness (epigastric, luq). There is guarding. There is no rebound.  Musculoskeletal:     Right lower leg: No edema.     Left lower leg: No  edema.  Skin:    General: Skin is warm and dry.  Neurological:     Mental Status: He is alert.     Comments: Alert, appears intoxicated. Moving all extremities, follows some commands but refuses to fall all commands or fully participate in neuro exam. No facial droop or obvious asymmetric weakness.     ED Treatments / Results  Labs (all labs ordered are listed, but only abnormal results are displayed) Labs Reviewed  CBC WITH DIFFERENTIAL/PLATELET  COMPREHENSIVE METABOLIC PANEL  LIPASE, BLOOD  TROPONIN I  ETHANOL  RAPID URINE DRUG SCREEN, HOSP PERFORMED  SALICYLATE LEVEL  ACETAMINOPHEN LEVEL  LACTIC ACID, PLASMA    EKG EKG Interpretation  Date/Time:  Friday October 07 2018 15:57:32 EDT Ventricular Rate:  97 PR Interval:    QRS Duration: 98 QT Interval:  385 QTC Calculation: 490 R Axis:   75 Text Interpretation:  Sinus rhythm Low voltage, precordial leads Anteroseptal infarct, old Baseline wander in lead(s) I III aVL No significant change since last tracing Confirmed by Theotis Burrow [phone]) on 10/07/2018 4:00:32 PM   Radiology Ct Head Wo Contrast  Result Date: 10/07/2018 CLINICAL DATA:  Posttraumatic headache. EXAM: CT HEAD WITHOUT CONTRAST TECHNIQUE: Contiguous axial images were obtained from the base of the skull through the vertex without intravenous contrast. COMPARISON:  CT scan of December 22, 2017. FINDINGS: Brain: Mild diffuse cortical atrophy is noted. Mild chronic ischemic white matter disease is noted. Stable probable arachnoid cyst seen in left temporal fossa. No mass effect or midline shift is noted. Ventricular size is within normal limits. There is no evidence of hemorrhage or acute infarction. Vascular: No hyperdense vessel or unexpected calcification. Skull: Normal. Negative for fracture or focal lesion. Sinuses/Orbits: Right maxillary mucous retention cyst is noted. No acute abnormality is noted. Other: None. IMPRESSION: Mild diffuse cortical atrophy. Mild chronic  ischemic white matter disease. No acute intracranial abnormality seen. Electronically Signed   By: Marijo Conception M.D.   On: 10/07/2018 15:40   Dg Chest Portable 1 View  Result Date: 10/07/2018 CLINICAL DATA:  Pt to ED for "drinking too much and not feeling well" per RN. Per pt, "I'm just sick, man". EXAM: PORTABLE CHEST 1 VIEW COMPARISON:  12/22/2017 FINDINGS: Cardiac silhouette is normal in size. No mediastinal or hilar masses. There is no evidence of adenopathy. Clear lungs.  No pleural effusion or pneumothorax. Old healed left clavicle fracture.  No acute skeletal abnormality. IMPRESSION: No active disease. Electronically Signed   By: Lajean Manes M.D.   On: 10/07/2018 16:03    Procedures Procedures (including critical care time)  Medications Ordered in ED Medications  sodium chloride 0.9 % bolus 1,000 mL (has no administration in time range)  ondansetron (ZOFRAN) injection 4 mg (has no administration in time range)  famotidine (PEPCID) IVPB 20 mg premix (has no administration in time range)  alum & mag hydroxide-simeth (MAALOX/MYLANTA) 200-200-20 MG/5ML suspension 30 mL (has no administration in time range)     Initial Impression / Assessment and Plan / ED Course  I have reviewed the triage vital signs and the nursing notes.  Pertinent labs & imaging results that were available during my care of the patient were reviewed by me and considered in my medical decision making (see chart for details).     Final Clinical Impressions(s) / ED Diagnoses   Final diagnoses:  None   63 year old male presenting the emergency department today for evaluation of multiple complaints as listed in HPI.  He mainly continues to complain of feeling sick though does not elaborate on that.  When going to the ROS he states yes to most questions and I am unsure if this is reliable.  His lungs are clear to auscultation bilaterally.  He is somewhat tachycardic which I suspect may be related to his alcohol  use.  His abdomen is soft but he does have some tenderness to the epigastrium and left upper quadrant.  He has no lower extremity edema.  He is moving all extremities and does not have any obvious focal neurologic deficits however neuro exam is limited due to his refusal to fully participate in it.  Will initiate broad work-up with laboratory work, tox screening labs, troponin, EKG, chest x-ray and head CT.  Will give fluid bolus, nausea medicines and Pepcid.  Patient will need to be reassessed.  EKG is unchanged from prior Chest x-ray with no active disease CT of the head with no acute findings.  At shift change, remainder of work-up is pending.  Care signed out to Dr. Myriam Jacobson with plan to follow-up on the remainder of laboratory work and reassessment.  ED Discharge Orders    None       Bishop Dublin 10/07/18 1618    Sherwood Gambler, MD 10/08/18 1601

## 2018-10-07 NOTE — ED Provider Notes (Signed)
  Physical Exam  BP 129/86 (BP Location: Right Arm)   Pulse (!) 114   Temp 98.3 F (36.8 C) (Oral)   Resp 16   Ht '5\' 9"'$  (1.753 m)   SpO2 97%   BMI 22.15 kg/m   Physical Exam  ED Course/Procedures     Procedures  MDM   Transfer of Care Note I assumed care of Kevante Lunt on 10/07/2018  Briefly, Arafat Cocuzza is a 63 y.o. male who:  Chest pain, abdominal pain  Clinically intoxicated on exam, endorses ETOH use today  The plan includes:  Broad workup for AMS, reassess once metabolized   Please refer to the original provider's note for additional information regarding the care of Chase Parsons.  ### Reassessment: I personally reassessed the patient:  Vital Signs:  The most current vitals were  Vitals:   10/07/18 1420  BP: 129/86  Pulse: (!) 114  Resp: 16  Temp: 98.3 F (36.8 C)  SpO2: 97%    Hemodynamics:  The patient is tachycardic, otherwise hemodynamically stable. Mental Status:  The patient is sleeping but easily arousable  Additional MDM: Upon reassessing patient, calm, no new complaints, feeling much improved, able to tolerate p.o. intake without emesis, denies suicidal ideation, denies homicidal ideation, clinically sober CT head, chest x-ray negative, lipase 63, k 3.4, ast 142, alt 80, alk phos 222, otherwise lab work reassuring. Patient safe for discharge Patient endorsed understanding of return precautions, need for follow-up with PCP, patient discharged. The above care was discussed with and agreed upon by my attending physician.         Lonzo Candy, MD 10/07/18 [id number]    Rex Kras Wenda Overland, MD 10/09/18 [phone]

## 2018-10-07 NOTE — ED Notes (Signed)
Pt is being uncooperative at this time. Pt is refusing blood work and IV. PA notified.

## 2018-10-07 NOTE — ED Notes (Signed)
Pt refused bloodwork and EKG RN aware

## 2018-10-07 NOTE — ED Notes (Signed)
Pt given sandwich and drink.

## 2018-10-07 NOTE — ED Notes (Signed)
Patient verbalizes understanding of discharge instructions. Opportunity for questioning and answers were provided. Armband removed by staff, pt discharged from ED ambulatory in police custody to jail.

## 2018-10-07 NOTE — ED Notes (Signed)
Pt placed in a position of comfort with bed lowered. Call bell is in reach

## 2018-10-15 ENCOUNTER — Emergency Department (HOSPITAL_COMMUNITY)
Admission: EM | Admit: 2018-10-15 | Discharge: 2018-10-16 | Disposition: A | Discharge status: Home / Self Care | Payer: Medicaid Other | Attending: Emergency Medicine | Admitting: Emergency Medicine

## 2018-10-15 ENCOUNTER — Other Ambulatory Visit: Payer: Self-pay

## 2018-10-15 ENCOUNTER — Emergency Department (HOSPITAL_COMMUNITY): Payer: Medicaid Other

## 2018-10-15 ENCOUNTER — Encounter (HOSPITAL_COMMUNITY): Payer: Self-pay | Admitting: Emergency Medicine

## 2018-10-15 DIAGNOSIS — Z79899 Other long term (current) drug therapy: Secondary | ICD-10-CM | POA: Diagnosis not present

## 2018-10-15 DIAGNOSIS — F10929 Alcohol use, unspecified with intoxication, unspecified: Secondary | ICD-10-CM | POA: Diagnosis not present

## 2018-10-15 DIAGNOSIS — Y908 Blood alcohol level of 240 mg/100 ml or more: Secondary | ICD-10-CM | POA: Insufficient documentation

## 2018-10-15 DIAGNOSIS — R780 Finding of alcohol in blood: Secondary | ICD-10-CM

## 2018-10-15 DIAGNOSIS — R1084 Generalized abdominal pain: Secondary | ICD-10-CM | POA: Diagnosis present

## 2018-10-15 HISTORY — DX: Alcohol abuse, uncomplicated: F10.10

## 2018-10-15 LAB — CBC WITH DIFFERENTIAL/PLATELET
Abs Immature Granulocytes: 0.02 10*3/uL (ref 0.00–0.07)
Basophils Absolute: 0.1 10*3/uL (ref 0.0–0.1)
Basophils Relative: 1 %
Eosinophils Absolute: 0.1 10*3/uL (ref 0.0–0.5)
Eosinophils Relative: 2 %
HCT: 32.2 % — ABNORMAL LOW (ref 39.0–52.0)
Hemoglobin: 10.9 g/dL — ABNORMAL LOW (ref 13.0–17.0)
Immature Granulocytes: 0 %
Lymphocytes Relative: 53 %
Lymphs Abs: 3.6 10*3/uL (ref 0.7–4.0)
MCH: 34.7 pg — ABNORMAL HIGH (ref 26.0–34.0)
MCHC: 33.9 g/dL (ref 30.0–36.0)
MCV: 102.5 fL — ABNORMAL HIGH (ref 80.0–100.0)
Monocytes Absolute: 0.9 10*3/uL (ref 0.1–1.0)
Monocytes Relative: 13 %
Neutro Abs: 2.1 10*3/uL (ref 1.7–7.7)
Neutrophils Relative %: 31 %
Platelets: 213 10*3/uL (ref 150–400)
RBC: 3.14 MIL/uL — ABNORMAL LOW (ref 4.22–5.81)
RDW: 12.2 % (ref 11.5–15.5)
WBC: 6.8 10*3/uL (ref 4.0–10.5)
nRBC: 0 % (ref 0.0–0.2)

## 2018-10-15 LAB — COMPREHENSIVE METABOLIC PANEL
ALT: 35 U/L (ref 0–44)
AST: 48 U/L — ABNORMAL HIGH (ref 15–41)
Albumin: 3.6 g/dL (ref 3.5–5.0)
Alkaline Phosphatase: 148 U/L — ABNORMAL HIGH (ref 38–126)
Anion gap: 12 (ref 5–15)
BUN: 6 mg/dL — ABNORMAL LOW (ref 8–23)
CO2: 24 mmol/L (ref 22–32)
Calcium: 8.9 mg/dL (ref 8.9–10.3)
Chloride: 103 mmol/L (ref 98–111)
Creatinine, Ser: 0.68 mg/dL (ref 0.61–1.24)
GFR calc Af Amer: >60 mL/min (ref >60)
GFR calc non Af Amer: >60 mL/min (ref >60)
Glucose, Bld: 90 mg/dL (ref 70–99)
Potassium: 3.2 mmol/L — ABNORMAL LOW (ref 3.5–5.1)
Sodium: 139 mmol/L (ref 135–145)
Total Bilirubin: 0.5 mg/dL (ref 0.3–1.2)
Total Protein: 7.1 g/dL (ref 6.5–8.1)

## 2018-10-15 LAB — ETHANOL: Alcohol, Ethyl (B): 330 mg/dL (ref <10)

## 2018-10-15 LAB — LIPASE, BLOOD: Lipase: 42 U/L (ref 11–51)

## 2018-10-15 MED ORDER — ONDANSETRON HCL 4 MG/2ML IJ SOLN
4.0000 mg | Freq: Once | INTRAMUSCULAR | Status: AC
  Administered 2018-10-15: 4 mg via INTRAVENOUS
  Filled 2018-10-15: qty 2

## 2018-10-15 MED ORDER — SODIUM CHLORIDE 0.9 % IV BOLUS
1000.0000 mL | Freq: Once | INTRAVENOUS | Status: AC
  Administered 2018-10-15: 1000 mL via INTRAVENOUS

## 2018-10-15 MED ORDER — IOHEXOL 300 MG/ML  SOLN
100.0000 mL | Freq: Once | INTRAMUSCULAR | Status: AC | PRN
  Administered 2018-10-15: 100 mL via INTRAVENOUS

## 2018-10-15 NOTE — ED Notes (Signed)
Patient transported to CT 

## 2018-10-15 NOTE — ED Provider Notes (Signed)
[facility] EMERGENCY DEPARTMENT Provider Note   CSN: [id number] Arrival date & time: 10/15/18  1953    History   Chief Complaint Chief Complaint  Patient presents with   Abdominal Pain    HPI Chase Parsons is a 63 y.o. male.     The history is provided by the patient.  Abdominal Pain Pain location:  Generalized Pain quality: aching   Pain radiates to:  Does not radiate Pain severity:  Mild Onset quality:  Gradual Timing:  Constant Progression:  Unchanged Chronicity:  New Context: alcohol use   Relieved by:  Nothing Worsened by:  Nothing Ineffective treatments:  None tried Associated symptoms: no chest pain, no chills, no cough, no dysuria, no fever, no hematuria, no shortness of breath, no sore throat and no vomiting   Risk factors: alcohol abuse     Past Medical History:  Diagnosis Date   Alcohol abuse    Pneumonia     Patient Active Problem List   Diagnosis Date Noted   MDD (major depressive disorder), single episode, severe , no psychosis ([place]) 01/26/2015   Severe single current episode of major depressive disorder, without psychotic features ([place]) 01/26/2015   Suicidal thoughts     History reviewed. No pertinent surgical history.      Home Medications    Prior to Admission medications   Medication Sig Start Date End Date Taking? Authorizing Provider  FLUoxetine (PROZAC) 10 MG capsule Take 1 capsule (10 mg total) by mouth daily. Patient not taking: Reported on 12/22/2017 01/28/15   Elmarie Shiley A, NP  potassium chloride SA (K-DUR,KLOR-CON) 20 MEQ tablet Take 1 tablet (20 mEq total) by mouth 2 (two) times daily. 8/56/31   Delora Fuel, MD  traZODone (DESYREL) 50 MG tablet Take 1 tablet (50 mg total) by mouth at bedtime. Patient not taking: Reported on 12/22/2017 01/28/15   Niel Hummer, NP    Family History History reviewed. No pertinent family history.  Social History Social History   Tobacco Use   Smoking status:  Never Smoker   Smokeless tobacco: Never Used  Substance Use Topics   Alcohol use: Yes    Alcohol/week: 6.0 standard drinks    Types: 6 Cans of beer per week    Comment: "All day"   Drug use: No     Allergies   Patient has no known allergies.   Review of Systems Review of Systems  Constitutional: Negative for chills and fever.  HENT: Negative for ear pain and sore throat.   Eyes: Negative for pain and visual disturbance.  Respiratory: Negative for cough and shortness of breath.   Cardiovascular: Negative for chest pain and palpitations.  Gastrointestinal: Positive for abdominal pain. Negative for vomiting.  Genitourinary: Negative for dysuria and hematuria.  Musculoskeletal: Negative for arthralgias and back pain.  Skin: Negative for color change and rash.  Neurological: Negative for seizures and syncope.  All other systems reviewed and are negative.    Physical Exam Updated Vital Signs BP 126/89    Pulse 95    Temp 97.6 F (36.4 C) (Oral)    Resp 16    Ht 5\' 9"  (1.753 m)    Wt 68 kg    SpO2 96%    BMI 22.14 kg/m   Physical Exam Vitals signs and nursing note reviewed.  Constitutional:      General: He is not in acute distress.    Appearance: He is well-developed. He is not ill-appearing.  HENT:  Head: Normocephalic and atraumatic.     Mouth/Throat:     Mouth: Mucous membranes are moist.     Pharynx: Oropharynx is clear.  Eyes:     Extraocular Movements: Extraocular movements intact.     Conjunctiva/sclera: Conjunctivae normal.     Pupils: Pupils are equal, round, and reactive to light.  Neck:     Musculoskeletal: Neck supple.  Cardiovascular:     Rate and Rhythm: Normal rate and regular rhythm.     Heart sounds: Normal heart sounds. No murmur.  Pulmonary:     Effort: Pulmonary effort is normal. No respiratory distress.     Breath sounds: Normal breath sounds.  Abdominal:     General: There is no distension.     Palpations: Abdomen is soft.      Tenderness: There is generalized abdominal tenderness. There is no right CVA tenderness, left CVA tenderness, guarding or rebound. Negative signs include Murphy's sign and Rovsing's sign.  Skin:    General: Skin is warm and dry.  Neurological:     General: No focal deficit present.     Mental Status: He is alert.     Cranial Nerves: No cranial nerve deficit.     Motor: No weakness.     Comments: Patient appears intoxicated, normal strength and sensation, ambulatory      ED Treatments / Results  Labs (all labs ordered are listed, but only abnormal results are displayed) Labs Reviewed  CBC WITH DIFFERENTIAL/PLATELET - Abnormal; Notable for the following components:      Result Value   RBC 3.14 (*)    Hemoglobin 10.9 (*)    HCT 32.2 (*)    MCV 102.5 (*)    MCH 34.7 (*)    All other components within normal limits  COMPREHENSIVE METABOLIC PANEL - Abnormal; Notable for the following components:   Potassium 3.2 (*)    BUN 6 (*)    AST 48 (*)    Alkaline Phosphatase 148 (*)    All other components within normal limits  ETHANOL - Abnormal; Notable for the following components:   Alcohol, Ethyl (B) 330 (*)    All other components within normal limits  LIPASE, BLOOD  URINALYSIS, ROUTINE W REFLEX MICROSCOPIC    EKG None  Radiology Ct Abdomen Pelvis W Contrast  Result Date: 10/15/2018 CLINICAL DATA:  Abdominal pain and distension. EXAM: CT ABDOMEN AND PELVIS WITH CONTRAST TECHNIQUE: Multidetector CT imaging of the abdomen and pelvis was performed using the standard protocol following bolus administration of intravenous contrast. CONTRAST:  147mL OMNIPAQUE IOHEXOL 300 MG/ML  SOLN COMPARISON:  CT 01/25/2015 FINDINGS: Patient had difficulty tolerating the exam without motion. Lower chest: Hypoventilatory changes at the bases. No pleural fluid. Hepatobiliary: Decreased hepatic density consistent with steatosis. No focal lesion. Gallbladder physiologically distended, no calcified stone. No  biliary dilatation. Pancreas: No ductal dilatation or inflammation, motion obscures portions of the pancreatic head and uncinate process. Spleen: Normal in size without focal abnormality. Adrenals/Urinary Tract: Normal adrenal glands. Motion limited evaluation. No hydronephrosis or perinephric edema. Homogeneous renal enhancement with symmetric excretion on delayed phase imaging. Possible but not definite nonobstructing right renal stone. Urinary bladder is physiologically distended without wall thickening. Stomach/Bowel: Motion through the mid abdomen limits bowel evaluation. Allowing for this, no evidence of bowel inflammation or obstruction. Colonic diverticulosis without obvious diverticulitis. Normal appendix. Vascular/Lymphatic: Aortic atherosclerosis without aneurysm. Patent portal vein. No abdominopelvic adenopathy. Small periportal lymph nodes are likely reactive, unchanged from prior. Reproductive: Prominent prostate gland spanning  5.1 cm. Other: No free fluid or evidence of free air. Musculoskeletal: Unchanged bone island within L2. IMPRESSION: 1. No acute abnormality or explanation for abdominal pain. Moderate motion artifact through the mid abdomen. 2. Hepatic steatosis. Colonic diverticulosis without diverticulitis. 3.  Aortic Atherosclerosis (ICD10-I70.0). Electronically Signed   By: Keith Rake M.D.   On: 10/15/2018 22:34    Procedures Procedures (including critical care time)  Medications Ordered in ED Medications  ondansetron [facility]) injection 4 mg (4 mg Intravenous Given 10/15/18 2151)  sodium chloride 0.9 % bolus 1,000 mL (1,000 mLs Intravenous New Bag/Given 10/15/18 2150)  iohexol (OMNIPAQUE) 300 MG/ML solution 100 mL (100 mLs Intravenous Contrast Given 10/15/18 2204)     Initial Impression / Assessment and Plan / ED Course  I have reviewed the triage vital signs and the nursing notes.  Pertinent labs & imaging results that were available during my care of the patient were  reviewed by me and considered in my medical decision making (see chart for details).     Aerik Polan is a 63 year old male history of alcohol abuse who presents to the ED with abdominal pain.  Patient with normal vitals.  No fever.  Patient was brought in by his family member who is concerned that he had been drinking alcohol today.  Patient states that he has abdominal pain.  He will not tell me how much alcohol he drank today.  No signs of trauma on exam.  Patient is ambulatory in the room stating that he needs to go to the bathroom.  Patient with some mild abdominal tenderness on exam.  Denies any chest pain, shortness of breath.  Patient with recent evaluation for the same.  Lab work overall unremarkable.  No significant anemia, electrolyte abnormality, kidney injury.  CT scan of his abdomen and pelvis shows no acute findings.  Alcohol level is elevated 350.  Patient was given IV fluids, IV Zofran.  Will allow him to metabolize.  Denies any other drug use but suspect possibly polysubstance abuse today.  Talk to the nephew on the phone who states that his drinking has gotten worse recently and I will attach paperwork for rehab as nephew will try to get him into a rehab center.  Patient was signed out to oncoming ED staff with patient pending reevaluation.  Patient appeared to be neurologically intact.  No concern for trauma at this time.   This chart was dictated using voice recognition software.  Despite best efforts to proofread,  errors can occur which can change the documentation meaning.    Final Clinical Impressions(s) / ED Diagnoses   Final diagnoses:  Elevated blood alcohol level, blood alcohol level not specified    ED Discharge Orders    None       Lennice Sites, DO 10/15/18 2347

## 2018-10-15 NOTE — ED Notes (Signed)
Pts nephew Marta Antu [phone]

## 2018-10-15 NOTE — ED Triage Notes (Signed)
From home with c/o of abdominal pain. Hx of alcohol abuse, pt lethargic, having trouble ambulating and not answering questions willfully. When asked about last drink pt says he's been farming it all day. Nephew brought pt in. Nurse first to place number in chart.

## 2018-10-16 LAB — URINALYSIS, ROUTINE W REFLEX MICROSCOPIC
Bilirubin Urine: NEGATIVE
Glucose, UA: NEGATIVE mg/dL
Hgb urine dipstick: NEGATIVE
Ketones, ur: NEGATIVE mg/dL
Leukocytes,Ua: NEGATIVE
Nitrite: NEGATIVE
Protein, ur: NEGATIVE mg/dL
Specific Gravity, Urine: 1.021 (ref 1.005–1.030)
pH: 6 (ref 5.0–8.0)

## 2018-10-16 NOTE — ED Notes (Signed)
Pt found in doorway, out of hospital gown and changed into personal clothing, stating that he has to use restroom. Pt started urinating on the floor and into hallway. Pt helped to restroom while continuing to urinate in hallway.  Floor was cleaned, pt helped back to room and changed back into hospital gown. Condom cath applied for pt comfort and safety.

## 2018-10-16 NOTE — ED Notes (Signed)
Pt ambulates with steady gait and no assistance needed.

## 2018-10-16 NOTE — ED Notes (Signed)
Pt found standing in room yelling curse words. Pt found urinating in sink. This RN tried to help pt redirect to urinal that was given. Pt agitated and cussing in incoherent manor. Pt helped back to bed.

## 2018-10-16 NOTE — ED Notes (Signed)
Pt tolerating PO fluids. Pt also has eaten candy bar from his belongings without emesis.

## 2018-10-16 NOTE — ED Notes (Signed)
Patient verbalizes understanding of discharge instructions. Opportunity for questioning and answers were provided. Armband removed by staff, pt discharged from ED ambulatory,.   

## 2018-10-16 NOTE — ED Notes (Signed)
Pt found ambulating in room. Pt had changed back into personal clothing, removed his IV, and removed his condom catheter. Pt with incoherent and rambling speech.

## 2018-10-16 NOTE — ED Notes (Signed)
Pt refusing to wear monitoring equipment. Pt continues to remove blood pressure cuff and pulse ox. This RN educated the pt on importance of equipment. Pt continuing to refuse monitoring.

## 2018-10-16 NOTE — ED Provider Notes (Signed)
Patient signed out pending reassessment.  3:45 AM Patient still sleeping.  Upon awakening, he states that he does not want to get up.  Will encourage nursing to walk him in the hall.  5:26 AM Patient at baseline.  Nephew here to pick him up.  Patient left without formal discharge paperwork.  He ambulated independently.  After history, exam, and medical workup I feel the patient has been appropriately medically screened and is safe for discharge home. Pertinent diagnoses were discussed with the patient. Patient was given return precautions.    Merryl Hacker, MD 10/16/18 [phone]

## 2018-10-21 ENCOUNTER — Emergency Department (HOSPITAL_COMMUNITY): Payer: Medicaid Other

## 2018-10-21 ENCOUNTER — Emergency Department (HOSPITAL_COMMUNITY)
Admission: EM | Admit: 2018-10-21 | Discharge: 2018-10-22 | Disposition: A | Discharge status: Home / Self Care | Payer: Medicaid Other | Attending: Emergency Medicine | Admitting: Emergency Medicine

## 2018-10-21 ENCOUNTER — Encounter (HOSPITAL_COMMUNITY): Payer: Self-pay | Admitting: Emergency Medicine

## 2018-10-21 ENCOUNTER — Other Ambulatory Visit: Payer: Self-pay

## 2018-10-21 DIAGNOSIS — F10920 Alcohol use, unspecified with intoxication, uncomplicated: Secondary | ICD-10-CM

## 2018-10-21 DIAGNOSIS — Y929 Unspecified place or not applicable: Secondary | ICD-10-CM | POA: Insufficient documentation

## 2018-10-21 DIAGNOSIS — Y908 Blood alcohol level of 240 mg/100 ml or more: Secondary | ICD-10-CM | POA: Diagnosis not present

## 2018-10-21 DIAGNOSIS — F10120 Alcohol abuse with intoxication, uncomplicated: Secondary | ICD-10-CM | POA: Insufficient documentation

## 2018-10-21 DIAGNOSIS — R1084 Generalized abdominal pain: Secondary | ICD-10-CM | POA: Diagnosis not present

## 2018-10-21 DIAGNOSIS — Y939 Activity, unspecified: Secondary | ICD-10-CM | POA: Diagnosis not present

## 2018-10-21 DIAGNOSIS — Y999 Unspecified external cause status: Secondary | ICD-10-CM | POA: Insufficient documentation

## 2018-10-21 DIAGNOSIS — W19XXXA Unspecified fall, initial encounter: Secondary | ICD-10-CM | POA: Diagnosis not present

## 2018-10-21 DIAGNOSIS — S0990XA Unspecified injury of head, initial encounter: Secondary | ICD-10-CM | POA: Diagnosis present

## 2018-10-21 LAB — CBC WITH DIFFERENTIAL/PLATELET
Abs Immature Granulocytes: 0.01 10*3/uL (ref 0.00–0.07)
Basophils Absolute: 0.1 10*3/uL (ref 0.0–0.1)
Basophils Relative: 1 %
Eosinophils Absolute: 0.1 10*3/uL (ref 0.0–0.5)
Eosinophils Relative: 1 %
HCT: 36.5 % — ABNORMAL LOW (ref 39.0–52.0)
Hemoglobin: 12.2 g/dL — ABNORMAL LOW (ref 13.0–17.0)
Immature Granulocytes: 0 %
Lymphocytes Relative: 45 %
Lymphs Abs: 2.4 10*3/uL (ref 0.7–4.0)
MCH: 34.7 pg — ABNORMAL HIGH (ref 26.0–34.0)
MCHC: 33.4 g/dL (ref 30.0–36.0)
MCV: 103.7 fL — ABNORMAL HIGH (ref 80.0–100.0)
Monocytes Absolute: 0.4 10*3/uL (ref 0.1–1.0)
Monocytes Relative: 8 %
Neutro Abs: 2.4 10*3/uL (ref 1.7–7.7)
Neutrophils Relative %: 45 %
Platelets: 291 10*3/uL (ref 150–400)
RBC: 3.52 MIL/uL — ABNORMAL LOW (ref 4.22–5.81)
RDW: 12.3 % (ref 11.5–15.5)
WBC: 5.3 10*3/uL (ref 4.0–10.5)
nRBC: 0 % (ref 0.0–0.2)

## 2018-10-21 LAB — COMPREHENSIVE METABOLIC PANEL
ALT: 96 U/L — ABNORMAL HIGH (ref 0–44)
AST: 235 U/L — ABNORMAL HIGH (ref 15–41)
Albumin: 3.7 g/dL (ref 3.5–5.0)
Alkaline Phosphatase: 227 U/L — ABNORMAL HIGH (ref 38–126)
Anion gap: 12 (ref 5–15)
BUN: 11 mg/dL (ref 8–23)
CO2: 22 mmol/L (ref 22–32)
Calcium: 8.2 mg/dL — ABNORMAL LOW (ref 8.9–10.3)
Chloride: 104 mmol/L (ref 98–111)
Creatinine, Ser: 0.8 mg/dL (ref 0.61–1.24)
GFR calc Af Amer: >60 mL/min (ref >60)
GFR calc non Af Amer: >60 mL/min (ref >60)
Glucose, Bld: 100 mg/dL — ABNORMAL HIGH (ref 70–99)
Potassium: 3.1 mmol/L — ABNORMAL LOW (ref 3.5–5.1)
Sodium: 138 mmol/L (ref 135–145)
Total Bilirubin: 0.8 mg/dL (ref 0.3–1.2)
Total Protein: 7.4 g/dL (ref 6.5–8.1)

## 2018-10-21 LAB — MAGNESIUM: Magnesium: 1.8 mg/dL (ref 1.7–2.4)

## 2018-10-21 LAB — LIPASE, BLOOD: Lipase: 29 U/L (ref 11–51)

## 2018-10-21 MED ORDER — POTASSIUM CHLORIDE CRYS ER 20 MEQ PO TBCR
40.0000 meq | EXTENDED_RELEASE_TABLET | Freq: Once | ORAL | Status: AC
  Administered 2018-10-21: 40 meq via ORAL
  Filled 2018-10-21: qty 2

## 2018-10-21 NOTE — ED Triage Notes (Signed)
Patient reports generalized abdominal pain with emesis today , no diarrhea or fever , +ETOH ( alcoholism) today , by standers called EMS because he is sleeping/wandering the streets . Alert [employer].

## 2018-10-21 NOTE — ED Provider Notes (Signed)
[facility] EMERGENCY DEPARTMENT Provider Note   CSN: [id number] Arrival date & time: 10/21/18  1948    History   Chief Complaint Chief Complaint  Patient presents with  . Alcohol Intoxication  . Abdominal Pain    Fatigue    HPI Chase Parsons is a 63 y.o. male with a past medical history of alcohol abuse, who presents today for evaluation after bystanders reportedly called EMS because he is sleeping and wandering on the streets.  He states that he is having generalized abdominal pain.  He tells me that this is been going on for over a year and is unchanged from when he was here the other day.  He does report that since his last ED visit he has fallen and hit his head however did not pass out.  He states that he fell because he had been drinking.  He reports that he only drinks a 40 ounce of beer once a day and that he wants to stop drinking.  He says that it is been approximately 1 year since he last attempted rehab.  He denies any nausea, vomiting, or diarrhea.  He tells me has had a headache every day for over 10 years.     HPI  Past Medical History:  Diagnosis Date  . Alcohol abuse   . Pneumonia     Patient Active Problem List   Diagnosis Date Noted  . MDD (major depressive disorder), single episode, severe , no psychosis ([place]) 01/26/2015  . Severe single current episode of major depressive disorder, without psychotic features ([place]) 01/26/2015  . Suicidal thoughts     History reviewed. No pertinent surgical history.      Home Medications    Prior to Admission medications   Medication Sig Start Date End Date Taking? Authorizing Provider  FLUoxetine (PROZAC) 10 MG capsule Take 1 capsule (10 mg total) by mouth daily. Patient not taking: Reported on 12/22/2017 01/28/15   Elmarie Shiley A, NP  potassium chloride SA (K-DUR,KLOR-CON) 20 MEQ tablet Take 1 tablet (20 mEq total) by mouth 2 (two) times daily. 6/60/63   Delora Fuel, MD  traZODone (DESYREL) 50  MG tablet Take 1 tablet (50 mg total) by mouth at bedtime. Patient not taking: Reported on 12/22/2017 01/28/15   Niel Hummer, NP    Family History No family history on file.  Social History Social History   Tobacco Use  . Smoking status: Never Smoker  . Smokeless tobacco: Never Used  Substance Use Topics  . Alcohol use: Yes    Alcohol/week: 6.0 standard drinks    Types: 6 Cans of beer per week    Comment: "All day"  . Drug use: No     Allergies   Patient has no known allergies.   Review of Systems Review of Systems  Constitutional: Negative for chills and fever.  Gastrointestinal: Positive for abdominal pain. Negative for diarrhea, nausea and vomiting.  Musculoskeletal: Negative for back pain and neck pain.  Psychiatric/Behavioral: Negative for confusion.  All other systems reviewed and are negative.    Physical Exam Updated Vital Signs BP (!) 137/99   Pulse 93   Temp 98.4 F (36.9 C) (Oral)   Resp 18   SpO2 99%   Physical Exam Vitals signs and nursing note reviewed.  Constitutional:      General: He is not in acute distress.    Appearance: He is not diaphoretic.     Comments: Disheveled  HENT:     Head:  Normocephalic and atraumatic.  Eyes:     General: No scleral icterus.       Right eye: No discharge.        Left eye: No discharge.     Conjunctiva/sclera: Conjunctivae normal.  Neck:     Musculoskeletal: Normal range of motion.  Cardiovascular:     Rate and Rhythm: Normal rate and regular rhythm.     Heart sounds: Normal heart sounds. No murmur.  Pulmonary:     Effort: Pulmonary effort is normal. No respiratory distress.     Breath sounds: No stridor.  Abdominal:     General: Abdomen is flat. Bowel sounds are normal. There is no distension.     Palpations: Abdomen is soft.     Tenderness: There is generalized abdominal tenderness. There is no guarding or rebound.  Musculoskeletal:        General: No deformity.  Skin:    General: Skin is warm  and dry.  Neurological:     Mental Status: He is alert.     Motor: No abnormal muscle tone.     Comments: Awake and alert, gait is normal.  He has slightly slowed thought process consistent with alcoholic intoxication.  No slurred speech, facial droop, or weakness in bilateral arms and legs.  Psychiatric:        Mood and Affect: Mood normal.        Behavior: Behavior normal.      ED Treatments / Results  Labs (all labs ordered are listed, but only abnormal results are displayed) Labs Reviewed  ETHANOL - Abnormal; Notable for the following components:      Result Value   Alcohol, Ethyl (B) 392 (*)    All other components within normal limits  CBC WITH DIFFERENTIAL/PLATELET - Abnormal; Notable for the following components:   RBC 3.52 (*)    Hemoglobin 12.2 (*)    HCT 36.5 (*)    MCV 103.7 (*)    MCH 34.7 (*)    All other components within normal limits  COMPREHENSIVE METABOLIC PANEL - Abnormal; Notable for the following components:   Potassium 3.1 (*)    Glucose, Bld 100 (*)    Calcium 8.2 (*)    AST 235 (*)    ALT 96 (*)    Alkaline Phosphatase 227 (*)    All other components within normal limits  LIPASE, BLOOD  MAGNESIUM  URINALYSIS, ROUTINE W REFLEX MICROSCOPIC    EKG None  Radiology Ct Head Wo Contrast  Result Date: 10/21/2018 CLINICAL DATA:  63 year old male with head trauma. EXAM: CT HEAD WITHOUT CONTRAST TECHNIQUE: Contiguous axial images were obtained from the base of the skull through the vertex without intravenous contrast. COMPARISON:  Head CT dated 10/07/2018 FINDINGS: Brain: There is mild age-related atrophy and chronic microvascular ischemic changes. There is no acute intracranial hemorrhage. No mass effect or midline shift. A 1.5 x 3.0 cm extra-axial CSF density in the left middle cranial fossa most consistent with an arachnoid cyst. This is similar to prior CT. Vascular: No hyperdense vessel or unexpected calcification. Skull: Normal. Negative for fracture  or focal lesion. Sinuses/Orbits: Right maxillary sinus retention cyst or polyp. The remainder of the visualized paranasal sinuses and mastoid air cells are clear. Other: None IMPRESSION: 1. No acute intracranial hemorrhage. 2. Mild age-related atrophy and chronic microvascular ischemic changes. 3. Stable probable left middle cranial fossa arachnoid cyst. Electronically Signed   By: Anner Crete M.D.   On: 10/21/2018 23:25    Procedures  Procedures (including critical care time)  Medications Ordered in ED Medications  potassium chloride SA (K-DUR) CR tablet 40 mEq (40 mEq Oral Given 10/21/18 2328)     Initial Impression / Assessment and Plan / ED Course  I have reviewed the triage vital signs and the nursing notes.  Pertinent labs & imaging results that were available during my care of the patient were reviewed by me and considered in my medical decision making (see chart for details).       Patient presents today for evaluation of alcohol intoxication with abdominal pain.  He was recently seen and evaluated for the same with a normal CT scan.  He tells me his abdominal pain has been present and unchanged for over 1 year therefore repeat CT scan was not obtained.  He tells me that he only drinks one 40 ounce beer a day, however his ethanol level is 392.  He is slightly hypokalemic with a potassium of 3.1 which was orally repleted.  Lipase is not elevated.  AST, ALT, and alk phos are all significantly elevated which appears to be normal for him.  He tells me that he has fallen since he was seen here last, CT scan head was obtained without evidence of acute intracranial abnormalities.  Suspect the patient may have alcoholic gastritis.  At shift change care was transferred to Quincy Carnes PA-C who will follow pending studies, re-evaulate and determine disposition.      Final Clinical Impressions(s) / ED Diagnoses   Final diagnoses:  Alcoholic intoxication without complication [facility])   Generalized abdominal pain    ED Discharge Orders    None       Ollen Gross 10/22/18 0041    Ezequiel Essex, MD 10/22/18 [phone]

## 2018-10-21 NOTE — ED Notes (Signed)
Pt went to bathroom and did not use urinal to give UA. Informed about needing to use it again next time.

## 2018-10-21 NOTE — ED Notes (Signed)
Pt states doesn't have to pee. Gave urinal for collection

## 2018-10-22 LAB — URINALYSIS, ROUTINE W REFLEX MICROSCOPIC
Bilirubin Urine: NEGATIVE
Glucose, UA: NEGATIVE mg/dL
Hgb urine dipstick: NEGATIVE
Ketones, ur: NEGATIVE mg/dL
Leukocytes,Ua: NEGATIVE
Nitrite: NEGATIVE
Protein, ur: NEGATIVE mg/dL
Specific Gravity, Urine: 1.014 (ref 1.005–1.030)
pH: 6 (ref 5.0–8.0)

## 2018-10-22 LAB — ETHANOL: Alcohol, Ethyl (B): 392 mg/dL (ref <10)

## 2018-10-22 NOTE — ED Notes (Signed)
Pt verbalized understanding of d/c instructions and has no further questions, VSS, NAD. Pt d/c home with friend. Pt encouraged to stop drinking as much.

## 2018-10-22 NOTE — ED Notes (Signed)
Pt comfortable in bed sleeping. No signs of distress noted

## 2018-10-22 NOTE — ED Provider Notes (Signed)
Assumed care from PA Hammond at shift change.  See prior note for full H&P.  Briefly, 63-year-old male here acutely intoxicated.  He also complains of abdominal pain but this is grossly been unchanged over the past year.  He does have elevation of his LFTs and alk phos but this appears to be baseline.  He had a CT scan of his abdomen on 10/15/2018 without acute findings so repeat CT scan was not obtained here.  Patient's ethanol level is 392.  CT head was obtained and is negative.  Plan: Sober and reassess.  6:43 AM Patient has been allowed to sober here overnight and has been sleeping comfortably.  His vitals are stable.  He has tolerated oral fluids without issue.  Once awoken he states he is feeling better.  He was able to get up and get dressed independently.  Feel he is stable for discharge home.  Can follow-up with PCP for any ongoing issues.  Return here for any new or acute changes.   Sanders, Lisa M, PA-C 10/22/18 0646    Rancour, Stephen, MD 10/22/18 0935  

## 2018-10-22 NOTE — Discharge Instructions (Addendum)
Follow-up with your primary care doctor. Return here for any new or acute changes.

## 2019-10-23 ENCOUNTER — Inpatient Hospital Stay (HOSPITAL_COMMUNITY)
Admission: EM | Admit: 2019-10-23 | Discharge: 2019-11-07 | DRG: 896 | Disposition: A | Discharge status: Home / Self Care | Payer: Medicaid Other | Attending: Internal Medicine | Admitting: Internal Medicine

## 2019-10-23 ENCOUNTER — Other Ambulatory Visit: Payer: Self-pay

## 2019-10-23 ENCOUNTER — Encounter (HOSPITAL_COMMUNITY): Payer: Self-pay | Admitting: Emergency Medicine

## 2019-10-23 DIAGNOSIS — F10239 Alcohol dependence with withdrawal, unspecified: Principal | ICD-10-CM | POA: Diagnosis present

## 2019-10-23 DIAGNOSIS — Z79899 Other long term (current) drug therapy: Secondary | ICD-10-CM

## 2019-10-23 DIAGNOSIS — Z590 Homelessness: Secondary | ICD-10-CM

## 2019-10-23 DIAGNOSIS — R296 Repeated falls: Secondary | ICD-10-CM | POA: Diagnosis present

## 2019-10-23 DIAGNOSIS — F329 Major depressive disorder, single episode, unspecified: Secondary | ICD-10-CM | POA: Diagnosis present

## 2019-10-23 DIAGNOSIS — R441 Visual hallucinations: Secondary | ICD-10-CM | POA: Diagnosis present

## 2019-10-23 DIAGNOSIS — F1020 Alcohol dependence, uncomplicated: Secondary | ICD-10-CM | POA: Diagnosis present

## 2019-10-23 DIAGNOSIS — I8510 Secondary esophageal varices without bleeding: Secondary | ICD-10-CM | POA: Diagnosis present

## 2019-10-23 DIAGNOSIS — E876 Hypokalemia: Secondary | ICD-10-CM | POA: Diagnosis present

## 2019-10-23 DIAGNOSIS — E860 Dehydration: Secondary | ICD-10-CM | POA: Diagnosis present

## 2019-10-23 DIAGNOSIS — R5381 Other malaise: Secondary | ICD-10-CM

## 2019-10-23 DIAGNOSIS — K766 Portal hypertension: Secondary | ICD-10-CM | POA: Diagnosis present

## 2019-10-23 DIAGNOSIS — G9340 Encephalopathy, unspecified: Secondary | ICD-10-CM | POA: Diagnosis present

## 2019-10-23 DIAGNOSIS — J189 Pneumonia, unspecified organism: Secondary | ICD-10-CM

## 2019-10-23 DIAGNOSIS — R1310 Dysphagia, unspecified: Secondary | ICD-10-CM | POA: Diagnosis present

## 2019-10-23 DIAGNOSIS — R109 Unspecified abdominal pain: Secondary | ICD-10-CM

## 2019-10-23 DIAGNOSIS — I951 Orthostatic hypotension: Secondary | ICD-10-CM | POA: Diagnosis not present

## 2019-10-23 DIAGNOSIS — D638 Anemia in other chronic diseases classified elsewhere: Secondary | ICD-10-CM | POA: Diagnosis present

## 2019-10-23 DIAGNOSIS — E872 Acidosis: Secondary | ICD-10-CM | POA: Diagnosis present

## 2019-10-23 DIAGNOSIS — Z8616 Personal history of COVID-19: Secondary | ICD-10-CM

## 2019-10-23 DIAGNOSIS — E512 Wernicke's encephalopathy: Secondary | ICD-10-CM | POA: Diagnosis present

## 2019-10-23 DIAGNOSIS — K760 Fatty (change of) liver, not elsewhere classified: Secondary | ICD-10-CM | POA: Diagnosis present

## 2019-10-23 DIAGNOSIS — K2970 Gastritis, unspecified, without bleeding: Secondary | ICD-10-CM | POA: Diagnosis present

## 2019-10-23 DIAGNOSIS — I21A1 Myocardial infarction type 2: Secondary | ICD-10-CM | POA: Diagnosis present

## 2019-10-23 DIAGNOSIS — F1010 Alcohol abuse, uncomplicated: Secondary | ICD-10-CM | POA: Diagnosis present

## 2019-10-23 DIAGNOSIS — R000 Tachycardia, unspecified: Secondary | ICD-10-CM | POA: Diagnosis present

## 2019-10-23 DIAGNOSIS — R440 Auditory hallucinations: Secondary | ICD-10-CM | POA: Diagnosis present

## 2019-10-23 DIAGNOSIS — R0602 Shortness of breath: Secondary | ICD-10-CM

## 2019-10-23 DIAGNOSIS — E871 Hypo-osmolality and hyponatremia: Secondary | ICD-10-CM | POA: Diagnosis present

## 2019-10-23 DIAGNOSIS — R509 Fever, unspecified: Secondary | ICD-10-CM | POA: Diagnosis not present

## 2019-10-23 DIAGNOSIS — E8729 Other acidosis: Secondary | ICD-10-CM

## 2019-10-23 LAB — URINALYSIS, ROUTINE W REFLEX MICROSCOPIC
Bilirubin Urine: NEGATIVE
Glucose, UA: NEGATIVE mg/dL
Ketones, ur: NEGATIVE mg/dL
Leukocytes,Ua: NEGATIVE
Nitrite: NEGATIVE
Protein, ur: 30 mg/dL — AB
Specific Gravity, Urine: 1.017 (ref 1.005–1.030)
pH: 5 (ref 5.0–8.0)

## 2019-10-23 LAB — CBC
HCT: 31.6 % — ABNORMAL LOW (ref 39.0–52.0)
Hemoglobin: 11.1 g/dL — ABNORMAL LOW (ref 13.0–17.0)
MCH: 33.2 pg (ref 26.0–34.0)
MCHC: 35.1 g/dL (ref 30.0–36.0)
MCV: 94.6 fL (ref 80.0–100.0)
Platelets: 91 10*3/uL — ABNORMAL LOW (ref 150–400)
RBC: 3.34 MIL/uL — ABNORMAL LOW (ref 4.22–5.81)
RDW: 11 % — ABNORMAL LOW (ref 11.5–15.5)
WBC: 6.6 10*3/uL (ref 4.0–10.5)
nRBC: 0 % (ref 0.0–0.2)

## 2019-10-23 LAB — BASIC METABOLIC PANEL
Anion gap: 16 — ABNORMAL HIGH (ref 5–15)
BUN: 12 mg/dL (ref 8–23)
CO2: 18 mmol/L — ABNORMAL LOW (ref 22–32)
Calcium: 8.6 mg/dL — ABNORMAL LOW (ref 8.9–10.3)
Chloride: 91 mmol/L — ABNORMAL LOW (ref 98–111)
Creatinine, Ser: 1.36 mg/dL — ABNORMAL HIGH (ref 0.61–1.24)
GFR calc Af Amer: >60 mL/min (ref >60)
GFR calc non Af Amer: 55 mL/min — ABNORMAL LOW (ref >60)
Glucose, Bld: 101 mg/dL — ABNORMAL HIGH (ref 70–99)
Potassium: 3.4 mmol/L — ABNORMAL LOW (ref 3.5–5.1)
Sodium: 125 mmol/L — ABNORMAL LOW (ref 135–145)

## 2019-10-23 MED ORDER — SODIUM CHLORIDE 0.9% FLUSH
3.0000 mL | Freq: Once | INTRAVENOUS | Status: AC
  Administered 2019-10-25: 3 mL via INTRAVENOUS

## 2019-10-23 NOTE — ED Triage Notes (Signed)
Per EMS, called for a fall in parking lot.  Pt is homeless and reports weakness and dehydrated.  He was and admits to drinking a beer and claims he usually several cans a day.  Pt was tacky at 158ml NS, heart rate went from 120 to 108bpm.   156/108 99%ra 106 CBG

## 2019-10-24 ENCOUNTER — Emergency Department (HOSPITAL_COMMUNITY): Payer: Medicaid Other

## 2019-10-24 ENCOUNTER — Encounter (HOSPITAL_COMMUNITY): Payer: Self-pay | Admitting: Student

## 2019-10-24 DIAGNOSIS — F10239 Alcohol dependence with withdrawal, unspecified: Secondary | ICD-10-CM | POA: Diagnosis present

## 2019-10-24 DIAGNOSIS — I8510 Secondary esophageal varices without bleeding: Secondary | ICD-10-CM | POA: Diagnosis present

## 2019-10-24 DIAGNOSIS — R296 Repeated falls: Secondary | ICD-10-CM | POA: Diagnosis present

## 2019-10-24 DIAGNOSIS — R5381 Other malaise: Secondary | ICD-10-CM

## 2019-10-24 DIAGNOSIS — I951 Orthostatic hypotension: Secondary | ICD-10-CM | POA: Diagnosis not present

## 2019-10-24 DIAGNOSIS — E871 Hypo-osmolality and hyponatremia: Secondary | ICD-10-CM

## 2019-10-24 DIAGNOSIS — F329 Major depressive disorder, single episode, unspecified: Secondary | ICD-10-CM | POA: Diagnosis present

## 2019-10-24 DIAGNOSIS — K760 Fatty (change of) liver, not elsewhere classified: Secondary | ICD-10-CM | POA: Diagnosis present

## 2019-10-24 DIAGNOSIS — F1020 Alcohol dependence, uncomplicated: Secondary | ICD-10-CM

## 2019-10-24 DIAGNOSIS — R509 Fever, unspecified: Secondary | ICD-10-CM | POA: Diagnosis not present

## 2019-10-24 DIAGNOSIS — I21A1 Myocardial infarction type 2: Secondary | ICD-10-CM | POA: Diagnosis present

## 2019-10-24 DIAGNOSIS — Z8616 Personal history of COVID-19: Secondary | ICD-10-CM | POA: Diagnosis not present

## 2019-10-24 DIAGNOSIS — D638 Anemia in other chronic diseases classified elsewhere: Secondary | ICD-10-CM | POA: Diagnosis present

## 2019-10-24 DIAGNOSIS — R440 Auditory hallucinations: Secondary | ICD-10-CM | POA: Diagnosis present

## 2019-10-24 DIAGNOSIS — E872 Acidosis: Secondary | ICD-10-CM | POA: Diagnosis present

## 2019-10-24 DIAGNOSIS — E860 Dehydration: Secondary | ICD-10-CM | POA: Diagnosis present

## 2019-10-24 DIAGNOSIS — K766 Portal hypertension: Secondary | ICD-10-CM | POA: Diagnosis present

## 2019-10-24 DIAGNOSIS — F1010 Alcohol abuse, uncomplicated: Secondary | ICD-10-CM | POA: Diagnosis present

## 2019-10-24 DIAGNOSIS — R441 Visual hallucinations: Secondary | ICD-10-CM | POA: Diagnosis present

## 2019-10-24 DIAGNOSIS — E512 Wernicke's encephalopathy: Secondary | ICD-10-CM | POA: Diagnosis present

## 2019-10-24 HISTORY — DX: Alcohol dependence, uncomplicated: F10.20

## 2019-10-24 HISTORY — DX: Hypo-osmolality and hyponatremia: E87.1

## 2019-10-24 LAB — RETICULOCYTES
Immature Retic Fract: 7 % (ref 2.3–15.9)
RBC.: 3.27 MIL/uL — ABNORMAL LOW (ref 4.22–5.81)
Retic Count, Absolute: 42.8 10*3/uL (ref 19.0–186.0)
Retic Ct Pct: 1.3 % (ref 0.4–3.1)

## 2019-10-24 LAB — CK: Total CK: 745 U/L — ABNORMAL HIGH (ref 49–397)

## 2019-10-24 LAB — COMPREHENSIVE METABOLIC PANEL
ALT: 37 U/L (ref 0–44)
AST: 66 U/L — ABNORMAL HIGH (ref 15–41)
Albumin: 3.3 g/dL — ABNORMAL LOW (ref 3.5–5.0)
Alkaline Phosphatase: 59 U/L (ref 38–126)
Anion gap: 13 (ref 5–15)
BUN: 10 mg/dL (ref 8–23)
CO2: 20 mmol/L — ABNORMAL LOW (ref 22–32)
Calcium: 7.8 mg/dL — ABNORMAL LOW (ref 8.9–10.3)
Chloride: 97 mmol/L — ABNORMAL LOW (ref 98–111)
Creatinine, Ser: 0.87 mg/dL (ref 0.61–1.24)
GFR calc Af Amer: >60 mL/min (ref >60)
GFR calc non Af Amer: >60 mL/min (ref >60)
Glucose, Bld: 92 mg/dL (ref 70–99)
Potassium: 3.9 mmol/L (ref 3.5–5.1)
Sodium: 130 mmol/L — ABNORMAL LOW (ref 135–145)
Total Bilirubin: 1.8 mg/dL — ABNORMAL HIGH (ref 0.3–1.2)
Total Protein: 7.7 g/dL (ref 6.5–8.1)

## 2019-10-24 LAB — PROTIME-INR
INR: 1.3 — ABNORMAL HIGH (ref 0.8–1.2)
Prothrombin Time: 15.3 seconds — ABNORMAL HIGH (ref 11.4–15.2)

## 2019-10-24 LAB — POC OCCULT BLOOD, ED: Fecal Occult Bld: NEGATIVE

## 2019-10-24 LAB — ETHANOL: Alcohol, Ethyl (B): <10 mg/dL (ref <10)

## 2019-10-24 LAB — FERRITIN: Ferritin: 1620 ng/mL — ABNORMAL HIGH (ref 24–336)

## 2019-10-24 LAB — IRON AND TIBC
Iron: 42 ug/dL — ABNORMAL LOW (ref 45–182)
Saturation Ratios: 18 % (ref 17.9–39.5)
TIBC: 237 ug/dL — ABNORMAL LOW (ref 250–450)
UIBC: 195 ug/dL

## 2019-10-24 LAB — HEPATIC FUNCTION PANEL
ALT: 44 U/L (ref 0–44)
AST: 79 U/L — ABNORMAL HIGH (ref 15–41)
Albumin: 3.7 g/dL (ref 3.5–5.0)
Alkaline Phosphatase: 65 U/L (ref 38–126)
Bilirubin, Direct: 0.7 mg/dL — ABNORMAL HIGH (ref 0.0–0.2)
Indirect Bilirubin: 1.4 mg/dL — ABNORMAL HIGH (ref 0.3–0.9)
Total Bilirubin: 2.1 mg/dL — ABNORMAL HIGH (ref 0.3–1.2)
Total Protein: 8.4 g/dL — ABNORMAL HIGH (ref 6.5–8.1)

## 2019-10-24 LAB — PHOSPHORUS: Phosphorus: 1.9 mg/dL — ABNORMAL LOW (ref 2.5–4.6)

## 2019-10-24 LAB — LIPASE, BLOOD: Lipase: 41 U/L (ref 11–51)

## 2019-10-24 LAB — HIV ANTIBODY (ROUTINE TESTING W REFLEX): HIV Screen 4th Generation wRfx: NONREACTIVE

## 2019-10-24 LAB — MAGNESIUM: Magnesium: 0.9 mg/dL — CL (ref 1.7–2.4)

## 2019-10-24 LAB — FOLATE: Folate: 15.3 ng/mL (ref >5.9)

## 2019-10-24 LAB — SARS CORONAVIRUS 2 BY RT PCR (HOSPITAL ORDER, PERFORMED IN ~~LOC~~ HOSPITAL LAB): SARS Coronavirus 2: NEGATIVE

## 2019-10-24 LAB — VITAMIN B12: Vitamin B-12: 261 pg/mL (ref 180–914)

## 2019-10-24 LAB — LACTIC ACID, PLASMA: Lactic Acid, Venous: 1.3 mmol/L (ref 0.5–1.9)

## 2019-10-24 MED ORDER — SODIUM PHOSPHATES 45 MMOLE/15ML IV SOLN
30.0000 mmol | Freq: Once | INTRAVENOUS | Status: AC
  Administered 2019-10-24: 30 mmol via INTRAVENOUS
  Filled 2019-10-24: qty 10

## 2019-10-24 MED ORDER — POTASSIUM CHLORIDE CRYS ER 20 MEQ PO TBCR
40.0000 meq | EXTENDED_RELEASE_TABLET | Freq: Once | ORAL | Status: AC
  Administered 2019-10-24: 40 meq via ORAL
  Filled 2019-10-24: qty 2

## 2019-10-24 MED ORDER — THIAMINE HCL 100 MG/ML IJ SOLN: 100.0000 mg | Freq: Every day | INTRAMUSCULAR | Status: DC

## 2019-10-24 MED ORDER — MAGNESIUM SULFATE 4 GM/100ML IV SOLN
4.0000 g | Freq: Once | INTRAVENOUS | Status: AC
  Administered 2019-10-24: 4 g via INTRAVENOUS
  Filled 2019-10-24: qty 100

## 2019-10-24 MED ORDER — LORAZEPAM 1 MG PO TABS: 1.0000 mg | ORAL_TABLET | ORAL | Status: DC | PRN

## 2019-10-24 MED ORDER — LACTATED RINGERS IV SOLN: INTRAVENOUS | Status: DC

## 2019-10-24 MED ORDER — LORAZEPAM 2 MG/ML IJ SOLN
1.0000 mg | INTRAMUSCULAR | Status: DC | PRN
  Administered 2019-10-24 (×3): 1 mg via INTRAVENOUS
  Administered 2019-10-25: 4 mg via INTRAVENOUS
  Administered 2019-10-25: 2 mg via INTRAVENOUS
  Administered 2019-10-25: 1 mg via INTRAVENOUS
  Administered 2019-10-25 (×2): 2 mg via INTRAVENOUS
  Administered 2019-10-25: 4 mg via INTRAVENOUS
  Administered 2019-10-25 – 2019-10-26 (×2): 2 mg via INTRAVENOUS
  Administered 2019-10-26: 1 mg via INTRAVENOUS
  Administered 2019-10-26: 2 mg via INTRAVENOUS
  Administered 2019-10-26 (×3): 1 mg via INTRAVENOUS
  Administered 2019-10-26 – 2019-10-27 (×4): 2 mg via INTRAVENOUS
  Filled 2019-10-24 (×9): qty 1
  Filled 2019-10-24: qty 2
  Filled 2019-10-24 (×2): qty 1
  Filled 2019-10-24: qty 2
  Filled 2019-10-24 (×8): qty 1

## 2019-10-24 MED ORDER — PANTOPRAZOLE SODIUM 40 MG PO TBEC
40.0000 mg | DELAYED_RELEASE_TABLET | Freq: Every day | ORAL | Status: DC
  Administered 2019-10-25 – 2019-11-07 (×13): 40 mg via ORAL
  Filled 2019-10-24 (×13): qty 1

## 2019-10-24 MED ORDER — THIAMINE HCL 100 MG PO TABS
100.0000 mg | ORAL_TABLET | Freq: Every day | ORAL | Status: DC
  Administered 2019-10-24: 100 mg via ORAL
  Filled 2019-10-24: qty 1

## 2019-10-24 MED ORDER — KCL IN DEXTROSE-NACL 20-5-0.45 MEQ/L-%-% IV SOLN
INTRAVENOUS | Status: DC
  Filled 2019-10-24: qty 1000

## 2019-10-24 MED ORDER — LACTATED RINGERS IV BOLUS: 500.0000 mL | Freq: Once | INTRAVENOUS | Status: DC

## 2019-10-24 MED ORDER — ACETAMINOPHEN 325 MG PO TABS
650.0000 mg | ORAL_TABLET | Freq: Four times a day (QID) | ORAL | Status: DC | PRN
  Administered 2019-10-24 – 2019-10-29 (×6): 650 mg via ORAL
  Filled 2019-10-24 (×6): qty 2

## 2019-10-24 MED ORDER — SODIUM CHLORIDE 0.9 % IV BOLUS
1000.0000 mL | Freq: Once | INTRAVENOUS | Status: AC
  Administered 2019-10-24: 1000 mL via INTRAVENOUS

## 2019-10-24 MED ORDER — CHLORDIAZEPOXIDE HCL 25 MG PO CAPS: 25.0000 mg | ORAL_CAPSULE | Freq: Three times a day (TID) | ORAL | Status: DC

## 2019-10-24 MED ORDER — LORAZEPAM 2 MG/ML IJ SOLN: 0.0000 mg | Freq: Four times a day (QID) | INTRAMUSCULAR | Status: DC

## 2019-10-24 MED ORDER — LORAZEPAM 2 MG/ML IJ SOLN
1.0000 mg | INTRAMUSCULAR | Status: DC | PRN
Start: 2019-10-24 — End: 2019-10-24

## 2019-10-24 MED ORDER — K PHOS MONO-SOD PHOS DI & MONO 155-852-130 MG PO TABS: 500.0000 mg | ORAL_TABLET | Freq: Once | ORAL | Status: DC

## 2019-10-24 MED ORDER — CHLORDIAZEPOXIDE HCL 25 MG PO CAPS
50.0000 mg | ORAL_CAPSULE | Freq: Once | ORAL | Status: AC
  Administered 2019-10-24: 50 mg via ORAL
  Filled 2019-10-24: qty 2

## 2019-10-24 MED ORDER — ADULT MULTIVITAMIN W/MINERALS CH
1.0000 | ORAL_TABLET | Freq: Every day | ORAL | Status: DC
  Administered 2019-10-24 – 2019-11-07 (×14): 1 via ORAL
  Filled 2019-10-24 (×14): qty 1

## 2019-10-24 MED ORDER — THIAMINE HCL 100 MG PO TABS
100.0000 mg | ORAL_TABLET | Freq: Every day | ORAL | Status: DC
  Administered 2019-10-25 – 2019-10-29 (×4): 100 mg via ORAL
  Filled 2019-10-24 (×4): qty 1

## 2019-10-24 MED ORDER — ACETAMINOPHEN 650 MG RE SUPP
650.0000 mg | Freq: Four times a day (QID) | RECTAL | Status: DC | PRN
  Administered 2019-10-25: 650 mg via RECTAL
  Filled 2019-10-24 (×2): qty 1

## 2019-10-24 MED ORDER — ACETAMINOPHEN 325 MG PO TABS
650.0000 mg | ORAL_TABLET | Freq: Once | ORAL | Status: AC | PRN
  Administered 2019-10-24: 650 mg via ORAL
  Filled 2019-10-24: qty 2

## 2019-10-24 MED ORDER — SENNA 8.6 MG PO TABS
1.0000 | ORAL_TABLET | Freq: Two times a day (BID) | ORAL | Status: DC
  Administered 2019-10-25 – 2019-11-07 (×22): 8.6 mg via ORAL
  Filled 2019-10-24 (×24): qty 1

## 2019-10-24 MED ORDER — FOLIC ACID 1 MG PO TABS
1.0000 mg | ORAL_TABLET | Freq: Every day | ORAL | Status: DC
  Administered 2019-10-24 – 2019-11-07 (×14): 1 mg via ORAL
  Filled 2019-10-24 (×14): qty 1

## 2019-10-24 MED ORDER — CHLORDIAZEPOXIDE HCL 25 MG PO CAPS
25.0000 mg | ORAL_CAPSULE | Freq: Three times a day (TID) | ORAL | Status: AC
  Administered 2019-10-25 (×2): 25 mg via ORAL
  Filled 2019-10-24 (×3): qty 1

## 2019-10-24 MED ORDER — IOHEXOL 300 MG/ML  SOLN
100.0000 mL | Freq: Once | INTRAMUSCULAR | Status: AC | PRN
  Administered 2019-10-24: 100 mL via INTRAVENOUS

## 2019-10-24 MED ORDER — LORAZEPAM 1 MG PO TABS: 0.0000 mg | ORAL_TABLET | Freq: Two times a day (BID) | ORAL | Status: DC

## 2019-10-24 MED ORDER — LORAZEPAM 2 MG/ML IJ SOLN: 0.0000 mg | Freq: Two times a day (BID) | INTRAMUSCULAR | Status: DC

## 2019-10-24 MED ORDER — DEXTROSE IN LACTATED RINGERS 5 % IV SOLN: INTRAVENOUS | Status: AC

## 2019-10-24 MED ORDER — LORAZEPAM 1 MG PO TABS
0.0000 mg | ORAL_TABLET | Freq: Four times a day (QID) | ORAL | Status: DC
  Administered 2019-10-24: 1 mg via ORAL
  Filled 2019-10-24: qty 1

## 2019-10-24 MED ORDER — THIAMINE HCL 100 MG/ML IJ SOLN
100.0000 mg | Freq: Every day | INTRAMUSCULAR | Status: DC
  Administered 2019-10-27: 100 mg via INTRAVENOUS
  Filled 2019-10-24: qty 2

## 2019-10-24 NOTE — ED Notes (Signed)
Pt had 3 urine occurrences and unable to measure due to patient going in the bed or dropping urinal

## 2019-10-24 NOTE — ED Notes (Signed)
Paged MD about pt's elevated HR. Increased to the 120s-130s. Pt keeps pulling himself to try to sit up in the bed to get up and "get his chicken". Informed pt to lay back and try to relax because he keeps making his HR go up when he pulls up to get up in the bed. Pt not listening to redirection. Given another 1mg  of Ativan

## 2019-10-24 NOTE — ED Provider Notes (Signed)
[facility] EMER[facility] DEPARTMENT Provider Note   CSN: [id number] Arrival date & time: 10/23/19  2025     History Chief Complaint  Patient presents with  . Fall  . Weakness    Chase Parsons is a 64 y.o. male with a history homelessness, EtOH abuse, and depression who presents to the ED with complaints of generalized weakness. Patient states he has had balance issues, gait difficulty, & frequent falls related to balance issues, gets lightheaded at times, does not recall a specific syncopal event. States he falls almost daily, he fell most recently yesterday and hurt his left knee, no other injury complaints, denies head injury or LOC. He states he feels generally weak, has not eaten much in about 1 week, reports daily vomiting episodes for "awhile now", and has had congestion, dry cough, and subjective fever over the past few days. However, patient does state he has had on and off sweats/chills including night sweats for awhile now as well as some weight loss he is having difficulty quantifying. Last BM yesterday, had to strain. Denies ear pain, sore throat, headache, neck stiffness, dyspnea, chest pain, hematemesis, melena, dysuria, testicular pain/swelling, rectal pain, rashes, or recent tick bites. Denies IVDU. Denies recent surgical procedures.   HPI     Past Medical History:  Diagnosis Date  . Alcohol abuse   . Pneumonia     Patient Active Problem List   Diagnosis Date Noted  . MDD (major depressive disorder), single episode, severe , no psychosis ([place]) 01/26/2015  . Severe single current episode of major depressive disorder, without psychotic features ([place]) 01/26/2015  . Suicidal thoughts     History reviewed. No pertinent surgical history.     History reviewed. No pertinent family history.  Social History   Tobacco Use  . Smoking status: Never Smoker  . Smokeless tobacco: Never Used  Vaping Use  . Vaping Use: Never used  Substance Use Topics    . Alcohol use: Yes    Alcohol/week: 6.0 standard drinks    Types: 6 Cans of beer per week    Comment: "All day"  . Drug use: No    Home Medications Prior to Admission medications   Medication Sig Start Date End Date Taking? Authorizing Provider  FLUoxetine (PROZAC) 10 MG capsule Take 1 capsule (10 mg total) by mouth daily. Patient not taking: Reported on 12/22/2017 01/28/15   Elmarie Shiley A, NP  potassium chloride SA (K-DUR,KLOR-CON) 20 MEQ tablet Take 1 tablet (20 mEq total) by mouth 2 (two) times daily. 4/74/25   Delora Fuel, MD  traZODone (DESYREL) 50 MG tablet Take 1 tablet (50 mg total) by mouth at bedtime. Patient not taking: Reported on 12/22/2017 01/28/15   Niel Hummer, NP    Allergies    Patient has no known allergies.  Review of Systems   Review of Systems  Constitutional: Positive for chills, fever and unexpected weight change.  HENT: Positive for congestion. Negative for ear pain and sore throat.   Respiratory: Positive for cough. Negative for shortness of breath.   Cardiovascular: Negative for chest pain.  Gastrointestinal: Positive for abdominal pain, constipation, nausea and vomiting. Negative for blood in stool.  Genitourinary: Negative for dysuria.  Musculoskeletal: Positive for arthralgias and gait problem. Negative for back pain and neck pain.  Neurological: Positive for light-headedness. Negative for dizziness, seizures and syncope.  All other systems reviewed and are negative.   Physical Exam Updated Vital Signs BP (!) 155/97 (BP Location: Right Arm)  Pulse (!) 101   Temp 98.3 F (36.8 C) (Oral)   Resp 17   Ht 5\' 9"  (1.753 m)   Wt 61.2 kg   SpO2 99%   BMI 19.94 kg/m   Physical Exam Vitals and nursing note reviewed.  Constitutional:      General: He is not in acute distress.    Appearance: He is well-developed. He is not toxic-appearing.  HENT:     Head: Normocephalic and atraumatic.     Right Ear: Ear canal normal. Tympanic membrane is not  perforated, erythematous, retracted or bulging.     Left Ear: Ear canal normal. Tympanic membrane is not perforated, erythematous, retracted or bulging.     Ears:     Comments: No mastoid erythema/swellng/tenderness.     Nose:     Right Sinus: No maxillary sinus tenderness or frontal sinus tenderness.     Left Sinus: No maxillary sinus tenderness or frontal sinus tenderness.     Mouth/Throat:     Pharynx: Oropharynx is clear. Uvula midline. No oropharyngeal exudate or posterior oropharyngeal erythema.     Comments: Posterior oropharynx is symmetric appearing. Patient tolerating own secretions without difficulty. No trismus. No drooling. No hot potato voice. No swelling beneath the tongue, submandibular compartment is soft.  Eyes:     General:        Right eye: No discharge.        Left eye: No discharge.     Extraocular Movements: Extraocular movements intact.     Conjunctiva/sclera: Conjunctivae normal.     Pupils: Pupils are equal, round, and reactive to light.  Cardiovascular:     Rate and Rhythm: Normal rate and regular rhythm.     Heart sounds: No murmur heard.   Pulmonary:     Effort: Pulmonary effort is normal. No respiratory distress.     Breath sounds: Normal breath sounds. No wheezing, rhonchi or rales.  Chest:     Chest wall: No tenderness.  Abdominal:     General: There is no distension.     Palpations: Abdomen is soft.     Tenderness: There is abdominal tenderness (generalized). There is no right CVA tenderness, left CVA tenderness, guarding or rebound.  Musculoskeletal:     Cervical back: Neck supple. No rigidity.     Right lower leg: No edema.     Left lower leg: No edema.     Comments: Upper extremities: Intact active range of motion no focal bony tenderness Back: No midline tenderness Lower extremities: Intact active range of motion, tenderness over the left anterior knee.  No signs of infection.  Lymphadenopathy:     Cervical: No cervical adenopathy.  Skin:     General: Skin is warm and dry.     Findings: No rash.  Neurological:     Mental Status: He is alert.     Comments: Alert.  Clear speech.  CN II through XII grossly intact.  Sensation gross intact bilateral upper and lower extremities.  5-5 symmetric grip strength.  5-5 strength with plantar dorsiflexion bilaterally.  Patient is a bit unsteady with ambulation.  Psychiatric:        Behavior: Behavior normal.     ED Results / Procedures / Treatments   Labs (all labs ordered are listed, but only abnormal results are displayed) Labs Reviewed  BASIC METABOLIC PANEL - Abnormal; Notable for the following components:      Result Value   Sodium 125 (*)    Potassium 3.4 (*)  Chloride 91 (*)    CO2 18 (*)    Glucose, Bld 101 (*)    Creatinine, Ser 1.36 (*)    Calcium 8.6 (*)    GFR calc non Af Amer 55 (*)    Anion gap 16 (*)    All other components within normal limits  CBC - Abnormal; Notable for the following components:   RBC 3.34 (*)    Hemoglobin 11.1 (*)    HCT 31.6 (*)    RDW 11.0 (*)    Platelets 91 (*)    All other components within normal limits  URINALYSIS, ROUTINE W REFLEX MICROSCOPIC - Abnormal; Notable for the following components:   Color, Urine AMBER (*)    APPearance HAZY (*)    Hgb urine dipstick SMALL (*)    Protein, ur 30 (*)    Bacteria, UA RARE (*)    All other components within normal limits  HEPATIC FUNCTION PANEL - Abnormal; Notable for the following components:   Total Protein 8.4 (*)    AST 79 (*)    Total Bilirubin 2.1 (*)    Bilirubin, Direct 0.7 (*)    Indirect Bilirubin 1.4 (*)    All other components within normal limits  SARS CORONAVIRUS 2 BY RT PCR (HOSPITAL ORDER, [facility])  CULTURE, BLOOD (ROUTINE X 2)  CULTURE, BLOOD (ROUTINE X 2)  ETHANOL  LIPASE, BLOOD  HIV ANTIBODY (ROUTINE TESTING W REFLEX)  CBG MONITORING, ED    EKG EKG Interpretation  Date/Time:  Monday October 23 2019 20:36:47 EDT Ventricular  Rate:  119 PR Interval:  148 QRS Duration: 76 QT Interval:  340 QTC Calculation: 478 R Axis:   62 Text Interpretation: Sinus tachycardia Nonspecific ST abnormality Abnormal ECG Confirmed by Thayer Jew [phone]) on 10/24/2019 6:32:38 AM   Radiology CT Head Wo Contrast  Result Date: 10/24/2019 CLINICAL DATA:  Right frontal headache. EXAM: CT HEAD WITHOUT CONTRAST TECHNIQUE: Contiguous axial images were obtained from the base of the skull through the vertex without intravenous contrast. COMPARISON:  October 21, 2018. FINDINGS: Brain: Mild diffuse cortical atrophy is noted. Mild chronic ischemic white matter disease is noted. No mass effect or midline shift is noted. Ventricular size is within normal limits. There is no evidence of mass lesion, hemorrhage or acute infarction. Vascular: No hyperdense vessel or unexpected calcification. Skull: Normal. Negative for fracture or focal lesion. Sinuses/Orbits: No acute finding. Other: None. IMPRESSION: Mild diffuse cortical atrophy. Mild chronic ischemic white matter disease. No acute intracranial abnormality seen. Electronically Signed   By: Marijo Conception M.D.   On: 10/24/2019 10:45   CT Abdomen Pelvis W Contrast  Result Date: 10/24/2019 CLINICAL DATA:  Abdominal pain. EXAM: CT ABDOMEN AND PELVIS WITH CONTRAST TECHNIQUE: Multidetector CT imaging of the abdomen and pelvis was performed using the standard protocol following bolus administration of intravenous contrast. CONTRAST:  134mL OMNIPAQUE IOHEXOL 300 MG/ML  SOLN COMPARISON:  CT scan 10/15/2018 FINDINGS: Lower chest: The lung bases are clear of acute process. No pleural effusion or pulmonary lesions. The heart is normal in size. No pericardial effusion. Coronary artery calcifications are noted. The distal esophagus and aorta are unremarkable. Hepatobiliary: Diffuse fatty infiltration of the liver but no focal hepatic lesions or intrahepatic biliary dilatation. The liver contour is slightly irregular and  suspect portal venous hypertension, portal venous collaterals and early esophageal varices. Findings suggest cirrhosis. The portal and hepatic veins are patent. The gallbladder is unremarkable. No common bile duct dilatation. Pancreas:  No mass, inflammation or ductal dilatation. Spleen: Normal size. No focal lesions. Adrenals/Urinary Tract: Adrenal glands are unremarkable. Bilateral renal calculi but no obstructing ureteral calculi or bladder calculi. No worrisome renal lesions are identified. No collecting system abnormalities other than the renal calculi demonstrated. Stomach/Bowel: The stomach, duodenum, small bowel and colon are unremarkable. No acute inflammatory changes, mass lesions or obstructive findings. The terminal ileum is normal. The appendix is normal. Moderate descending colon and sigmoid colon diverticulosis but no findings for acute diverticulitis. Vascular/Lymphatic: Scattered atherosclerotic calcifications involving the aorta and iliac arteries but no aneurysm or dissection. The branch vessels are patent. The major venous structures are patent. No mesenteric or retroperitoneal mass or adenopathy. Small scattered lymph nodes are noted. Reproductive: The prostate gland and seminal vesicles are unremarkable. Other: No pelvic mass or adenopathy. No free pelvic fluid collections. No inguinal mass or adenopathy. No abdominal wall hernia or subcutaneous lesions. Musculoskeletal: No significant bony findings. IMPRESSION: 1. No acute abdominal/pelvic findings, mass lesions or adenopathy. 2. 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 3. Bilateral renal calculi but no obstructing ureteral calculi or bladder calculi. Aortic Atherosclerosis (ICD10-I70.0). Electronically Signed   By: Marijo Sanes M.D.   On: 10/24/2019 10:52   DG Chest Portable 1 View  Result  Date: 10/24/2019 CLINICAL DATA:  64 year old male with history of fever. History of trauma from a fall in the parking lot. EXAM: PORTABLE CHEST 1 VIEW COMPARISON:  Chest x-ray 10/07/2018. FINDINGS: Lung volumes are normal. No consolidative airspace disease. No pleural effusions. No pneumothorax. No pulmonary nodule or mass noted. Pulmonary vasculature and the cardiomediastinal silhouette are within normal limits. Aortic atherosclerosis. IMPRESSION: 1.  No radiographic evidence of acute cardiopulmonary disease. 2. Aortic atherosclerosis. Electronically Signed   By: Vinnie Langton M.D.   On: 10/24/2019 08:01   DG Knee Complete 4 Views Left  Result Date: 10/24/2019 CLINICAL DATA:  Trauma secondary to a fall. EXAM: LEFT KNEE - COMPLETE 4+ VIEW COMPARISON:  None. FINDINGS: No evidence of fracture, dislocation, or joint effusion. No evidence of arthropathy or other focal bone abnormality. Soft tissues are unremarkable. IMPRESSION: Negative. Electronically Signed   By: Lorriane Shire M.D.   On: 10/24/2019 08:01    Procedures Procedures (including critical care time)  Medications Ordered in ED Medications  sodium chloride flush (NS) 0.9 % injection 3 mL (has no administration in time range)  acetaminophen (TYLENOL) tablet 650 mg (650 mg Oral Given 10/24/19 0225)    ED Course  I have reviewed the triage vital signs and the nursing notes.  Pertinent labs & imaging results that were available during my care of the patient were reviewed by me and considered in my medical decision making (see chart for details).    Chase Parsons was evaluated in Emergency Department on 10/24/2019 for the symptoms described in the history of present illness. He/she was evaluated in the context of the global COVID-19 pandemic, which necessitated consideration that the patient might be at risk for infection with the SARS-CoV-2 virus that causes COVID-19. Institutional protocols and algorithms that pertain to the evaluation  of patients at risk for COVID-19 are in a state of rapid change based on information released by regulatory bodies including the CDC and federal and state organizations. These policies and algorithms were followed during the patient's care in the ED.  MDM Rules/Calculators/A&P  Patient presents to the ED with complaints of fall & generalized weakness.  On arrival noted to be febrile, tachycardic, hypertensive.  Vitals improved on my assessment.   Additional history obtained:  Additional history obtained from review of chart & nursing notes. Previous records obtained and reviewed.   Lab Tests:  I Ordered, reviewed, and interpreted labs, which included:  CBC: Anemia fairly similar to prior. Thrombocytopenia which is new compared to prior, did have one platelet count of 131 but otherwise has been in the 200s previously. No leukocytosis/leukopenia.  BMP: Mild elevated anion gap acidosis with mild AKI (creatinine 1.36 today previously 0.80). New hyponatremia @ 125 decreased from prior 138, also mildly hypokalemic/chloremic/calcemic.  UA: Small hgb present. No UTI.  Hepatic function panel: mildly elevated AST & T bili. Somewhat similar   Imaging Studies ordered:  I ordered imaging studies which included CT head, CT A/P, CXR, & L knee x-ray, I independently visualized and interpreted imaging which showed:  CT head: Mild diffuse cortical atrophy. Mild chronic ischemic white matter disease. No acute intracranial abnormality seen CT abdomen/pelvis: . No acute abdominal/pelvic findings, mass lesions or adenopathy. 2. 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 3. Bilateral renal calculi but no obstructing ureteral calculi or bladder calculi. Aortic Atherosclerosis L knee x-ray: Negative CXR:  1.  No radiographic evidence of acute  cardiopulmonary disease. 2. Aortic atherosclerosis.  ED Course:  Patient's work-up is notable for dehydration given his electrolyte abnormalities and mildly elevated renal function with orthostatic hypotension. Orthostatic VS for the past 24 hrs:  BP- Lying Pulse- Lying BP- Sitting Pulse- Sitting  10/24/19 0911 (!) 163/91 98 152/89 103    In regards to his fever, unclear source at this time, HEENT exam without signs of infection, no nuchal rigidity to indicate meningitis, CXR without infiltrate to suggest pneumonia, COVID testing negative, CT A/P without intra-abdominal source of infection, no evidence of cellulitis, no hx of IVDU or murmur to raise concern for endocarditis- blood cultures pending, UA without UTI, No tick exposures or rashes to raise concern for [facility] spotted fever, HIV testing negative.  Regarding his metabolic derangements suspect these are secondary to dehydration and his frequent EtOH use, He was given 1.5L of NS in the ED, however his BP seems to be declining, currently 41Y systolic, patient still does not feel very well. Will consult for admission at this time for continued orthostatic hypotension, metabolic derangements, and fever of unknown origin.   Orthostatic VS for the past 24 hrs:  BP- Lying Pulse- Lying BP- Sitting Pulse- Sitting BP- Standing at 0 minutes Pulse- Standing at 0 minutes  10/24/19 1437 (!) 132/100 102 127/67 112 94/63 115  10/24/19 0911 (!) 163/91 98 152/89 103 -- --   15:00: CONSULT: Discussed with internal medicine residency service, Dr. Sharon Seller, accepts admission.   Portions of this note were generated with [employer]. Dictation errors may occur despite best attempts at proofreading.   Final Clinical Impression(s) / ED Diagnoses Final diagnoses:  Fever, unspecified fever cause  Dehydration  High anion gap metabolic acidosis  Hyponatremia    Rx / DC Orders ED Discharge Orders    None       Amaryllis Dyke,  PA-C 10/24/19 1505    Veryl Speak, MD 10/24/19 1531

## 2019-10-24 NOTE — ED Notes (Addendum)
Pt still trying to pull up in the bed, making HR go to 150s. HR is 120 when pt lies still, but he keeps doing crunching motions trying to get up out of the bed. CIWA reassessed and MD paged informed him of probable Ativan administration again

## 2019-10-24 NOTE — ED Notes (Signed)
MD paged about pt's low Mg
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
## 2019-10-24 NOTE — ED Notes (Signed)
Pt tachy @ 114. Given Ativan for elevated CIWA score. Pt continuously attempting to get up out of the bed

## 2019-10-25 LAB — CBC WITH DIFFERENTIAL/PLATELET
Abs Immature Granulocytes: 0.03 10*3/uL (ref 0.00–0.07)
Basophils Absolute: 0 10*3/uL (ref 0.0–0.1)
Basophils Relative: 1 %
Eosinophils Absolute: 0 10*3/uL (ref 0.0–0.5)
Eosinophils Relative: 0 %
HCT: 31.8 % — ABNORMAL LOW (ref 39.0–52.0)
Hemoglobin: 11.2 g/dL — ABNORMAL LOW (ref 13.0–17.0)
Immature Granulocytes: 0 %
Lymphocytes Relative: 15 %
Lymphs Abs: 1.3 10*3/uL (ref 0.7–4.0)
MCH: 33.5 pg (ref 26.0–34.0)
MCHC: 35.2 g/dL (ref 30.0–36.0)
MCV: 95.2 fL (ref 80.0–100.0)
Monocytes Absolute: 1.1 10*3/uL — ABNORMAL HIGH (ref 0.1–1.0)
Monocytes Relative: 13 %
Neutro Abs: 5.9 10*3/uL (ref 1.7–7.7)
Neutrophils Relative %: 71 %
Platelets: 105 10*3/uL — ABNORMAL LOW (ref 150–400)
RBC: 3.34 MIL/uL — ABNORMAL LOW (ref 4.22–5.81)
RDW: 11.1 % — ABNORMAL LOW (ref 11.5–15.5)
WBC: 8.4 10*3/uL (ref 4.0–10.5)
nRBC: 0 % (ref 0.0–0.2)

## 2019-10-25 LAB — COMPREHENSIVE METABOLIC PANEL
ALT: 33 U/L (ref 0–44)
AST: 60 U/L — ABNORMAL HIGH (ref 15–41)
Albumin: 3 g/dL — ABNORMAL LOW (ref 3.5–5.0)
Alkaline Phosphatase: 55 U/L (ref 38–126)
Anion gap: 11 (ref 5–15)
BUN: 5 mg/dL — ABNORMAL LOW (ref 8–23)
CO2: 23 mmol/L (ref 22–32)
Calcium: 7.8 mg/dL — ABNORMAL LOW (ref 8.9–10.3)
Chloride: 96 mmol/L — ABNORMAL LOW (ref 98–111)
Creatinine, Ser: 1.01 mg/dL (ref 0.61–1.24)
GFR calc Af Amer: >60 mL/min (ref >60)
GFR calc non Af Amer: >60 mL/min (ref >60)
Glucose, Bld: 114 mg/dL — ABNORMAL HIGH (ref 70–99)
Potassium: 3 mmol/L — ABNORMAL LOW (ref 3.5–5.1)
Sodium: 130 mmol/L — ABNORMAL LOW (ref 135–145)
Total Bilirubin: 1.9 mg/dL — ABNORMAL HIGH (ref 0.3–1.2)
Total Protein: 7.1 g/dL (ref 6.5–8.1)

## 2019-10-25 LAB — CBC
HCT: 32 % — ABNORMAL LOW (ref 39.0–52.0)
Hemoglobin: 10.9 g/dL — ABNORMAL LOW (ref 13.0–17.0)
MCH: 32.9 pg (ref 26.0–34.0)
MCHC: 34.1 g/dL (ref 30.0–36.0)
MCV: 96.7 fL (ref 80.0–100.0)
Platelets: 116 10*3/uL — ABNORMAL LOW (ref 150–400)
RBC: 3.31 MIL/uL — ABNORMAL LOW (ref 4.22–5.81)
RDW: 11.2 % — ABNORMAL LOW (ref 11.5–15.5)
WBC: 8.3 10*3/uL (ref 4.0–10.5)
nRBC: 0 % (ref 0.0–0.2)

## 2019-10-25 LAB — RAPID URINE DRUG SCREEN, HOSP PERFORMED
Amphetamines: NOT DETECTED
Barbiturates: NOT DETECTED
Benzodiazepines: POSITIVE — AB
Cocaine: NOT DETECTED
Opiates: NOT DETECTED
Tetrahydrocannabinol: NOT DETECTED

## 2019-10-25 LAB — DIFFERENTIAL
Abs Immature Granulocytes: 0.05 10*3/uL (ref 0.00–0.07)
Basophils Absolute: 0 10*3/uL (ref 0.0–0.1)
Basophils Relative: 0 %
Eosinophils Absolute: 0 10*3/uL (ref 0.0–0.5)
Eosinophils Relative: 0 %
Immature Granulocytes: 1 %
Lymphocytes Relative: 21 %
Lymphs Abs: 1.7 10*3/uL (ref 0.7–4.0)
Monocytes Absolute: 1 10*3/uL (ref 0.1–1.0)
Monocytes Relative: 13 %
Neutro Abs: 5.3 10*3/uL (ref 1.7–7.7)
Neutrophils Relative %: 65 %

## 2019-10-25 LAB — PHOSPHORUS: Phosphorus: 2.3 mg/dL — ABNORMAL LOW (ref 2.5–4.6)

## 2019-10-25 LAB — CK: Total CK: 813 U/L — ABNORMAL HIGH (ref 49–397)

## 2019-10-25 LAB — MAGNESIUM: Magnesium: 1.9 mg/dL (ref 1.7–2.4)

## 2019-10-25 MED ORDER — POTASSIUM PHOSPHATES 15 MMOLE/5ML IV SOLN
10.0000 mmol | Freq: Once | INTRAVENOUS | Status: AC
  Administered 2019-10-25: 10 mmol via INTRAVENOUS
  Filled 2019-10-25: qty 3.33

## 2019-10-25 MED ORDER — POTASSIUM PHOSPHATES 15 MMOLE/5ML IV SOLN
20.0000 mmol | Freq: Once | INTRAVENOUS | Status: DC
  Filled 2019-10-25: qty 6.67

## 2019-10-25 MED ORDER — CHLORDIAZEPOXIDE HCL 25 MG PO CAPS
25.0000 mg | ORAL_CAPSULE | Freq: Three times a day (TID) | ORAL | Status: AC
  Administered 2019-10-26 (×4): 25 mg via ORAL
  Filled 2019-10-25 (×3): qty 1

## 2019-10-25 MED ORDER — POTASSIUM PHOSPHATES 15 MMOLE/5ML IV SOLN
20.0000 mmol | Freq: Once | INTRAVENOUS | Status: AC
  Administered 2019-10-25: 20 mmol via INTRAVENOUS
  Filled 2019-10-25: qty 6.67

## 2019-10-25 MED ORDER — POTASSIUM CHLORIDE CRYS ER 20 MEQ PO TBCR
30.0000 meq | EXTENDED_RELEASE_TABLET | Freq: Once | ORAL | Status: AC
  Administered 2019-10-25: 30 meq via ORAL
  Filled 2019-10-25: qty 1

## 2019-10-25 MED ORDER — LORAZEPAM 2 MG/ML IJ SOLN
2.0000 mg | Freq: Once | INTRAMUSCULAR | Status: AC
  Administered 2019-10-25: 2 mg via INTRAVENOUS

## 2019-10-25 MED ORDER — DEXTROSE IN LACTATED RINGERS 5 % IV SOLN: INTRAVENOUS | Status: AC

## 2019-10-25 NOTE — Progress Notes (Signed)
Date: 10/25/2019  Patient name: Chase Parsons  Medical record number: [id number]  Date of birth: 07/16/1955   I have seen and evaluated Chase Parsons and discussed their care with the Residency Team.  In brief, patient is a 63 year old male with a past medical history of alcohol use disorder and major depressive disorder who presented to the ED with weakness and frequent falls x1 week.  History obtained from chart as patient is unable to provide a history at this time.  Over the last week patient has noted weakness, lightheadedness and occasional chest pain upon standing from a seated or lying position.  Patient also has had frequent falls but denied loss of consciousness or hitting his head.  Patient also noted weakness and cramping in his lower extremities at rest.  Patient also complained of episodes of nausea and vomiting as well as odynophagia and decreased oral intake.  Patient also had pain with defecation and some bright red blood in his stool.  Over the last several months patient is also had episodes of auditory and visual hallucinations.  These hallucinations typically occur in the morning and improve over the course of the day.  Unable to obtain a good review of systems as patient is now responding to majority of questions (states that he wants to sleep).  Patient is able to tell us his name and that he is in the hospital but did not respond to other questions saying that he is trying to sleep  PMHx, Fam Hx, and/or Soc Hx : As per resident admit note  Vitals:   10/25/19 0821 10/25/19 1204  BP: 120/89 117/89  Pulse: (!) 120   Resp: (!) 21 18  Temp: 98.9 F (37.2 C) 98.9 F (37.2 C)  SpO2: 99% 98%   General: Asleep but easily arousable, no apparent distress, oriented x2 but did not respond to the questions saying that he is trying to sleep CVS: Tachycardic, normal heart sounds Lungs: CTA bilaterally Abdomen: Soft, nontender, nondistended, normoactive bowel  sounds Extremities: No edema noted, nontender to palpation Psych: Sleepy and appears irritable HEENT: Normocephalic, atraumatic Skin: Warm and dry  Assessment and Plan: I have seen and evaluated the patient as outlined above. I agree with the formulated Assessment and Plan as detailed in the residents' note, with the following changes:   1.  Acute alcohol withdrawal: -Patient presented to the ED with worsening weakness and falls over the last week and was noted to be in acute alcohol withdrawal on admission.  Patient was noted to have elevated CIWA scores overnight ranging from 5-15.  He has required approximately 8 mg of Ativan since his admission. -Continue to monitor CIWA scores closely and use Ativan as needed. -Patient started on Librium 25 mg 3 times daily for alcohol withdrawal -Continue folic acid, thiamine and multivitamin -Patient LFTs only mildly elevated (AST 66 on admission and currently 60) likely secondary to alcohol use -He was also noted to have a mildly elevated CK up to 813.  This is likely secondary to his frequent falls.  No evidence of seizures.  We will continue with IV fluids for now. -Patient was also noted to be hyponatremic on admission with a sodium 125 which is increased to 130 over the last day and a half.  The etiology of his hyponatremia remains uncertain at this time possibly secondary to beer Potomania versus hypovolemic hyponatremia.  Patient's rate of correction is appropriate.  We will continue to monitor sodium closely. -We will supplement electrolytes as needed including  potassium magnesium.  Monitor for refeeding syndrome (monitor phosphate levels when he resumes oral diet)  2.  Bright red blood per rectum: -Patient did complain of bright red blood per rectum on admission but his fecal occult blood test was negative and his hemoglobin has remained stable at 10.9. -We will continue to monitor his CBC closely -We will follow-up anemia work-up -No further  work-up at this time.  May need follow-up with GI as an outpatient  Aldine Contes, MD 6/30/20211:10 PM

## 2019-10-25 NOTE — Discharge Summary (Signed)
Name: Chase Parsons MRN: [id number] DOB: 1956-04-04 64 y.o. PCP: Marliss Coots, NP  Date of Admission: 10/23/2019  8:31 PM Date of Discharge: 11/07/2019 Attending Physician: Oda Kilts, MD  Discharge Diagnosis: 1. Alcohol abuse 2. Hepatic steatosis 3. Esophageal varices 4. Anemia of chronic disease  Discharge Medications: Allergies as of 11/07/2019   No Known Allergies     Medication List    STOP taking these medications   potassium chloride SA 20 MEQ tablet Commonly known as: KLOR-CON   traZODone 50 MG tablet Commonly known as: DESYREL     TAKE these medications   FLUoxetine 10 MG capsule Commonly known as: PROZAC Take 1 capsule (10 mg total) by mouth daily.   folic acid 1 MG tablet Commonly known as: FOLVITE Take 1 tablet (1 mg total) by mouth daily. Start taking on: November 08, 2019   multivitamin with minerals Tabs tablet Take 1 tablet by mouth daily. Start taking on: November 08, 2019   pantoprazole 40 MG tablet Commonly known as: PROTONIX Take 1 tablet (40 mg total) by mouth daily. Start taking on: November 08, 2019   thiamine 100 MG tablet Take 1 tablet (100 mg total) by mouth daily. Start taking on: November 08, 2019            Durable Medical Equipment  (From admission, onward)         Start     Ordered   11/07/19 1447  For home use only DME 4 wheeled rolling walker with seat  Once       Question:  Patient needs a walker to treat with the following condition  Answer:  Weakness   11/07/19 1447          Disposition and follow-up:   Mr.Jhamal Deeg was discharged from [facility] in Good condition.  At the hospital follow up visit please address:  1.  Follow up A. Alcohol abuse - assess nutrition and abstinence B. Hepatic steatosis - assess for progression of alcohol-related disease C. Esophageal varices - consider upper endoscopy to better evaluate early paraesophageal varices seen on CT D. Anemia of chronic  disease - follow up on blood counts  2.  Labs / imaging needed at time of follow-up: BMP, Mg, LFTs, upper endoscopy, CBC  3.  Pending labs/ test needing follow-up: none  Follow-up Appointments:  Follow-up Information    [facility].   Specialty: Rehabilitation Why: they will contact you on your sister's phone, if you do not hear from them in three days please give them a call. Contact information: [address] [place] [id number] [facility].   Why: rollator Contact information: [place] [address] [phone]               Hospital Course by problem list: 1. Alcohol Withdrawal Patient with hx significant for homelessness and EtOH abuse presented to the ED with AMS, weakness, and frequent falls. CIWA scores were elevated, he was started on prn Ativan and librium. Patient required high doses of Ativan for approximately 6 days, during which CIWA scores gradually improved. He had a few episodes of tachycardia in the 160s, once with mild troponin elevation which was decreasing on repeat troponin. Most likely a type II MI. Though CIWA scores were improving, patient's mental status was comparatively lagging behind. MRI brain was unremarkable. Started on high dose thiamine.  Mental status continued to improve after this. Wernicke encephalopathy vs alcohol withdrawal vs benzodiazepine induced. He had several electrolyte abnormalities which were repleted. At time of discharge, patient is alert and oriented but still moves slowly and speaks softly. Family states this is his baseline.   2. Anemia of chronic disease Patient presented with mild anemia. Iron studies consistent with anemia of chronic disease. Also had MCV with slight elevation. B12 levels normal, methylmalonic acid level normal. Most recent hgb 10.0  3. Hepatic steatosis,  esophageal varices Reports intermittent abdominal pain. 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 AST 66, ALT 37. Pain improved with Protonics. Follow up with outpatient GI to evaluate varices.   4. Fever of Unknown Etiology, likely 2/2 alcohol withdrawal Had some fevers with max of 103.3, last on 7/1. Had UA with nitrites, but culture <10,000 bacteria. Had 1 day of vanc, 3 of cefepime. Blood cultures negative.    Discharge Vitals:   BP 117/77 (BP Location: Right Arm)   Pulse 85   Temp 97.9 F (36.6 C) (Oral)   Resp 13   Ht 5\' 9"  (1.753 m)   Wt 66.2 kg   SpO2 97%   BMI 21.55 kg/m   Pertinent Labs, Studies, and Procedures:  CT Head Wo Contrast  Result Date: 10/24/2019 CLINICAL DATA:  Right frontal headache. EXAM: CT HEAD WITHOUT CONTRAST TECHNIQUE: Contiguous axial images were obtained from the base of the skull through the vertex without intravenous contrast. COMPARISON:  October 21, 2018. FINDINGS: Brain: Mild diffuse cortical atrophy is noted. Mild chronic ischemic white matter disease is noted. No mass effect or midline shift is noted. Ventricular size is within normal limits. There is no evidence of mass lesion, hemorrhage or acute infarction. Vascular: No hyperdense vessel or unexpected calcification. Skull: Normal. Negative for fracture or focal lesion. Sinuses/Orbits: No acute finding. Other: None. IMPRESSION: Mild diffuse cortical atrophy. Mild chronic ischemic white matter disease. No acute intracranial abnormality seen. Electronically Signed   By: Marijo Conception M.D.   On: 10/24/2019 10:45   CT Abdomen Pelvis W Contrast  Result Date: 10/24/2019 CLINICAL DATA:  Abdominal pain. EXAM: CT ABDOMEN AND PELVIS WITH CONTRAST TECHNIQUE: Multidetector CT imaging of the abdomen and pelvis was performed using the standard protocol following  bolus administration of intravenous contrast. CONTRAST:  180mL OMNIPAQUE IOHEXOL 300 MG/ML  SOLN COMPARISON:  CT scan 10/15/2018 FINDINGS: Lower chest: The lung bases are clear of acute process. No pleural effusion or pulmonary lesions. The heart is normal in size. No pericardial effusion. Coronary artery calcifications are noted. The distal esophagus and aorta are unremarkable. Hepatobiliary: Diffuse fatty infiltration of the liver but no focal hepatic lesions or intrahepatic biliary dilatation. The liver contour is slightly irregular and suspect portal venous hypertension, portal venous collaterals and early esophageal varices. Findings suggest cirrhosis. The portal and hepatic veins are patent. The gallbladder is unremarkable. No common bile duct dilatation. Pancreas: No mass, inflammation or ductal dilatation. Spleen: Normal size. No focal lesions. Adrenals/Urinary Tract: Adrenal glands are unremarkable. Bilateral renal calculi but no obstructing ureteral calculi or bladder calculi. No worrisome renal lesions are identified. No collecting system abnormalities other than the renal calculi demonstrated. Stomach/Bowel: The stomach, duodenum, small bowel and colon are unremarkable. No acute inflammatory changes, mass lesions or obstructive findings. The terminal ileum is normal. The appendix is normal. Moderate descending colon and sigmoid colon diverticulosis but  no findings for acute diverticulitis. Vascular/Lymphatic: Scattered atherosclerotic calcifications involving the aorta and iliac arteries but no aneurysm or dissection. The branch vessels are patent. The major venous structures are patent. No mesenteric or retroperitoneal mass or adenopathy. Small scattered lymph nodes are noted. Reproductive: The prostate gland and seminal vesicles are unremarkable. Other: No pelvic mass or adenopathy. No free pelvic fluid collections. No inguinal mass or adenopathy. No abdominal wall hernia or subcutaneous lesions.  Musculoskeletal: No significant bony findings. IMPRESSION: 1. No acute abdominal/pelvic findings, mass lesions or adenopathy. 2. 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 3. Bilateral renal calculi but no obstructing ureteral calculi or bladder calculi. Aortic Atherosclerosis (ICD10-I70.0). Electronically Signed   By: Marijo Sanes M.D.   On: 10/24/2019 10:52   DG Knee Complete 4 Views Left  Result Date: 10/24/2019 CLINICAL DATA:  Trauma secondary to a fall. EXAM: LEFT KNEE - COMPLETE 4+ VIEW COMPARISON:  None. FINDINGS: No evidence of fracture, dislocation, or joint effusion. No evidence of arthropathy or other focal bone abnormality. Soft tissues are unremarkable. IMPRESSION: Negative. Electronically Signed   By: Lorriane Shire M.D.   On: 10/24/2019 08:01     Recent Labs    11/05/19 0432 11/06/19 0447 11/07/19 0536  MG 1.2* 1.3* 1.3*   BMP Latest Ref Rng & Units 11/07/2019 11/06/2019 11/05/2019  Glucose 70 - 99 mg/dL 85 89 88  BUN 8 - 23 mg/dL 7(L) 7(L) 5(L)  Creatinine 0.61 - 1.24 mg/dL 0.78 0.89 0.71  Sodium 135 - 145 mmol/L 140 140 140  Potassium 3.5 - 5.1 mmol/L 3.6 3.6 3.5  Chloride 98 - 111 mmol/L 101 105 106  CO2 22 - 32 mmol/L 25 25 23   Calcium 8.9 - 10.3 mg/dL 9.8 9.3 9.4    CBC Latest Ref Rng & Units 11/05/2019 11/01/2019 10/31/2019  WBC 4.0 - 10.5 K/uL 7.8 6.3 5.6  Hemoglobin 13.0 - 17.0 g/dL 10.0(L) 10.6(L) 9.6(L)  Hematocrit 39 - 52 % 31.0(L) 32.9(L) 29.8(L)  Platelets 150 - 400 K/uL 444(H) 408(H) 337   Discharge Instructions: Discharge Instructions    Diet - low sodium heart healthy   Complete by: As directed    Discharge instructions   Complete by: As directed    Mr. Bailon, it has been a pleasure taking care of you. Here are my discharge instructions.  1. Start Protonics 40mg  once daily 2. Start multivitamin once  daily 3. Start folic acid once daily 4. Start thiamine once daily 5. Follow up with the [facility]. They will call you to make an appointment. 6. Follow up with Gastroenterology.   Increase activity slowly   Complete by: As directed       Signed: Andrew Au, MD 11/07/2019, 3:14 PM   Pager: [phone]

## 2019-10-25 NOTE — Progress Notes (Signed)
Paged for fever and drop in BP. Evaluated patient bedside. Patient is on CIWA w/ Ativan, drop in BP correlated with last dose of 1 mg ativan.He is resting comfortably and not cooperative on exam. He does ask me to leave him alone, but did allow for a physical exam. On exam he is diaphoretic, tachycardic, MAP is in mid 70's . Sating well on room air. Patients had a temp on admission and initial workup workup has been unrevealing for a sign of infection, including UA, chest xray, and CBC. Blood cultures were drawn at this time. With help of sitter and RN patient was examined for possible wounds, but unrevealing. It is suggestive patients fever is from withdrawal. BP drop is explained by treatment and MAP reassuring. Will continue to follow blood cultures for now. No further workup at this time.

## 2019-10-25 NOTE — Evaluation (Signed)
Clinical/Bedside Swallow Evaluation Patient Details  Name: Chase Parsons MRN: [id number] Date of Birth: 10-23-1955  Today's Date: 10/25/2019 Time: SLP Start Time (ACUTE ONLY): 1357 SLP Stop Time (ACUTE ONLY): 1410 SLP Time Calculation (min) (ACUTE ONLY): 13 min  Past Medical History:  Past Medical History:  Diagnosis Date  . Alcohol abuse   . Pneumonia    Past Surgical History: History reviewed. No pertinent surgical history. HPI:  Pt is a 64 yo male presenting with weakness and frequent falls x1 week. He also reported N/V and odynophagia. He is admitted with acute alcohol withdrawal; under CIWA protocol. CXR, CT Head, and CT Abdomen clear on admission. PMH includes: PNA, alcohol abuse   Assessment / Plan / Recommendation Clinical Impression  Pt's underlying swallow function appears grossly functional, although with audible swallows noted across consistencies. What appears to put him at greatest risk is his mentation, which is altered and likely further impacted by medication effects as pt is receiving ativan. He has slow oral preparation and when offered a thin liquid wash, this is the only time that he begins coughing. Recommended starting Dys 2 (chopped) diet and thin liquids but only when pt is fully alert and asking for POs. Initially this might not equate to sufficient PO intake, but hopefully this will improve with time. Will f/u for safety and potential to advance pending improved mentation.  SLP Visit Diagnosis: Dysphagia, unspecified (R13.10)    Aspiration Risk  Moderate aspiration risk;Mild aspiration risk    Diet Recommendation Dysphagia 2 (Fine chop);Thin liquid   Liquid Administration via: Cup;Straw Medication Administration: Whole meds with liquid Supervision: Staff to assist with self feeding;Full supervision/cueing for compensatory strategies Compensations: Minimize environmental distractions;Slow rate;Small sips/bites Postural Changes: Seated upright at 90 degrees     Other  Recommendations Oral Care Recommendations: Oral care QID   Follow up Recommendations  (tba)      Frequency and Duration min 2x/week  2 weeks       Prognosis Prognosis for Safe Diet Advancement: Good Barriers to Reach Goals: Cognitive deficits      Swallow Study   General HPI: Pt is a 64 yo male presenting with weakness and frequent falls x1 week. He also reported N/V and odynophagia. He is admitted with acute alcohol withdrawal; under CIWA protocol. CXR, CT Head, and CT Abdomen clear on admission. PMH includes: PNA, alcohol abuse Type of Study: Bedside Swallow Evaluation Previous Swallow Assessment: none in chart Diet Prior to this Study: NPO Temperature Spikes Noted: Yes (102.9) Respiratory Status: Room air History of Recent Intubation: No Behavior/Cognition: Lethargic/Drowsy;Cooperative;Requires cueing Oral Cavity Assessment: Within Functional Limits Oral Care Completed by SLP: No Oral Cavity - Dentition: Poor condition Vision: Functional for self-feeding Self-Feeding Abilities: Needs assist Patient Positioning: Upright in bed Baseline Vocal Quality: Normal    Oral/Motor/Sensory Function Overall Oral Motor/Sensory Function: Generalized oral weakness   Ice Chips Ice chips: Not tested   Thin Liquid Thin Liquid: Impaired Presentation: Straw Pharyngeal  Phase Impairments: Cough - Immediate (x1 when used as liquid wash)    Nectar Thick Nectar Thick Liquid: Not tested   Honey Thick Honey Thick Liquid: Not tested   Puree Puree: Impaired Oral Phase Functional Implications: Prolonged oral transit   Solid     Solid: Impaired Oral Phase Functional Implications: Prolonged oral transit;Impaired mastication Pharyngeal Phase Impairments: Cough - Immediate (when trying to clear with thin liquids)      Osie Bond., M.A. [facility] Pager [phone] Office [phone]  10/25/2019,2:21 PM

## 2019-10-25 NOTE — Progress Notes (Signed)
Subjective: Overnight Events:  [name] continues to have intermittent fevers and persistent tachycardia. Electrolytes improved overnight except potassium which showed a minor decrease. Improvement across the board for liver and kidney function. Mr Chase Parsons experienced lower blood pressure in the 84Z systolic last night, likely due to his ativan dosing. This has stabilized today with values in the 100-120s/70-90s. Continues to sat well on RA.  [name] was seen at bedside this morning resting comfortably. He denied persistence of his abdominal pain, nausea, and vomiting. Discussed that we are actively treating his alcohol withdrawal and continue to search for signs of infection to explain his fever. Additionally discussed the possible need in the future of EGD to visualize his esophageal varices and/or initiate prophylactic therapy. He was disinclined to engage in conversation this morning stating that he was tired and was being asked the same questions repeatedly. Pt was left comfortably in bed and will follow-up this afternoon.  Objective: Vital signs in last 24 hours: Vitals:   10/25/19 0517 10/25/19 0600 10/25/19 0700 10/25/19 0821  BP: 112/81 124/90 114/86 120/89  Pulse: (!) 117 (!) 122 (!) 116 (!) 120  Resp: 20   (!) 21  Temp:    98.9 F (37.2 C)  TempSrc:    Axillary  SpO2:    99%  Weight:      Height:       Weight change: 3.175 kg  Intake/Output Summary (Last 24 hours) at 10/25/2019 1017 Last data filed at 10/25/2019 0608 Gross per 24 hour  Intake 2375 ml  Output 925 ml  Net 1450 ml   Physical Exam: Physical Exam Constitutional:      Appearance: Normal appearance.  Cardiovascular:     Rate and Rhythm: Regular rhythm. Tachycardia present.     Pulses: Normal pulses.     Heart sounds: Normal heart sounds. No murmur heard.  No friction rub. No gallop.   Pulmonary:     Effort: Pulmonary effort is normal.     Breath sounds: Normal breath sounds.  Abdominal:      General: Abdomen is flat. Bowel sounds are normal. There is no distension.     Palpations: Abdomen is soft.     Tenderness: There is no abdominal tenderness.  Skin:    General: Skin is warm and dry.  Neurological:     Mental Status: He is alert.    Lab Results: CBC Latest Ref Rng & Units 10/25/2019 10/25/2019 10/23/2019  WBC 4.0 - 10.5 K/uL 8.3 8.4 6.6  Hemoglobin 13.0 - 17.0 g/dL 10.9(L) 11.2(L) 11.1(L)  Hematocrit 39 - 52 % 32.0(L) 31.8(L) 31.6(L)  Platelets 150 - 400 K/uL 116(L) 105(L) 91(L)   CMP Latest Ref Rng & Units 10/25/2019 10/24/2019 10/24/2019  Glucose 70 - 99 mg/dL 114(H) 92 -  BUN 8 - 23 mg/dL 5(L) 10 -  Creatinine 0.61 - 1.24 mg/dL 1.01 0.87 -  Sodium 135 - 145 mmol/L 130(L) 130(L) -  Potassium 3.5 - 5.1 mmol/L 3.0(L) 3.9 -  Chloride 98 - 111 mmol/L 96(L) 97(L) -  CO2 22 - 32 mmol/L 23 20(L) -  Calcium 8.9 - 10.3 mg/dL 7.8(L) 7.8(L) -  Total Protein 6.5 - 8.1 g/dL 7.1 7.7 8.4(H)  Total Bilirubin 0.3 - 1.2 mg/dL 1.9(H) 1.8(H) 2.1(H)  Alkaline Phos 38 - 126 U/L 55 59 65  AST 15 - 41 U/L 60(H) 66(H) 79(H)  ALT 0 - 44 U/L 33 37 44   Assessment/Plan: Principal Problem:   Alcohol withdrawal (HCC) Active Problems:  Falls frequently   Alcohol use disorder, moderate, dependence (HCC)   Hyponatremia  #Alcohol Withdrawal #Hepatic Steatosis 2/2 to EtOH use. Intermittent fever, restlessness, and tachycardia over night. Difficult to ascertain pt's status given resistance to speaking with the team. Denies persistence of his n/v and abdominal pain. Labs overnight showed improvement in AST and Tbili.  - Continue CIWA protocol with 1-4 mg ativan q1h PRN - Continue Librium TID - Continue Thiamine and fluids - Continue PPI - Check on feeding and drinking status at follow up this afternoon  #Fever of Unknown Etiology No clear source of infection at this time. Negative UA, CXR, CT Chest and Abdomen. Blood cultures sent yesterday and <24 hr results returned negative this morning.  Mr Chase Parsons has been afebrile since 2 AM this morning. It is probable that his fever and tachycardia are due to his alcohol withdrawal but will continue to monitor. - Follow blood culture for signs of infection   #Hyponatremia Improving at appropriate rate from 125 two days ago to 130 this morning. - Continue D5 1/2 nml saline - Correct to no greater than 136 by tomorrow  #Hypomagnesemia #Hypophosphatemia #Hypokalemia Over night magnesium and phosphate levels improving. Potassium decreased from 3.9 to 3.0 likely due to refeeding. - Continue to replete Mg, P, and K - Repeat BMP q8h  #Elevated CK Minor elevation in CK over night to 813. With pt's history of seizures with alcohol withdrawal this is possibly the cause of his elevated CK. Possible that this will peak today or tomorrow if so. Will continue to monitor for signs of rhabdo. - Recheck CK tonight - Continue D5 and 1/2 nml saline  #Anemia No signs of upper GI bleed including hematemesis, coffee ground emesis, or melena. Despite pt reported hematochezia, FOBT was negative. Probable cause of anemia is the pt's liver cirrhosis. Nonetheless his Hgb has declined to 10.9 this morning from 11.2 last night. This appears to be within the range of his baseline. - Repeat CBC tonight to ensure no continued decrease - Continue to replete B12/Folate  #Esophageal Varices Minor esphageal varices seen on abdominal CT. No evidence of upper GI bleed at this time. - Consider outpatient beta-blocker for variceal hemorrhage ppx - Will likely need an EGD in the future. Will discuss with pt at follow up this afternoon  This is a Careers information officer Note.  The care of the patient was discussed with Dr. Dareen Piano and the assessment and plan formulated with their assistance.  Please see their attached note for official documentation of the daily encounter.   LOS: 1 day   Calvert Cantor, Medical Student 10/25/2019, 10:17 AM

## 2019-10-25 NOTE — Significant Event (Addendum)
Rapid Response Event Note  Overview:Called d/t increased CIWA score. RRT was called earlier(2034) d/t CIWA-25, however, RN was treating it per CIWA protocol and didn't need RRT. RRT called later at 2158 to evaluate pt.   Initial Focused Assessment: Pt laying in bed with eyes closed, restless. Pt will respond to repeated verbal stimulation, say his name, and follow some simple commands.  Pt tachycardic and tachypneic, tremors seen in all 4 extremities, and pt's palms sweaty. Pt closes eyes when not stimulated, though still restless. CIWA-15. T-103.3, HR-150, BP-113/81, RR-24, SpO2-97% on 2L [place].   Interventions: 2mg  ativan given IV Tylenol 650mg  supp for fever(already ordered) Plan of Care (if not transferred): Treat fever. Continue to monitor pt and treat with ativan per CIWA protocol. Call RRT if further assistance needed.  Event Summary:  MD Court Joy notified by bedside RN PTA RRT and came to see pt  Called: [phone] Arrived: 2205 Ended: [address], Carren Rang

## 2019-10-25 NOTE — Progress Notes (Signed)
Patient's CIWA and temp reassessed, as PRN tylenol was given. Temp now 102.9. BP soft , 92/71. HR 120s at rest. MEWS 6. Dr. Court Joy paged and made aware. Stated he would be at bedside to assess patient. Awaiting arrival.

## 2019-10-25 NOTE — Progress Notes (Signed)
Patient alert and oriented to self. Resting in low bed, sitter at bedside. IVF infusing. Patient denies any pain. ST on telemetry HR 130-140's at rest. CIWA protocol. PRN ativan as required. UA collected and sent for urine drug screen as ordered. Bed alarm is on. Pending ordered blood draw collection, lab contacted. Will continue to monitor.

## 2019-10-25 NOTE — Progress Notes (Signed)
Patient remains scoring 13 on CIWA protocol, too early for next dose. This nurse has remained in room to attempt to redirect patient when he attempts to get OOB or remove equipment. Dr. Court Joy called and notified, OK'd additional dose of ativan as charted on MAR. VSS.

## 2019-10-25 NOTE — Progress Notes (Signed)
Dr. Court Joy at bedside.

## 2019-10-25 NOTE — Progress Notes (Signed)
Patient CIWA 25 to 15, PRN ativan given x2 for a total of 8mg  with little relief. Patient sustaining HR in the 140-160s and RR in the 30-40s. Patient is disoriented x4, pulling away in protective like behavior with severe tremors, refusing direct care and unable to follow commands to take po meds, specifically his Librium. Staff unable to draw labs at this time. Labs postponed until patient decreases in tremors and agitation. MD Court Joy made aware of patient status.

## 2019-10-26 LAB — PHOSPHORUS: Phosphorus: 3.7 mg/dL (ref 2.5–4.6)

## 2019-10-26 LAB — COMPREHENSIVE METABOLIC PANEL
ALT: 30 U/L (ref 0–44)
AST: 50 U/L — ABNORMAL HIGH (ref 15–41)
Albumin: 2.7 g/dL — ABNORMAL LOW (ref 3.5–5.0)
Alkaline Phosphatase: 45 U/L (ref 38–126)
Anion gap: 10 (ref 5–15)
BUN: 6 mg/dL — ABNORMAL LOW (ref 8–23)
CO2: 24 mmol/L (ref 22–32)
Calcium: 7.7 mg/dL — ABNORMAL LOW (ref 8.9–10.3)
Chloride: 96 mmol/L — ABNORMAL LOW (ref 98–111)
Creatinine, Ser: 0.84 mg/dL (ref 0.61–1.24)
GFR calc Af Amer: >60 mL/min (ref >60)
GFR calc non Af Amer: >60 mL/min (ref >60)
Glucose, Bld: 106 mg/dL — ABNORMAL HIGH (ref 70–99)
Potassium: 3.2 mmol/L — ABNORMAL LOW (ref 3.5–5.1)
Sodium: 130 mmol/L — ABNORMAL LOW (ref 135–145)
Total Bilirubin: 1.8 mg/dL — ABNORMAL HIGH (ref 0.3–1.2)
Total Protein: 6.5 g/dL (ref 6.5–8.1)

## 2019-10-26 LAB — BASIC METABOLIC PANEL
Anion gap: 12 (ref 5–15)
BUN: 6 mg/dL — ABNORMAL LOW (ref 8–23)
CO2: 21 mmol/L — ABNORMAL LOW (ref 22–32)
Calcium: 7.8 mg/dL — ABNORMAL LOW (ref 8.9–10.3)
Chloride: 97 mmol/L — ABNORMAL LOW (ref 98–111)
Creatinine, Ser: 0.76 mg/dL (ref 0.61–1.24)
GFR calc Af Amer: >60 mL/min (ref >60)
GFR calc non Af Amer: >60 mL/min (ref >60)
Glucose, Bld: 91 mg/dL (ref 70–99)
Potassium: 4.5 mmol/L (ref 3.5–5.1)
Sodium: 130 mmol/L — ABNORMAL LOW (ref 135–145)

## 2019-10-26 LAB — CBC
HCT: 31.6 % — ABNORMAL LOW (ref 39.0–52.0)
Hemoglobin: 10.8 g/dL — ABNORMAL LOW (ref 13.0–17.0)
MCH: 32.9 pg (ref 26.0–34.0)
MCHC: 34.2 g/dL (ref 30.0–36.0)
MCV: 96.3 fL (ref 80.0–100.0)
Platelets: 118 10*3/uL — ABNORMAL LOW (ref 150–400)
RBC: 3.28 MIL/uL — ABNORMAL LOW (ref 4.22–5.81)
RDW: 11.2 % — ABNORMAL LOW (ref 11.5–15.5)
WBC: 8.4 10*3/uL (ref 4.0–10.5)
nRBC: 0 % (ref 0.0–0.2)

## 2019-10-26 LAB — PROCALCITONIN: Procalcitonin: 0.65 ng/mL

## 2019-10-26 LAB — MAGNESIUM
Magnesium: 1.1 mg/dL — ABNORMAL LOW (ref 1.7–2.4)
Magnesium: 1.8 mg/dL (ref 1.7–2.4)

## 2019-10-26 MED ORDER — POTASSIUM CHLORIDE 10 MEQ/100ML IV SOLN
10.0000 meq | INTRAVENOUS | Status: AC
  Administered 2019-10-26 (×4): 10 meq via INTRAVENOUS
  Filled 2019-10-26 (×2): qty 100

## 2019-10-26 MED ORDER — DEXTROSE IN LACTATED RINGERS 5 % IV SOLN: INTRAVENOUS | Status: AC

## 2019-10-26 MED ORDER — VANCOMYCIN HCL 750 MG/150ML IV SOLN
750.0000 mg | Freq: Two times a day (BID) | INTRAVENOUS | Status: DC
  Administered 2019-10-26: 750 mg via INTRAVENOUS
  Filled 2019-10-26 (×2): qty 150

## 2019-10-26 MED ORDER — SODIUM CHLORIDE 0.9 % IV SOLN
2.0000 g | Freq: Three times a day (TID) | INTRAVENOUS | Status: DC
  Administered 2019-10-26 – 2019-10-28 (×8): 2 g via INTRAVENOUS
  Filled 2019-10-26 (×6): qty 2
  Filled 2019-10-26: qty 0.1
  Filled 2019-10-26 (×2): qty 2
  Filled 2019-10-26: qty 0.1

## 2019-10-26 MED ORDER — VANCOMYCIN HCL 1500 MG/300ML IV SOLN
1500.0000 mg | Freq: Once | INTRAVENOUS | Status: AC
  Administered 2019-10-26: 1500 mg via INTRAVENOUS
  Filled 2019-10-26: qty 300

## 2019-10-26 MED ORDER — MAGNESIUM SULFATE 2 GM/50ML IV SOLN
2.0000 g | Freq: Once | INTRAVENOUS | Status: AC
  Administered 2019-10-26: 2 g via INTRAVENOUS
  Filled 2019-10-26: qty 50

## 2019-10-26 MED ORDER — MAGNESIUM SULFATE 4 GM/100ML IV SOLN
4.0000 g | Freq: Once | INTRAVENOUS | Status: AC
  Administered 2019-10-26: 4 g via INTRAVENOUS
  Filled 2019-10-26: qty 100

## 2019-10-26 MED ORDER — POTASSIUM CHLORIDE CRYS ER 10 MEQ PO TBCR
30.0000 meq | EXTENDED_RELEASE_TABLET | Freq: Two times a day (BID) | ORAL | Status: DC
  Administered 2019-10-26: 30 meq via ORAL
  Filled 2019-10-26: qty 3

## 2019-10-26 NOTE — Progress Notes (Signed)
Patient CIWA improved to 5 and 3. Patient is in bed with eyes closed, no visible tremors, calm, oriented to self and location. Patient able to take po medication.

## 2019-10-26 NOTE — Progress Notes (Signed)
SLP Cancellation Note  Patient Details Name: Rolando Hessling MRN: [id number] DOB: 1956-02-16   Cancelled treatment:       Reason Eval/Treat Not Completed: Fatigue/lethargy limiting ability to participate (Pt is now asleep after receiving Ativan last night and again this morning. SLP will follow up on subsequent date unless contacted sooner by staff.)  Tobie Poet I. Hardin Negus, [place], [place] Office number [phone] Pager [place] 10/26/2019, 10:19 AM

## 2019-10-26 NOTE — Progress Notes (Signed)
Pt stable. Pt is resting in bed. No signs of distress noted. NO needs or concerns at this time. Pt is calm and sleeping. Minimal signs of withdrawal.

## 2019-10-26 NOTE — Progress Notes (Signed)
Subjective: Overnight Events:  Mr. Debois remains on CIWA protocol. Last night his evening librium dose was withheld due to agitation. Around 10 PM he experienced an episode of worsening agitation, tremors,  tachycardia to 150, fever to Tmax 103.3, and tachypnea to 41. His CIWA score at the time was 25. After receiving a dose of 2 mg Ativan in addition to his CIWA protocol, he relaxed with his vitals stabilizing and his CIWA score improving to 3 at 12 AM. Since then Mr Renwick has remained afebrile and resumed his Librium. He still has persistent tachycardia. Additionally, he was evaluated by SLP yesterday and placed on a dysphagia 2 diet.  Mr. Morrone was seen at bedside this morning lying comfortably in bed. Upon being woken he complained of a recurrence of his abdominal pain starting last night though he did not complain of n/v. Overall, Mr. Villagran appears in a similar state this morning as compared to yesterday. According to his nurse, Mr Maniscalco has had minimal PO intake of liquids outside of what is necessary for taking his meds. He ate a single meal last night but otherwise has had no food intake. We were unable to do ROS given pt's pain and disinclination to speak with the team.   Objective: Vital signs in last 24 hours: Vitals:   10/26/19 0500 10/26/19 0503 10/26/19 0600 10/26/19 0823  BP: 104/82  91/63 113/65  Pulse:   96 (!) 108  Resp: (!) 24  20 14   Temp:    97.9 F (36.6 C)  TempSrc:    Oral  SpO2: 100%  98% 97%  Weight:  64.4 kg    Height:       Weight change: 0 kg  Intake/Output Summary (Last 24 hours) at 10/26/2019 1057 Last data filed at 10/26/2019 2706 Gross per 24 hour  Intake 1136.92 ml  Output 2250 ml  Net -1113.08 ml   Physical Exam Constitutional:      General: He is not in acute distress.    Appearance: Normal appearance.  Cardiovascular:     Rate and Rhythm: Regular rhythm. Tachycardia present.     Heart sounds: Normal heart sounds. No murmur heard.    No friction rub. No gallop.   Pulmonary:     Effort: Pulmonary effort is normal.     Breath sounds: Normal breath sounds.  Abdominal:     General: Abdomen is flat. Bowel sounds are normal. There is no distension.     Tenderness: There is abdominal tenderness. There is no guarding or rebound.  Musculoskeletal:     Right lower leg: No edema.     Left lower leg: No edema.  Skin:    General: Skin is warm and dry.  Neurological:     Mental Status: He is alert.    Lab Results: CBC Latest Ref Rng & Units 10/26/2019 10/25/2019 10/25/2019  WBC 4.0 - 10.5 K/uL 8.4 8.3 8.4  Hemoglobin 13.0 - 17.0 g/dL 10.8(L) 10.9(L) 11.2(L)  Hematocrit 39 - 52 % 31.6(L) 32.0(L) 31.8(L)  Platelets 150 - 400 K/uL 118(L) 116(L) 105(L)   CMP Latest Ref Rng & Units 10/26/2019 10/25/2019 10/24/2019  Glucose 70 - 99 mg/dL 106(H) 114(H) 92  BUN 8 - 23 mg/dL 6(L) 5(L) 10  Creatinine 0.61 - 1.24 mg/dL 0.84 1.01 0.87  Sodium 135 - 145 mmol/L 130(L) 130(L) 130(L)  Potassium 3.5 - 5.1 mmol/L 3.2(L) 3.0(L) 3.9  Chloride 98 - 111 mmol/L 96(L) 96(L) 97(L)  CO2 22 - 32 mmol/L 24 23 20(L)  Calcium 8.9 - 10.3 mg/dL 7.7(L) 7.8(L) 7.8(L)  Total Protein 6.5 - 8.1 g/dL 6.5 7.1 7.7  Total Bilirubin 0.3 - 1.2 mg/dL 1.8(H) 1.9(H) 1.8(H)  Alkaline Phos 38 - 126 U/L 45 55 59  AST 15 - 41 U/L 50(H) 60(H) 66(H)  ALT 0 - 44 U/L 30 33 37    Assessment/Plan: Principal Problem:   Alcohol withdrawal (HCC) Active Problems:   Falls frequently   Alcohol use disorder, moderate, dependence (HCC)   Hyponatremia  #Alcohol Withdrawal #Hepatic Steatosis 2/2 to EtOH use. Continued intermittent fevers and agitation with persistent tachycardia. Mr Panek was unable to receive his evening dose of Librium due to his agitation which may have contributed to his worsened overnight symptoms. Labs from this morning show continued improvement in his AST and Tbili. Minimal intake of PO fluids and food. We will continue to hydrate and follow dysphagia 2  diet as we're able to, being aware for possible electrolyte changes with refeeding. -Continue CIWA protocol with PRN Ativan - Continue Librium 25 mg TID, consider alternative if he struggles PO intake again today or tonight -Continue D5 LR 75 ml/hr -Continue Thiamine, Folate, and Multivitamin -Continue PPI  #Elevated Procalcitonin #Fever of Unkown Etiology Though Mr. Henrickson's alcohol withdrawal could be causing or contributing to his fevers and tachycardia, his elevated procalcitonin level this morning to 0.65 is concerning for possible infection. His blood culture results continue to show no growth thus far. -Start IV Vancomycin and Cefepime per pharmacy, day 1 -Follow final blood culture results -Continue PRN Acetaminophen    #Hyponatremia Appears euvolemic on exam and Na this morning was stable at 130. This requires no further correction at this time.  #Hypomagnesemia #Hypophosphatemia #Hypokalemia Labs this morning showed improvement in phosphorous to 3.7. This requires no further intervention. Magnesium and Potassium remain low so we will continue to replete these until sufficient correction has been achieved. - Mag Sulfate 4 mg - Potassium Chloride 30 mg BID - Repeat Mag level at 3:00 PM. - Repeat CMP tomorrow  #Epigastric Pain Epigastric pain and tenderness to palpation likely due to gastritis. Currently being treated with PPI, will likely need EGD in future for confirmation. -Consult GI outpatient pending improvement in withdrawal status -Discuss barriers to follow up with pt given his social, living, and financial conditions.  #Elevated CK Likely due to fall or possible withdrawal seizure prior to admission at [facility]. Will recheck to ensure this is improving. -Recheck CK tomorrow morning  #Anemia This appears stable at this time. No significant changes in his Hgb over night. Continue to monitor and remain vigilant for changes. -Repeat CBC tomorrow.  This is a Location manager Note.  The care of the patient was discussed with Dr. Rebeca Alert and the assessment and plan formulated with their assistance.  Please see their attached note for official documentation of the daily encounter.   LOS: 2 days   Calvert Cantor, Medical Student 10/26/2019, 10:57 AM

## 2019-10-26 NOTE — Progress Notes (Signed)
°   10/26/19 2056  Assess: MEWS Score  Temp (!) 100.5 F (38.1 C)  Assess: MEWS Score  MEWS Temp 1  MEWS Systolic 0  MEWS Pulse 2  MEWS RR 1  MEWS LOC 0  MEWS Score 4  MEWS Score Color Red  Assess: if the MEWS score is Yellow or Red  Were vital signs taken at a resting state? Yes  Focused Assessment Documented focused assessment  Early Detection of Sepsis Score *See Row Information* Low  MEWS guidelines implemented *See Row Information* No, previously red, continue vital signs every 4 hours  Treat  MEWS Interventions Administered scheduled meds/treatments  Notify: Charge Nurse/RN  Name of Charge Nurse/RN Notified [place], RN  Date Charge Nurse/RN Notified 10/26/19  Time Charge Nurse/RN Notified 2100  Document  Patient Outcome Other (Comment) (CIWA-TR decreased after prn tylneol)  Progress note created (see row info) Yes   Patient MEWS changed to red due to increase in patient TR along with already elevated HR.  Patient has been red previously and will continue vital signs every four hours and as needed. PRNs given for patient pain and TR.

## 2019-10-26 NOTE — Progress Notes (Signed)
   10/26/19 1933  Assess: MEWS Score  Temp 99.3 F (37.4 C)  BP (!) 150/84  ECG Heart Rate (!) 122  Resp (!) 24  Level of Consciousness Alert  SpO2 98 %  O2 Device Room Air  Assess: MEWS Score  MEWS Temp 0  MEWS Systolic 0  MEWS Pulse 2  MEWS RR 1  MEWS LOC 0  MEWS Score 3  MEWS Score Color Yellow  Assess: if the MEWS score is Yellow or Red  Were vital signs taken at a resting state? Yes  Focused Assessment Documented focused assessment  Early Detection of Sepsis Score *See Row Information* Low  MEWS guidelines implemented *See Row Information* No, previously yellow, continue vital signs every 4 hours  Treat  MEWS Interventions Administered prn meds/treatments  Notify: Charge Nurse/RN  Name of Charge Nurse/RN Notified [place], RN  Date Charge Nurse/RN Notified 10/26/19  Time Charge Nurse/RN Notified 1945  Document  Patient Outcome Other (Comment) (CIWA-HR remains elevated)  Progress note created (see row info) Yes   Patient has a yellow MEWS score due to elevated HR.  Patient has been previously yellow and red MEWS and will continue with vital sign frequency of every four hours and as needed. Patient is on CIWA protocol and receiving prn ativan.

## 2019-10-26 NOTE — Progress Notes (Signed)
Pharmacy Antibiotic Note  Chase Parsons is a 64 y.o. male admitted on 10/23/2019 with weakness, possible GI bleed and alcohol withdrawal; now with concern for sepsis.  Pharmacy has been consulted for vancomycin and cefepime dosing.  Plan: Vancomycin 1500 mg IV x 1, then 750 mg IV every 12 hours (target trough 15-20) Cefepime 2g IV every 8 hours Add MRSA PCR Monitor renal function, Cx/PCR and clinical progression to narrow Vancomycin trough at steady state  Height: 5\' 9"  (175.3 cm) Weight: 64.4 kg (142 lb) IBW/kg (Calculated) : 70.7  Temp (24hrs), Avg:99.9 F (37.7 C), Min:97.9 F (36.6 C), Max:103.3 F (39.6 C)  Recent Labs  Lab 10/23/19 2108 10/24/19 1645 10/25/19 0021 10/25/19 0607 10/26/19 0552  WBC 6.6  --  8.4 8.3 8.4  CREATININE 1.36* 0.87  --  1.01 0.84  LATICACIDVEN  --  1.3  --   --   --     Estimated Creatinine Clearance: 80.9 mL/min (by C-G formula based on SCr of 0.84 mg/dL).    No Known Allergies  Antimicrobials this admission: Vanc 7/1>> Cefepime 7/1>>  Dose adjustments this admission: n/a  Microbiology results: 7/1 BCx: sent   Bertis Ruddy, PharmD Clinical Pharmacist Please check AMION for all [place] numbers 10/26/2019 10:55 AM

## 2019-10-27 ENCOUNTER — Inpatient Hospital Stay (HOSPITAL_COMMUNITY): Payer: Medicaid Other

## 2019-10-27 LAB — COMPREHENSIVE METABOLIC PANEL
ALT: 31 U/L (ref 0–44)
AST: 50 U/L — ABNORMAL HIGH (ref 15–41)
Albumin: 2.8 g/dL — ABNORMAL LOW (ref 3.5–5.0)
Alkaline Phosphatase: 56 U/L (ref 38–126)
Anion gap: 11 (ref 5–15)
BUN: 6 mg/dL — ABNORMAL LOW (ref 8–23)
CO2: 23 mmol/L (ref 22–32)
Calcium: 8.2 mg/dL — ABNORMAL LOW (ref 8.9–10.3)
Chloride: 97 mmol/L — ABNORMAL LOW (ref 98–111)
Creatinine, Ser: 0.9 mg/dL (ref 0.61–1.24)
GFR calc Af Amer: >60 mL/min (ref >60)
GFR calc non Af Amer: >60 mL/min (ref >60)
Glucose, Bld: 89 mg/dL (ref 70–99)
Potassium: 4 mmol/L (ref 3.5–5.1)
Sodium: 131 mmol/L — ABNORMAL LOW (ref 135–145)
Total Bilirubin: 1.9 mg/dL — ABNORMAL HIGH (ref 0.3–1.2)
Total Protein: 7.1 g/dL (ref 6.5–8.1)

## 2019-10-27 LAB — URINALYSIS, ROUTINE W REFLEX MICROSCOPIC
Bilirubin Urine: NEGATIVE
Glucose, UA: NEGATIVE mg/dL
Hgb urine dipstick: NEGATIVE
Ketones, ur: NEGATIVE mg/dL
Leukocytes,Ua: NEGATIVE
Nitrite: POSITIVE — AB
Protein, ur: NEGATIVE mg/dL
Specific Gravity, Urine: 1.017 (ref 1.005–1.030)
pH: 7 (ref 5.0–8.0)

## 2019-10-27 LAB — CBC
HCT: 33.4 % — ABNORMAL LOW (ref 39.0–52.0)
Hemoglobin: 11.2 g/dL — ABNORMAL LOW (ref 13.0–17.0)
MCH: 32.9 pg (ref 26.0–34.0)
MCHC: 33.5 g/dL (ref 30.0–36.0)
MCV: 98.2 fL (ref 80.0–100.0)
Platelets: 156 10*3/uL (ref 150–400)
RBC: 3.4 MIL/uL — ABNORMAL LOW (ref 4.22–5.81)
RDW: 11.4 % — ABNORMAL LOW (ref 11.5–15.5)
WBC: 8.7 10*3/uL (ref 4.0–10.5)
nRBC: 0 % (ref 0.0–0.2)

## 2019-10-27 LAB — MRSA PCR SCREENING: MRSA by PCR: NEGATIVE

## 2019-10-27 LAB — MAGNESIUM: Magnesium: 1.6 mg/dL — ABNORMAL LOW (ref 1.7–2.4)

## 2019-10-27 LAB — TROPONIN I (HIGH SENSITIVITY)
Troponin I (High Sensitivity): 130 ng/L (ref <18)
Troponin I (High Sensitivity): 105 ng/L (ref <18)

## 2019-10-27 LAB — CK: Total CK: 402 U/L — ABNORMAL HIGH (ref 49–397)

## 2019-10-27 LAB — PHOSPHORUS: Phosphorus: 2 mg/dL — ABNORMAL LOW (ref 2.5–4.6)

## 2019-10-27 MED ORDER — CHLORDIAZEPOXIDE HCL 25 MG PO CAPS
25.0000 mg | ORAL_CAPSULE | Freq: Three times a day (TID) | ORAL | Status: DC
  Administered 2019-10-27 – 2019-10-28 (×3): 25 mg via ORAL
  Filled 2019-10-27 (×3): qty 1

## 2019-10-27 MED ORDER — POTASSIUM PHOSPHATES 15 MMOLE/5ML IV SOLN
10.0000 mmol | Freq: Once | INTRAVENOUS | Status: DC
  Filled 2019-10-27: qty 3.33

## 2019-10-27 MED ORDER — POTASSIUM PHOSPHATES 15 MMOLE/5ML IV SOLN
20.0000 mmol | Freq: Once | INTRAVENOUS | Status: AC
  Administered 2019-10-27: 20 mmol via INTRAVENOUS
  Filled 2019-10-27: qty 6.67

## 2019-10-27 MED ORDER — LACTATED RINGERS IV BOLUS
500.0000 mL | Freq: Once | INTRAVENOUS | Status: AC
  Administered 2019-10-27: 500 mL via INTRAVENOUS

## 2019-10-27 MED ORDER — LORAZEPAM 1 MG PO TABS
1.0000 mg | ORAL_TABLET | ORAL | Status: DC | PRN
  Administered 2019-10-27 – 2019-10-28 (×4): 1 mg via ORAL
  Filled 2019-10-27 (×5): qty 1

## 2019-10-27 MED ORDER — SODIUM CHLORIDE 0.9 % IV SOLN
INTRAVENOUS | Status: DC | PRN
  Administered 2019-10-27 – 2019-10-30 (×4): 250 mL via INTRAVENOUS
  Administered 2019-11-03 – 2019-11-07 (×2): 1000 mL via INTRAVENOUS

## 2019-10-27 MED ORDER — DEXTROSE IN LACTATED RINGERS 5 % IV SOLN: INTRAVENOUS | Status: DC

## 2019-10-27 MED ORDER — ORAL CARE MOUTH RINSE
15.0000 mL | Freq: Two times a day (BID) | OROMUCOSAL | Status: DC
  Administered 2019-10-28 – 2019-11-07 (×18): 15 mL via OROMUCOSAL

## 2019-10-27 MED ORDER — LORAZEPAM 2 MG/ML IJ SOLN
1.0000 mg | INTRAMUSCULAR | Status: DC | PRN
  Administered 2019-10-27 (×3): 2 mg via INTRAVENOUS
  Administered 2019-10-28: 1 mg via INTRAVENOUS
  Filled 2019-10-27 (×4): qty 1

## 2019-10-27 MED ORDER — MAGNESIUM SULFATE 2 GM/50ML IV SOLN
2.0000 g | Freq: Once | INTRAVENOUS | Status: AC
  Administered 2019-10-27: 2 g via INTRAVENOUS
  Filled 2019-10-27: qty 50

## 2019-10-27 NOTE — Progress Notes (Signed)
SLP Cancellation Note  Patient Details Name: Chase Parsons MRN: [id number] DOB: Aug 26, 1955   Cancelled treatment:       Reason Eval/Treat Not Completed: Fatigue/lethargy limiting ability to participate (Pt was given Ativan and was unable to demonstrate an adequate level of alertness despite verbal and tactile stimulation.)  Kristol Almanzar I. Hardin Negus, [place], [place] Office number [phone] Pager [place] 10/27/2019, 10:48 AM

## 2019-10-27 NOTE — Progress Notes (Signed)
   10/27/19 0800  Assess: MEWS Score  Temp 99.9 F (37.7 C)  BP 102/72  Pulse Rate (!) 115  ECG Heart Rate (!) 115  Resp (!) 40  Level of Consciousness Alert  SpO2 99 %  Assess: MEWS Score  MEWS Temp 0  MEWS Systolic 0  MEWS Pulse 2  MEWS RR 3  MEWS LOC 0  MEWS Score 5  MEWS Score Color Red  Assess: if the MEWS score is Yellow or Red  Were vital signs taken at a resting state? Yes  Focused Assessment Documented focused assessment  Early Detection of Sepsis Score *See Row Information* Medium  MEWS guidelines implemented *See Row Information* Yes  Treat  MEWS Interventions Administered scheduled meds/treatments;Administered prn meds/treatments  Take Vital Signs  Increase Vital Sign Frequency  Red: Q 1hr X 4 then Q 4hr X 4, if remains red, continue Q 4hrs  Notify: Charge Nurse/RN  Name of Charge Nurse/RN Notified  (GRACE)  Date Charge Nurse/RN Notified 10/27/19  Time Charge Nurse/RN Notified 0801  Notify: Provider  Provider Name/Title  (internal residents came to room)  Date Provider Notified 10/27/19  Time Provider Notified [phone]  Notification Type Face-to-face  Notification Reason Other (Comment)  Response See new orders  Date of Provider Response 10/27/19  Time of Provider Response 0840  Document  Patient Outcome Stabilized after interventions  Progress note created (see row info) Yes  Internal medicine aware of fluctuating MEWS, new orders placed, continue to monitor.

## 2019-10-27 NOTE — Progress Notes (Signed)
CSW received consult regarding substance use and homelessness. CSW will continue to follow for patient's mentation improvement.   Odean Mcelwain LCSW

## 2019-10-27 NOTE — Progress Notes (Signed)
Night Team Progress Note Paged by RN for elevated CIWA, tachycardia.  Evaluated pt bedside with senior resident. Patient resting in bed, mildly agitated. Reports "hurting all over," but speech difficult to understand, unclear if having CP. On exam, he is tachycardic, MAP 93, sating well on RA. Cardiac exam unremarkable, rhonchi heard bilaterally but pt uncooperative for full lung exam. Abdomen non-distended, tender to palpation in all four quadrants.   #Tachycardia - HR elevated 110's-120's during admission -Given elevated HR, ECG ordered -ECG revealed sinus tachycardia, new T-wave inversions V3-V6 (compared to ECG 10/24/19) -T-wave changes present on tele 7/1 in AM -Unclear of CP, but unable to r/o -Trop pending  #EtOH Withdrawal -CIWA previously elevated during course of admission -CIWA 15 improved to 3 after Ativan -Last dose of Librium at 2100 7/1, did not re-start order d/t concerns for not being able to take po -Continuing Ativan 1-4mg  PRN per CIWA protocol  #Abdominal Pain -Per day team notes, pt had abdominal tenderness prior to this evening -No evidence of acute abdomen -If pt develops ascites, given hx of cirrhosis and febrile, SBP on differential (CT scan on 6/29 showed no ascites)

## 2019-10-27 NOTE — Progress Notes (Addendum)
Subjective: Overnight Events:  [name] remains tachycardiac with nocturnal fevers and tachypnea. He still has poor PO intake outside of his oral medications. He was able to receive his PO Librium yesterday evening but has received none today. His CIWA score peaked at 15 around 1:30 AM but improved after 2 mg Ativan. Since then he has had one additional 2 mg dose at 3:45 AM.  At approximately 4 AM this morning, he was reportedly mildly agitated and tachycardic to the 120's. Mr. Demonte complained of pain all over his body but was unable to communicate the quality or location of his pain due to his agitation and limited speech. ECG was performed and showed new T-wave inversion in V3-V6. As a result Trop levels were ordered.   Mr. Gropp was evaluated at bedside this morning and was noted to be sleeping comfortably upon arrival. After being woken he responded minimally to questions but did note that he still had abdominal pain. Though it was difficult to to discern exactly what he was saying, it appears that he was having some new rectal pain. Examination of his anus revealed no abnormalities. We were unable to identify if he had chest pain at the time.  His nurse confirmed that he continues to have limited PO intake and had a single liquidy bowel movement yesterday but none since then. Mr Waltman was left sleeping peacefully in his bed.  Objective: Vital signs in last 24 hours: Vitals:   10/27/19 0454 10/27/19 0700 10/27/19 0800 10/27/19 0900  BP:  116/86 102/72 109/81  Pulse:  (!) 119 (!) 115 (!) 109  Resp:  (!) 21 (!) 40 (!) 21  Temp:  99.2 F (37.3 C) 99.9 F (37.7 C) 99.9 F (37.7 C)  TempSrc:  Oral    SpO2:  94% 99% 100%  Weight: 64 kg     Height:       Weight change: -0.454 kg  Intake/Output Summary (Last 24 hours) at 10/27/2019 1102 Last data filed at 10/27/2019 0452 Gross per 24 hour  Intake 787.08 ml  Output 1575 ml  Net -787.92 ml   Physical Exam Constitutional:       General: He is not in acute distress. Cardiovascular:     Rate and Rhythm: Regular rhythm. Tachycardia present.     Pulses: Normal pulses.     Heart sounds: No murmur heard.  No friction rub. No gallop.   Pulmonary:     Effort: Pulmonary effort is normal.     Breath sounds: Normal breath sounds.  Abdominal:     General: There is no distension.     Palpations: Abdomen is soft.     Tenderness: There is abdominal tenderness. There is no guarding or rebound.  Musculoskeletal:     Right lower leg: No edema.     Left lower leg: No edema.  Skin:    General: Skin is warm and dry.    Lab Results: CBC Latest Ref Rng & Units 10/27/2019 10/26/2019 10/25/2019  WBC 4.0 - 10.5 K/uL 8.7 8.4 8.3  Hemoglobin 13.0 - 17.0 g/dL 11.2(L) 10.8(L) 10.9(L)  Hematocrit 39 - 52 % 33.4(L) 31.6(L) 32.0(L)  Platelets 150 - 400 K/uL 156 118(L) 116(L)   CMP Latest Ref Rng & Units 10/27/2019 10/26/2019 10/26/2019  Glucose 70 - 99 mg/dL 89 91 106(H)  BUN 8 - 23 mg/dL 6(L) 6(L) 6(L)  Creatinine 0.61 - 1.24 mg/dL 0.90 0.76 0.84  Sodium 135 - 145 mmol/L 131(L) 130(L) 130(L)  Potassium 3.5 - 5.1  mmol/L 4.0 4.5 3.2(L)  Chloride 98 - 111 mmol/L 97(L) 97(L) 96(L)  CO2 22 - 32 mmol/L 23 21(L) 24  Calcium 8.9 - 10.3 mg/dL 8.2(L) 7.8(L) 7.7(L)  Total Protein 6.5 - 8.1 g/dL 7.1 - 6.5  Total Bilirubin 0.3 - 1.2 mg/dL 1.9(H) - 1.8(H)  Alkaline Phos 38 - 126 U/L 56 - 45  AST 15 - 41 U/L 50(H) - 50(H)  ALT 0 - 44 U/L 31 - 30    Studies/Results: DG CHEST PORT 1 VIEW IMPRESSION: No acute cardiopulmonary findings. Electronically Signed   By: Marijo Sanes M.D.   On: 10/27/2019 07:09   DG Abd Portable 1V IMPRESSION: Nondistended nonspecific air-filled loops of small large bowel noted. No focal abnormality identified. Electronically Signed   By: Marcello Moores  Register   On: 10/27/2019 09:49   Assessment/Plan: Principal Problem:   Alcohol withdrawal ([place]) Active Problems:   Falls frequently   Alcohol use disorder, moderate,  dependence ([place])   Hyponatremia  Mr. Footman is a 64 year old male with a PMHx of homelessness, EtOH abuse, and MDD who presented with frequent falls and AMS on 10/23/19 and was admitted for alcohol withdrawal and hyponatremia.  #Alcohol Withdrawal #Hepatic Steatosis 2/2 EtOH Use Overnight tachycardia, tachypnea, and agitation continued. He will remain on CIWA protocol. Appears to be resting well this morning and has not required a dose of Ativan in several hours. Hepatic and renal function are stable on labs this morning. Will continue to monitor PO intake status and provide fluids and food as appropriate. -Continue CIWA protocol -Continue Librium as tolerated with limited PO intake -Start LR 500 mL infusion -Continue Thiamine, Folate, and Multivitamin -Continue PPI  #Fever of Unknown Etiology (Presumed Urinary Source) Low grade fever to 100.9 last night. UA concerning for a UTI with positive nitrites and bacteria. Will await culture results. Currently broad spectrum coverage with Cefepime.  Negative repeat CXR this morning, though the quality of the image may have limited its value. Negative blood culture x 3 days. Will continue to follow. Nasal MRSA swab returned negative so will discontinue Vancomycin at this time. -Continue IV Cefepime, day 2 -Continue PRN Acetaminophen -Discontinue IV Vancomycin -Follow-up Urine culture    #Type 2 NSTEMI On evaluation around 4 AM this morning, new T wave inversions were noted in V3-V6 and an elevation of Troponin I to 130. Repeat level at 8 AM dropped to 105. Likely due to demand ischemia from persistent tachycardia over the last several days. It is possible that Mr. Batalla also has some predisposing unknown CAD. Unless he presents with future s/s concerning for cardiac pathology no further evaluation is warranted.  #Hypomagnesemia #Hypophosphatemia #Hypokalemia Morning labs show decrease in Phosphorous and Magnesium. Potassium remains at an  appropriate level. Will replete accordingly. -Mg 1.6, Start Mg Sulf 2g/56mL @ 50 mL/hr for 1 hr -P 2.0, Start K-Phos 20 mmol/ 500 mL D5 @ 19mL/hr for 6 hrs -Repeat BMP, Mg, P tomorrow morning   #Abdominal Pain Likely 2/2 Gastritis Pt still complains of abdominal pain today. He has had minimal stool volume within the last 48 hours so a KUB was ordered which returned normal. No changes in management needed at this time. -Continue PPI -Follow-up GI outpatient  #Elevated CK Repeat CK level this morning was 402 from 813 two days ago. Improvement encouraging that rhabdomyolysis is unlikely. No further management at this time.   This is a Careers information officer Note.  The care of the patient was discussed with Dr. Rebeca Alert and  the assessment and plan formulated with their assistance.  Please see their attached note for official documentation of the daily encounter.   LOS: 3 days   Calvert Cantor, Medical Student 10/27/2019, 11:02 AM

## 2019-10-27 NOTE — Plan of Care (Signed)
  Problem: Health Behavior/Discharge Planning: Goal: Ability to manage health-related needs will improve Outcome: Progressing   Problem: Clinical Measurements: Goal: Ability to maintain clinical measurements within normal limits will improve Outcome: Progressing   Problem: Clinical Measurements: Goal: Respiratory complications will improve Outcome: Progressing   Problem: Clinical Measurements: Goal: Cardiovascular complication will be avoided Outcome: Progressing   Problem: Activity: Goal: Risk for activity intolerance will decrease Outcome: Progressing   Problem: Nutrition: Goal: Adequate nutrition will be maintained Outcome: Progressing   Problem: Safety: Goal: Ability to remain free from injury will improve Outcome: Progressing   Problem: Skin Integrity: Goal: Risk for impaired skin integrity will decrease Outcome: Progressing

## 2019-10-28 LAB — BASIC METABOLIC PANEL
Anion gap: 8 (ref 5–15)
BUN: 5 mg/dL — ABNORMAL LOW (ref 8–23)
CO2: 25 mmol/L (ref 22–32)
Calcium: 8.2 mg/dL — ABNORMAL LOW (ref 8.9–10.3)
Chloride: 94 mmol/L — ABNORMAL LOW (ref 98–111)
Creatinine, Ser: 0.75 mg/dL (ref 0.61–1.24)
GFR calc Af Amer: >60 mL/min (ref >60)
GFR calc non Af Amer: >60 mL/min (ref >60)
Glucose, Bld: 106 mg/dL — ABNORMAL HIGH (ref 70–99)
Potassium: 3.4 mmol/L — ABNORMAL LOW (ref 3.5–5.1)
Sodium: 127 mmol/L — ABNORMAL LOW (ref 135–145)

## 2019-10-28 LAB — PHOSPHORUS: Phosphorus: 2.9 mg/dL (ref 2.5–4.6)

## 2019-10-28 LAB — URINE CULTURE: Culture: <10000 — AB

## 2019-10-28 LAB — MAGNESIUM: Magnesium: 1.1 mg/dL — ABNORMAL LOW (ref 1.7–2.4)

## 2019-10-28 MED ORDER — POTASSIUM CHLORIDE 20 MEQ PO PACK
40.0000 meq | PACK | Freq: Two times a day (BID) | ORAL | Status: DC
  Administered 2019-10-28 – 2019-10-29 (×3): 40 meq via ORAL
  Filled 2019-10-28 (×3): qty 2

## 2019-10-28 MED ORDER — MAGNESIUM SULFATE 2 GM/50ML IV SOLN
2.0000 g | Freq: Once | INTRAVENOUS | Status: AC
  Administered 2019-10-28: 2 g via INTRAVENOUS
  Filled 2019-10-28: qty 50

## 2019-10-28 MED ORDER — ALUM & MAG HYDROXIDE-SIMETH 200-200-20 MG/5ML PO SUSP
30.0000 mL | Freq: Once | ORAL | Status: AC
  Administered 2019-10-28: 30 mL via ORAL
  Filled 2019-10-28: qty 30

## 2019-10-28 MED ORDER — LACTATED RINGERS IV SOLN: INTRAVENOUS | Status: DC

## 2019-10-28 MED ORDER — CHLORDIAZEPOXIDE HCL 25 MG PO CAPS
25.0000 mg | ORAL_CAPSULE | Freq: Two times a day (BID) | ORAL | Status: DC
  Administered 2019-10-28 – 2019-10-29 (×2): 25 mg via ORAL
  Filled 2019-10-28 (×2): qty 1

## 2019-10-28 NOTE — Progress Notes (Signed)
Patient complains of indigestion, standing order medication ordered.

## 2019-10-28 NOTE — Progress Notes (Signed)
SLP Cancellation Note  Patient Details Name: Chase Parsons MRN: [id number] DOB: 03-25-1956   Cancelled treatment:       Reason Eval/Treat Not Completed: Patient at procedure or test/unavailable   Elvina Sidle, M.S., CCC-SLP 10/28/2019, 4:02 PM

## 2019-10-28 NOTE — Progress Notes (Addendum)
Subjective: Overnight Events:  Mr. Bushey had a better night last night than his previous three. His CIWA score ranged from 4-12, peaking at 11 PM. He remained afebrile with a stable BP through the night though he remained tachycardic and intermittently tachypneic. He continues to sat well on RA. He received a total of 9 mg of Ativan since 5:40 yesterday evening as compared to 12 mg in the previous 24 hour period. Mr Abdallah has received all doses of Librium since yesterday afternoon. His PO status appears to be improving with successful administration of Librium and PO Ativan.  At bedside this morning Mr Abdullah was noted to be sleeping comfortably. He appeared similar to previous mornings with speech that was difficult to understand and tenderness in his abdomen. There were no new or concerning findings. His nurse this morning noted that he had a single bowel movement last night and appears to be taking in liquids orally. Mr Matsuo was left sleeping in his bed. Objective: Vital signs in last 24 hours: Vitals:   10/28/19 0200 10/28/19 0400 10/28/19 0544 10/28/19 0600  BP: 111/74 109/66 100/85 105/82  Pulse: (!) 122 (!) 109 (!) 122 (!) 114  Resp:  (!) 28  (!) 25  Temp:  99.1 F (37.3 C)    TempSrc:  Oral    SpO2: 100% 97%  94%  Weight:      Height:       Weight change: -0.907 kg  Intake/Output Summary (Last 24 hours) at 10/28/2019 2505 Last data filed at 10/28/2019 3976 Gross per 24 hour  Intake 2886.29 ml  Output 1550 ml  Net 1336.29 ml   Physical Exam Constitutional:      General: He is not in acute distress. Cardiovascular:     Rate and Rhythm: Regular rhythm. Tachycardia present.     Pulses: Normal pulses.     Heart sounds: Normal heart sounds. No murmur heard.  No friction rub. No gallop.   Pulmonary:     Effort: Pulmonary effort is normal.     Breath sounds: Normal breath sounds.  Abdominal:     General: Abdomen is flat. There is no distension.     Palpations:  Abdomen is soft.     Tenderness: There is abdominal tenderness. There is no guarding or rebound.  Musculoskeletal:     Right lower leg: No edema.     Left lower leg: No edema.  Skin:    General: Skin is warm and dry.    Lab Results: CBC Latest Ref Rng & Units 10/27/2019 10/26/2019 10/25/2019  WBC 4.0 - 10.5 K/uL 8.7 8.4 8.3  Hemoglobin 13.0 - 17.0 g/dL 11.2(L) 10.8(L) 10.9(L)  Hematocrit 39 - 52 % 33.4(L) 31.6(L) 32.0(L)  Platelets 150 - 400 K/uL 156 118(L) 116(L)   CMP Latest Ref Rng & Units 10/28/2019 10/27/2019 10/26/2019  Glucose 70 - 99 mg/dL 106(H) 89 91  BUN 8 - 23 mg/dL 5(L) 6(L) 6(L)  Creatinine 0.61 - 1.24 mg/dL 0.75 0.90 0.76  Sodium 135 - 145 mmol/L 127(L) 131(L) 130(L)  Potassium 3.5 - 5.1 mmol/L 3.4(L) 4.0 4.5  Chloride 98 - 111 mmol/L 94(L) 97(L) 97(L)  CO2 22 - 32 mmol/L 25 23 21(L)  Calcium 8.9 - 10.3 mg/dL 8.2(L) 8.2(L) 7.8(L)  Total Protein 6.5 - 8.1 g/dL - 7.1 -  Total Bilirubin 0.3 - 1.2 mg/dL - 1.9(H) -  Alkaline Phos 38 - 126 U/L - 56 -  AST 15 - 41 U/L - 50(H) -  ALT 0 -  44 U/L - 31 -   Assessment/Plan: Principal Problem:   Alcohol withdrawal ([place]) Active Problems:   Falls frequently   Alcohol use disorder, moderate, dependence ([place])   Hyponatremia  Mr. Felicetti is a 64 year old male with a PMHx of homelessness, EtOH abuse, and MDD who presented with frequent falls and AMS on 10/23/19 and was admitted for alcohol withdrawal and hyponatremia.  #Alcohol Withdrawal #Hepatic Steatosis 2/2 EtOH Use Mr Shibata continues to have tachycardia and intermittent tachypnea. He was afebrile last night and has had reduction in his Ativan requirements at 9 mg as compared to previous nights. Will continue CIWA protocol and PO fluids. -Continue CIWA protocol -Continue Librium 25 mg TID -Start LR infusion @ 75 mL/hr -Continue Thiamine, Folate, and Multivitamin -Continue PPI  #Fever of Unknown Etiology Mr. Krone was afebrile last night but had tachycardia and  tachypnea. Urine Culture returned negative today. Will continue to follow fever trend. Continue Cefepime at this time. -Continue IV Cefepime, day 3 -Continue PRN Acetaminophen    #Type 2 NSTEMI Repeat EKG performed yesterday showed no changes. Unless he presents with future s/s concerning for cardiac pathology no further evaluation is warranted.  #Hyponatremia Likely due to D5/LR infusion causing dilutional hyponatremia. Will start LR infusion. -Na 127, Start LR @ 15mL/hr  #Hypomagnesemia #Hypophosphatemia #Hypokalemia Morning labs show decrease in Potassium and Magnesium. Phosphorous remains at an appropriate level. Will replete accordingly. -Mg 1.6, Start Mg Sulf 2g -K 3.4, Start Klor-Con packet 40 mEq BID -Repeat BMP, P, Mg tomorrow morning   #Abdominal Pain Likely 2/2 Gastritis Pt still complains of abdominal pain today. Had a bowel movement last night. No changes in management needed at this time. -Continue PPI -Follow-up GI outpatient   This is a Careers information officer Note.  The care of the patient was discussed with Dr. Philipp Ovens and the assessment and plan formulated with their assistance.  Please see their attached note for official documentation of the daily encounter.   LOS: 4 days   Calvert Cantor, Medical Student 10/28/2019, 6:47 AM   Attestation for Student Documentation:  I personally was present and performed or re-performed the history, physical exam and medical decision-making activities of this service and have verified that the service and findings are accurately documented in the student's note.  Jean Rosenthal, MD 10/28/2019, 2:34 PM

## 2019-10-28 NOTE — Progress Notes (Signed)
   10/28/19 0744  Assess: MEWS Score  Temp 98 F (36.7 C)  BP 102/83  Pulse Rate (!) 107  Resp (!) 33  Level of Consciousness Alert  SpO2 95 %  O2 Device Room Air  Assess: MEWS Score  MEWS Temp 0  MEWS Systolic 0  MEWS Pulse 1  MEWS RR 2  MEWS LOC 0  MEWS Score 3  MEWS Score Color Yellow  Assess: if the MEWS score is Yellow or Red  Were vital signs taken at a resting state? Yes  Focused Assessment Documented focused assessment  Early Detection of Sepsis Score *See Row Information* Low  MEWS guidelines implemented *See Row Information* No, previously yellow, continue vital signs every 4 hours  Treat  MEWS Interventions Administered scheduled meds/treatments  Document  Progress note created (see row info) Yes   Patient has been chronically yellow MEWS due to elevated RR and HR. Patient is now being treated for IV antibiotics for possible UTI and fever. Will continue to monitor CIWA and vitals and give scheduled and PRN medications

## 2019-10-29 DIAGNOSIS — G9340 Encephalopathy, unspecified: Secondary | ICD-10-CM | POA: Diagnosis present

## 2019-10-29 LAB — CULTURE, BLOOD (ROUTINE X 2)
Culture: NO GROWTH
Culture: NO GROWTH

## 2019-10-29 LAB — BASIC METABOLIC PANEL
Anion gap: 11 (ref 5–15)
BUN: 6 mg/dL — ABNORMAL LOW (ref 8–23)
CO2: 23 mmol/L (ref 22–32)
Calcium: 8.7 mg/dL — ABNORMAL LOW (ref 8.9–10.3)
Chloride: 98 mmol/L (ref 98–111)
Creatinine, Ser: 0.68 mg/dL (ref 0.61–1.24)
GFR calc Af Amer: >60 mL/min (ref >60)
GFR calc non Af Amer: >60 mL/min (ref >60)
Glucose, Bld: 91 mg/dL (ref 70–99)
Potassium: 4.3 mmol/L (ref 3.5–5.1)
Sodium: 132 mmol/L — ABNORMAL LOW (ref 135–145)

## 2019-10-29 LAB — MAGNESIUM: Magnesium: 1.2 mg/dL — ABNORMAL LOW (ref 1.7–2.4)

## 2019-10-29 LAB — PHOSPHORUS: Phosphorus: 2.9 mg/dL (ref 2.5–4.6)

## 2019-10-29 MED ORDER — THIAMINE HCL 100 MG/ML IJ SOLN
250.0000 mg | Freq: Every day | INTRAVENOUS | Status: AC
  Administered 2019-10-31 – 2019-11-04 (×5): 250 mg via INTRAVENOUS
  Filled 2019-10-29 (×6): qty 2.5

## 2019-10-29 MED ORDER — MAGNESIUM SULFATE 4 GM/100ML IV SOLN: 4.0000 g | Freq: Once | INTRAVENOUS | Status: DC

## 2019-10-29 MED ORDER — MAGNESIUM SULFATE 2 GM/50ML IV SOLN
2.0000 g | Freq: Once | INTRAVENOUS | Status: AC
  Administered 2019-10-29: 2 g via INTRAVENOUS
  Filled 2019-10-29: qty 50

## 2019-10-29 MED ORDER — CHLORDIAZEPOXIDE HCL 25 MG PO CAPS
25.0000 mg | ORAL_CAPSULE | Freq: Two times a day (BID) | ORAL | Status: AC
  Administered 2019-10-30 (×2): 25 mg via ORAL
  Filled 2019-10-29 (×2): qty 1

## 2019-10-29 MED ORDER — MAGNESIUM SULFATE 2 GM/50ML IV SOLN: 2.0000 g | Freq: Once | INTRAVENOUS | Status: DC

## 2019-10-29 MED ORDER — THIAMINE HCL 100 MG/ML IJ SOLN
500.0000 mg | Freq: Three times a day (TID) | INTRAVENOUS | Status: AC
  Administered 2019-10-29 – 2019-10-31 (×6): 500 mg via INTRAVENOUS
  Filled 2019-10-29 (×6): qty 5

## 2019-10-29 MED ORDER — CHLORDIAZEPOXIDE HCL 25 MG PO CAPS
25.0000 mg | ORAL_CAPSULE | Freq: Every day | ORAL | Status: AC
  Administered 2019-10-31: 25 mg via ORAL
  Filled 2019-10-29: qty 1

## 2019-10-29 MED ORDER — CHLORDIAZEPOXIDE HCL 25 MG PO CAPS
25.0000 mg | ORAL_CAPSULE | Freq: Three times a day (TID) | ORAL | Status: AC
  Administered 2019-10-29: 25 mg via ORAL
  Filled 2019-10-29: qty 1

## 2019-10-29 NOTE — Plan of Care (Signed)
°  Problem: Health Behavior/Discharge Planning: Goal: Ability to manage health-related needs will improve Outcome: Progressing   Problem: Clinical Measurements: Goal: Respiratory complications will improve Outcome: Progressing   Problem: Clinical Measurements: Goal: Cardiovascular complication will be avoided Outcome: Progressing   Problem: Activity: Goal: Risk for activity intolerance will decrease Outcome: Progressing   Problem: Nutrition: Goal: Adequate nutrition will be maintained Outcome: Progressing

## 2019-10-29 NOTE — Progress Notes (Addendum)
Subjective: Per nursing note, patient was more alert overnight but still had some confusion and agitation. His CIWA scores are decreasing and Ativan requirement is improving.   Patient was groggy when examined at bedside this morning, but was responsive to stimuli and followed commands. He reports discomfort, but was unable to specify further. Denies SOB or CP.  Yesterday, we received a call from an Chase Parsons claiming to be the patient's brother. Patient was unable to confirm this at the time due to sedation. This morning, he confirmed that Chase Parsons is his brother and that we may call him and share his medical status.  Objective:  Vital signs in last 24 hours: Vitals:   10/29/19 0300 10/29/19 0400 10/29/19 0559 10/29/19 0900  BP: (!) 96/56 99/75  102/73  Pulse:  (!) 101    Resp:  20    Temp:    99.5 F (37.5 C)  TempSrc:    Oral  SpO2: 98% 100% 100%   Weight:      Height:        Physical Exam Constitutional: no acute distress, somnolent Head: atraumatic ENT: external ears normal Eyes: PERRL Cardiovascular: regular rate and rhythm, normal heart sounds Pulmonary: effort normal, lungs clear to ascultation bilaterally Abdominal: flat, nontender, no rebound tenderness, bowel sounds normal Skin: warm and dry Neurological: somnolent, obeys commands and responds to verbal and painful stimuli  Assessment/Plan: Chase Parsons is a 64 y.o. male with hx of homelessness, EtOH abuse, and MDD who presented with frequent falls and AMSon 10/23/19 andwas admitted for alcohol withdrawal and hyponatremia.  Principal Problem:   Alcohol withdrawal ([place]) Active Problems:   Falls frequently   Alcohol use disorder, moderate, dependence ([facility])   Hyponatremia  Alcohol Withdrawal Hepatic Steatosis 2/2 EtOH Use Chase Parsons continues to have tachycardia and intermittent tachypnea. CIWA improving and Ativan requiring decreasing. Mental status minimally improving, he remains very somnolent though  he does answer questions appropriately and follows commands.  -Continue CIWA protocol -Required 2mg  Ativan in past 24 hours -start tapering Librium, 25 mg TID then BID tomorrow -continue LR infusion @ 75 mL/hr -Continue Folate, and Multivitamin -Continue PPI -switch to high dose folate today: 500mg  TID for 2 days, then 250 daily for 5 days  Encephalopathy Despite improvement in CIWA scores and Ativan requirement, he remains very somnolent with minimal changes in neurologic status. There may be a cause other than his alcohol withdrawal. DDx includes Wernicke encephalopathy, meningitis, sepsis. Though fevers are improving and blood cultures are negative, perhaps empiric antibiotics are masking an infection. -MRI brain -start high dose thiamine as above -monitor for signs of infection -if still encephalopathic tomorrow, obtain LP -attempted to contact brother for more history, but did not answer. Will continue trying   Fever of Unknown Etiology Afebrile since 7/1. Had UA with nitrites, but culture <10,000 bacteria. Had 1 day of vanc, 3 of cefepime.  -stop Abx -continuePRN Acetaminophen  Type 2 NSTEMI On 7/2 had an episode of tachycardia/ EKG with T-wave inversions that were present on previous day as well. Trop pf 130 initially, followed by 105. Most likely demand ischemia secondary to fever and withdrawal symptoms.  -monitor for additional episodes  Hypomagnesemia Hypophosphatemia Hypokalemia Electrolyte abnormalities improving. Repleting as needed.  -Mg 1.2,Start Mg Sulf 24 -K 4.3 -Ph 2.7 -RepeatBMP, P, Mgtomorrow morning  Hyponatremia Presented with hyponatremia, which improved with IV hydration to 130s. Dipped again on 7/3 likely 2/2 D5/LR infusion. D5 stopped. Na 132 today. -continue LR 36ml/hr  Abdominal PainLikely  2/2 Gastritis Pt complains of abdominal pain. No bowel movement last night. 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 Normal transaminases. -Continue PPI -Follow-up GI outpatient  Homelessness Social work following, but has been unable to assess due to mental status.  -Attempted to call brother Chase Parsons, but he did not answer. Left a message with callback number.  Diet: regular IVF: LR 70cc/hr VTE: Lovenox Prior to Admission Living Arrangement: homeless Anticipated Discharge Location: unclear Barriers to Discharge: encephalopathic, electrolyte abnormalities Dispo: Anticipated discharge in approximately 3-5 day(s).   Chase Au, MD 10/29/2019, 10:42 AM Pager: [phone] After 5pm on weekdays and 1pm on weekends: On Call pager [phone]

## 2019-10-29 NOTE — Plan of Care (Signed)
  Problem: Health Behavior/Discharge Planning: °Goal: Ability to manage health-related needs will improve °Outcome: Progressing °  °Problem: Education: °Goal: Knowledge of General Education information will improve °Description: Including pain rating scale, medication(s)/side effects and non-pharmacologic comfort measures °Outcome: Progressing °  °Problem: Activity: °Goal: Risk for activity intolerance will decrease °Outcome: Progressing °  °

## 2019-10-29 NOTE — Progress Notes (Signed)
Patient a little more alert tonight, he is actually using his call bell and asking to use the bathroom. Still has periods of more confusion and agitation. Will continue to monitor.

## 2019-10-30 ENCOUNTER — Inpatient Hospital Stay (HOSPITAL_COMMUNITY): Payer: Medicaid Other

## 2019-10-30 LAB — BASIC METABOLIC PANEL
Anion gap: 9 (ref 5–15)
BUN: <5 mg/dL — ABNORMAL LOW (ref 8–23)
CO2: 25 mmol/L (ref 22–32)
Calcium: 8.7 mg/dL — ABNORMAL LOW (ref 8.9–10.3)
Chloride: 97 mmol/L — ABNORMAL LOW (ref 98–111)
Creatinine, Ser: 0.69 mg/dL (ref 0.61–1.24)
GFR calc Af Amer: >60 mL/min (ref >60)
GFR calc non Af Amer: >60 mL/min (ref >60)
Glucose, Bld: 89 mg/dL (ref 70–99)
Potassium: 4 mmol/L (ref 3.5–5.1)
Sodium: 131 mmol/L — ABNORMAL LOW (ref 135–145)

## 2019-10-30 LAB — CBC WITH DIFFERENTIAL/PLATELET
Abs Immature Granulocytes: 0.05 10*3/uL (ref 0.00–0.07)
Basophils Absolute: 0 10*3/uL (ref 0.0–0.1)
Basophils Relative: 1 %
Eosinophils Absolute: 0.1 10*3/uL (ref 0.0–0.5)
Eosinophils Relative: 2 %
HCT: 29.2 % — ABNORMAL LOW (ref 39.0–52.0)
Hemoglobin: 9.7 g/dL — ABNORMAL LOW (ref 13.0–17.0)
Immature Granulocytes: 1 %
Lymphocytes Relative: 38 %
Lymphs Abs: 2.3 10*3/uL (ref 0.7–4.0)
MCH: 32.9 pg (ref 26.0–34.0)
MCHC: 33.2 g/dL (ref 30.0–36.0)
MCV: 99 fL (ref 80.0–100.0)
Monocytes Absolute: 0.9 10*3/uL (ref 0.1–1.0)
Monocytes Relative: 14 %
Neutro Abs: 2.7 10*3/uL (ref 1.7–7.7)
Neutrophils Relative %: 44 %
Platelets: 257 10*3/uL (ref 150–400)
RBC: 2.95 MIL/uL — ABNORMAL LOW (ref 4.22–5.81)
RDW: 11.3 % — ABNORMAL LOW (ref 11.5–15.5)
WBC: 6 10*3/uL (ref 4.0–10.5)
nRBC: 0 % (ref 0.0–0.2)

## 2019-10-30 LAB — PHOSPHORUS: Phosphorus: 2.8 mg/dL (ref 2.5–4.6)

## 2019-10-30 LAB — MAGNESIUM: Magnesium: 1.3 mg/dL — ABNORMAL LOW (ref 1.7–2.4)

## 2019-10-30 MED ORDER — MAGNESIUM SULFATE 4 GM/100ML IV SOLN
4.0000 g | Freq: Once | INTRAVENOUS | Status: AC
  Administered 2019-10-30: 4 g via INTRAVENOUS
  Filled 2019-10-30: qty 100

## 2019-10-30 NOTE — Progress Notes (Signed)
  Speech Language Pathology Treatment: Dysphagia  Patient Details Name: Chase Parsons MRN: [id number] DOB: December 16, 1955 Today's Date: 10/30/2019 Time: 0156-1537 SLP Time Calculation (min) (ACUTE ONLY): 8 min  Assessment / Plan / Recommendation Clinical Impression  Pt was seen for dysphagia treatment. He was alert with his breakfast was in front of him and started. Pt reported that he ate some of breakfast but then started "barking". He was unable to identify whether the coughing described as "barking" occurred during his meal or provide any further information regarding his swallow function. He refused all solids but consumed thin liquids via straw and no signs of aspiration were demonstrated. SLP will continue to follow pt to assess safety with solids, his ability to safely consume a more advanced diet, and the need for instrumental assessment.   HPI HPI: Pt is a 64 yo male presenting with weakness and frequent falls x1 week. He also reported N/V and odynophagia. He is admitted with acute alcohol withdrawal; under CIWA protocol. CXR, CT Head, and CT Abdomen clear on admission. PMH includes: PNA, alcohol abuse      SLP Plan  Continue with current plan of care       Recommendations  Diet recommendations: Dysphagia 2 (fine chop);Thin liquid Liquids provided via: Cup;Straw Medication Administration: Whole meds with puree Supervision: Staff to assist with self feeding Compensations: Minimize environmental distractions;Slow rate;Small sips/bites Postural Changes and/or Swallow Maneuvers: Seated upright 90 degrees                Oral Care Recommendations: Oral care QID Follow up Recommendations:  (tba) SLP Visit Diagnosis: Dysphagia, unspecified (R13.10) Plan: Continue with current plan of care       Chase Parsons I. Hardin Negus, [place], [place] Office number [phone] Pager [place] 10/30/2019, 11:57 AM

## 2019-10-30 NOTE — Progress Notes (Signed)
Patient more alert, he did stand up (bed alarm went off) and said he was "trying to get his clothes out of the closet". RN informed patient that we didn't want him walking around by himself D/T weakness. Patient agrees and says he doesn't want to fall again and he will call for help next time.

## 2019-10-30 NOTE — Progress Notes (Addendum)
Subjective:  Overnight, nurse noted that he stood up and tried to put on his clothes. She educated him on safety precautions and he is agreeable to asking for help when he wants to get up.  Patient is examined at beside today. He is awake with eyes open spontaneously, which is an improvement. He answers questions slowly and softly, but coherently without requiring stimulation. Denies abdominal pain today, but reports that it is intermittent.  Patient states that he has been homeless for a few months and has been living on the streets, which disagrees with his sister's information.  Objective:  Vital signs in last 24 hours: Vitals:   10/29/19 1623 10/29/19 1923 10/29/19 2348 10/30/19 0400  BP:  98/67 118/75 117/74  Pulse:  89 92   Resp:  20 18 18   Temp: 97.8 F (36.6 C) 98.7 F (37.1 C) 98 F (36.7 C) 98.7 F (37.1 C)  TempSrc: Oral Oral Oral Oral  SpO2:  100% 96% 99%  Weight:    65.8 kg  Height:        Physical Exam Constitutional: no acute distress Head: atraumatic ENT: external ears normal Eyes: EOMI Cardiovascular: regular rate and rhythm, normal heart sounds Pulmonary: effort normal, lungs clear to ascultation bilaterally Abdominal: flat, nontender, no rebound tenderness, bowel sounds normal Skin: warm and dry Neurological: alert, no focal deficit, speaks slowly but coherently Psychiatric: normal mood and affect  Assessment/Plan: Chase Parsons is a 64 y.o. male with hx of homelessness, EtOH abuse, and MDD who presented with frequent falls and AMS on 10/23/19 and was admitted for alcohol withdrawal and hyponatremia.  Principal Problem:   Alcohol withdrawal ([place]) Active Problems:   Falls frequently   Alcohol use disorder, moderate, dependence ([facility])   Hyponatremia   Encephalopathy  Alcohol Withdrawal Mr Interrante continues to have tachycardia and intermittent tachypnea. CIWA improving and no Ativan requirement last night. Mental status significantly improved,  though he still responds slowly and softly. -Continue CIWA protocol -Discontinue Ativan, none required in past 24 hours -continue tapering Librium, 25 mg BID -> qd tomorrow -continue LR infusion @ 75 mL/hr -Continue Folate, and Multivitamin -Continue PPI -continue to high dose thiamine: 500mg  TID for 2 days, then 250 daily for 5 days   Encephalopathy Despite improvement in CIWA scores and Ativan requirement, neurologic status still lagging behind, though he showed significant improvements today.Considering etiologies other than alcohol withdrawal, including Wernicke encephalopathy, meningitis, sepsis. Though fevers are improving and blood cultures are negative, perhaps empiric antibiotics masked an infection. MRI brain 7/5 without acute findings, cerebral volume loss which is advanced for age.  -continue high dose thiamine as above -monitor for signs of infection -holding off on LP now, reconsider if neuro status worsens or signs of infection -per sister, he has been somewhat confused at home which she attributes to history of heavy alcohol use   Hypomagnesemia Hypophosphatemia Hypokalemia Electrolyte abnormalities improving. Repleting as needed.  -Mg 1.3, Start Mag sulfate 4g -K 4.0 -Ph 2.8 -monitor   Hyponatremia Presented with hyponatremia, which improved with IV hydration to 130s. Na 130 today. -continue LR 53ml/hr   Abdominal Pain Likely 2/2 Gastritis Hepatic Steatosis 2/2 EtOH Use No pain at this time, though he states it comes intermittently. 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 Normal transaminases. -Continue PPI -Follow-up GI outpatient   Fever of Unknown Etiology Afebrile since 7/1. Had UA with nitrites, but culture <10,000 bacteria. Had 1 day of vanc, 3 of cefepime.  -Abx stopped -continue PRN Acetaminophen  Type 2 NSTEMI On 7/2 had an episode of tachycardia/ EKG with T-wave inversions that were present on previous day  as well. Trop pf 130 initially, followed by 105. Most likely demand ischemia secondary to fever and withdrawal symptoms.  -monitor for additional episodes  Homelessness Social work following, but has been unable to assess due to mental status. Conflicting from patient and his sister. She states he has a home, but he states he is living on the streets. She does note long-standing history of heavy alcohol use. -will touch base with social work   Diet: regular IVF: LR 70cc/hr VTE: Lovenox Prior to Admission Living Arrangement: home Anticipated Discharge Location: home vs SNF Barriers to Discharge: encephalopathic, electrolyte abnormalities Dispo: Anticipated discharge in approximately 2-4 day(s).   Chase Au, MD 10/30/2019, 6:09 AM Pager: [phone] After 5pm on weekdays and 1pm on weekends: On Call pager [phone]

## 2019-10-30 NOTE — Plan of Care (Signed)
  Problem: Education: Goal: Knowledge of General Education information will improve Description Including pain rating scale, medication(s)/side effects and non-pharmacologic comfort measures Outcome: Progressing   

## 2019-10-30 NOTE — Progress Notes (Signed)
Pt confused and frequently getting out of bed. The alarm on the low bed is not working and there are no more low beds available. No sitters available. Will monitor pt.

## 2019-10-30 NOTE — Evaluation (Signed)
Physical Therapy Evaluation Patient Details Name: Chase Parsons MRN: [id number] DOB: 1956-04-13 Today's Date: 10/30/2019   History of Present Illness  Pt is 64 yo male with hx of homelessness, EtOH abuse, and major depressive disorder.  Pt admitted with frequent falls, AMS, and EtOH withdrawal and hyponatremia. Pt had episode of tachycardia and T-wave inversions on 7/2 with troponin elevated but decreasing - per notes likely demand ischemia secondary to fever and withdrawal symptoms.  Clinical Impression  Pt admitted with above diagnosis. Pt was limited at evaluation due to lethargy.  He required min -mod A for transfers and 5' of ambulation.  Pt fatigued easily and with decreased safety awareness.  All VSS.  Pt with unknown PLOF, hx of falls, and hx of homelessness.  At this time pt is unsafe to return anywhere alone.  Recommend SNF.Marland Kitchen Pt currently with functional limitations due to the deficits listed below (see PT Problem List). Pt will benefit from skilled PT to increase their independence and safety with mobility to allow discharge to the venue listed below.       Follow Up Recommendations SNF    Equipment Recommendations  Rolling walker with 5" wheels    Recommendations for Other Services       Precautions / Restrictions Precautions Precautions: Fall      Mobility  Bed Mobility Overal bed mobility: Needs Assistance Bed Mobility: Supine to Sit;Sit to Supine     Supine to sit: Mod assist;HOB elevated Sit to supine: Mod assist;HOB elevated   General bed mobility comments: increased time; multimodal cues  Transfers Overall transfer level: Needs assistance Equipment used: Rolling walker (2 wheeled) Transfers: Sit to/from Stand Sit to Stand: Min assist         General transfer comment: performed x 2; increased time; multimodal cues for safe hand placement and use of momentum  Ambulation/Gait Ambulation/Gait assistance: Mod assist Gait Distance (Feet): 4  Feet Assistive device: Rolling walker (2 wheeled) Gait Pattern/deviations: Step-to pattern;Decreased stride length;Shuffle;Staggering left;Staggering right Gait velocity: decreased   General Gait Details: unsteady and with posterior lean; requiring mod A for balance and RW  Stairs            Wheelchair Mobility    Modified Rankin (Stroke Patients Only)       Balance Overall balance assessment: Needs assistance Sitting-balance support: Bilateral upper extremity supported;Feet supported Sitting balance-Leahy Scale: Poor Sitting balance - Comments: required use of bil arms for support     Standing balance-Leahy Scale: Poor Standing balance comment: required RW                             Pertinent Vitals/Pain Pain Assessment: No/denies pain    Home Living Family/patient expects to be discharged to:: Skilled nursing facility               Home Equipment: None Additional Comments: Pt repors homeless and has nothing, per chart sister reports pt has a home.    Prior Function           Comments: Pt unable to reports PLOF.  Reports several recent falls and expresses fear of falling.     Hand Dominance        Extremity/Trunk Assessment   Upper Extremity Assessment Upper Extremity Assessment: Generalized weakness;Difficult to assess due to impaired cognition    Lower Extremity Assessment Lower Extremity Assessment: Generalized weakness;Difficult to assess due to impaired cognition    Cervical / Trunk Assessment Cervical /  Trunk Assessment: Normal  Communication      Cognition Arousal/Alertness: Lethargic Behavior During Therapy: WFL for tasks assessed/performed Overall Cognitive Status: No family/caregiver present to determine baseline cognitive functioning                                 General Comments: Pt oriented to self only.  Very lethargic.  Followed ~25% of commands with multimodal cues      General Comments  General comments (skin integrity, edema, etc.): VSS    Exercises     Assessment/Plan    PT Assessment Patient needs continued PT services  PT Problem List Decreased strength;Decreased mobility;Decreased safety awareness;Decreased range of motion;Decreased coordination;Decreased knowledge of precautions;Decreased activity tolerance;Cardiopulmonary status limiting activity;Decreased balance;Decreased knowledge of use of DME       PT Treatment Interventions DME instruction;Therapeutic activities;Gait training;Therapeutic exercise;Patient/family education;Stair training;Balance training;Functional mobility training;Cognitive remediation    PT Goals (Current goals can be found in the Care Plan section)  Acute Rehab PT Goals Patient Stated Goal: not fall anymore PT Goal Formulation: With patient Time For Goal Achievement: 11/13/19 Potential to Achieve Goals: Fair    Frequency Min 2X/week   Barriers to discharge Inaccessible home environment;Decreased caregiver support      Co-evaluation               AM-PAC PT "6 Clicks" Mobility  Outcome Measure Help needed turning from your back to your side while in a flat bed without using bedrails?: A Lot Help needed moving from lying on your back to sitting on the side of a flat bed without using bedrails?: A Lot Help needed moving to and from a bed to a chair (including a wheelchair)?: A Lot Help needed standing up from a chair using your arms (e.g., wheelchair or bedside chair)?: A Lot Help needed to walk in hospital room?: A Lot Help needed climbing 3-5 steps with a railing? : Total 6 Click Score: 11    End of Session Equipment Utilized During Treatment: Gait belt Activity Tolerance: Patient limited by lethargy Patient left: in bed;with call bell/phone within reach (bed alarm not working - Art gallery manager who was checking; pt asleep at this time) Nurse Communication: Mobility status PT Visit Diagnosis: Unsteadiness on feet  (R26.81);History of falling (Z91.81);Muscle weakness (generalized) (M62.81)    Time: 1510-1535 PT Time Calculation (min) (ACUTE ONLY): 25 min   Charges:   PT Evaluation $PT Eval Low Complexity: 1 Low PT Treatments $Therapeutic Activity: 8-22 mins        Abran Richard, PT Acute Rehab Services Pager [phone] [facility] 10/30/2019, 3:45 PM

## 2019-10-31 LAB — COMPREHENSIVE METABOLIC PANEL
ALT: 31 U/L (ref 0–44)
AST: 55 U/L — ABNORMAL HIGH (ref 15–41)
Albumin: 3 g/dL — ABNORMAL LOW (ref 3.5–5.0)
Alkaline Phosphatase: 55 U/L (ref 38–126)
Anion gap: 13 (ref 5–15)
BUN: 6 mg/dL — ABNORMAL LOW (ref 8–23)
CO2: 22 mmol/L (ref 22–32)
Calcium: 9 mg/dL (ref 8.9–10.3)
Chloride: 100 mmol/L (ref 98–111)
Creatinine, Ser: 0.73 mg/dL (ref 0.61–1.24)
GFR calc Af Amer: >60 mL/min (ref >60)
GFR calc non Af Amer: >60 mL/min (ref >60)
Glucose, Bld: 88 mg/dL (ref 70–99)
Potassium: 3.7 mmol/L (ref 3.5–5.1)
Sodium: 135 mmol/L (ref 135–145)
Total Bilirubin: 1.1 mg/dL (ref 0.3–1.2)
Total Protein: 7.2 g/dL (ref 6.5–8.1)

## 2019-10-31 LAB — CBC
HCT: 29.8 % — ABNORMAL LOW (ref 39.0–52.0)
Hemoglobin: 9.6 g/dL — ABNORMAL LOW (ref 13.0–17.0)
MCH: 32.9 pg (ref 26.0–34.0)
MCHC: 32.2 g/dL (ref 30.0–36.0)
MCV: 102.1 fL — ABNORMAL HIGH (ref 80.0–100.0)
Platelets: 337 10*3/uL (ref 150–400)
RBC: 2.92 MIL/uL — ABNORMAL LOW (ref 4.22–5.81)
RDW: 11.8 % (ref 11.5–15.5)
WBC: 5.6 10*3/uL (ref 4.0–10.5)
nRBC: 0 % (ref 0.0–0.2)

## 2019-10-31 LAB — PHOSPHORUS: Phosphorus: 3.7 mg/dL (ref 2.5–4.6)

## 2019-10-31 LAB — MAGNESIUM: Magnesium: 1.7 mg/dL (ref 1.7–2.4)

## 2019-10-31 MED ORDER — MAGNESIUM SULFATE 2 GM/50ML IV SOLN
2.0000 g | Freq: Once | INTRAVENOUS | Status: AC
  Administered 2019-10-31: 2 g via INTRAVENOUS
  Filled 2019-10-31: qty 50

## 2019-10-31 NOTE — Progress Notes (Signed)
  Speech Language Pathology Treatment: Dysphagia  Patient Details Name: Mandeep Kiser MRN: [id number] DOB: 03-21-1956 Today's Date: 10/31/2019 Time: 2536-6440 SLP Time Calculation (min) (ACUTE ONLY): 23 min  Assessment / Plan / Recommendation Clinical Impression  Mr. Champine was asleep upon SLP arrival, but aroused and was repositioned for breakfast. He has no temperatuer spikes and clear CXR. Pt was seen with Dys 2 breakfast tray including ground sausage, eggs, pureed pineapple, and coffee. Pt self fed once tray was set up. He had no coughing or s/s aspiration otherwise. Pt reported "I can eat normal food." Pt was trialed with regular solid graham cracker and had prolonged mastication and oral residue after, which required a cue to clear with liquid wash. Based on progress and ongoing slow oral phase, recommend Dys 3 solids and thin liquids, with assistance/cues to ensure clear oral cavity after boluses. ST service to follow for upgrade to regular as indicated.   HPI HPI: Pt is a 64 yo male presenting with weakness and frequent falls x1 week. He also reported N/V and odynophagia. He is admitted with acute alcohol withdrawal; under CIWA protocol. CXR, CT Head, and CT Abdomen clear on admission. PMH includes: PNA, alcohol abuse      SLP Plan  Continue with current plan of care       Recommendations  Liquids provided via: Cup;Straw Medication Administration: Whole meds with puree Supervision: Staff to assist with self feeding Compensations: Minimize environmental distractions;Slow rate;Small sips/bites;Follow solids with liquid Postural Changes and/or Swallow Maneuvers: Seated upright 90 degrees                Oral Care Recommendations: Oral care QID Follow up Recommendations:  ST to follow for diet advancement as appropriate SLP Visit Diagnosis: Dysphagia, unspecified (R13.10) Plan: Continue with current plan of care                     Corina Stacy P. Reakwon Barren, M.S.,  [place] Pathologist Acute Rehabilitation Services Pager: [place] 10/31/2019, 8:59 AM

## 2019-10-31 NOTE — NC FL2 (Signed)
[place] LEVEL OF CARE SCREENING TOOL     IDENTIFICATION  Patient Name: Chase Parsons Birthdate: 1955/10/09 Sex: male Admission Date (Current Location): 10/23/2019  [facility] and [place] Number:  Herbalist and Address:  [facility], [place] [address], [place], [place] [address]      Provider Number: [id number]  Attending Physician Name and Address:  Oda Kilts, MD  Relative Name and Phone Number:  Randall Hiss, brother [phone]    Current Level of Care: Hospital Recommended Level of Care: [place] Prior Approval Number:    Date Approved/Denied:   PASRR Number: Pending  Discharge Plan: SNF    Current Diagnoses: Patient Active Problem List   Diagnosis Date Noted  . Encephalopathy   . Falls frequently 10/24/2019  . Alcohol use disorder, moderate, dependence ([place]) 10/24/2019  . Alcohol withdrawal ([place]) 10/24/2019  . Hyponatremia 10/24/2019  . MDD (major depressive disorder), single episode, severe , no psychosis ([place]) 01/26/2015  . Severe single current episode of major depressive disorder, without psychotic features ([place]) 01/26/2015  . Suicidal thoughts     Orientation RESPIRATION BLADDER Height & Weight     Self  Normal Incontinent Weight: 145 lb (65.8 kg) Height:  5\' 9"  (175.3 cm)  BEHAVIORAL SYMPTOMS/MOOD NEUROLOGICAL BOWEL NUTRITION STATUS      Continent Diet (Please see DC Summary)  AMBULATORY STATUS COMMUNICATION OF NEEDS Skin   Extensive Assist Verbally Normal                       Personal Care Assistance Level of Assistance  Bathing, Dressing, Feeding Bathing Assistance: Maximum assistance Feeding assistance: Limited assistance Dressing Assistance: Limited assistance     Functional Limitations Info  Sight, Hearing, Speech Sight Info: Adequate Hearing Info: Adequate Speech Info: Adequate    SPECIAL CARE FACTORS FREQUENCY  PT (By licensed PT), OT (By licensed OT)     PT  Frequency: 5x/week OT Frequency: 5x/week            Contractures Contractures Info: Not present    Additional Factors Info  Code Status, Allergies Code Status Info: Full Allergies Info: NKA           Current Medications (10/31/2019):  This is the current hospital active medication list Current Facility-Administered Medications  Medication Dose Route Frequency Provider Last Rate Last Admin  . 0.9 %  sodium chloride infusion   Intravenous PRN Oda Kilts, MD 10 mL/hr at 10/30/19 0725 250 mL at 10/30/19 0725  . acetaminophen (TYLENOL) tablet 650 mg  650 mg Oral Q6H PRN Marianna Payment, MD   650 mg at 10/29/19 2993   Or  . acetaminophen (TYLENOL) suppository 650 mg  650 mg Rectal Q6H PRN Marianna Payment, MD   650 mg at 10/25/19 2232  . folic acid (FOLVITE) tablet 1 mg  1 mg Oral Daily Marianna Payment, MD   1 mg at 10/31/19 0941  . MEDLINE mouth rinse  15 mL Mouth Rinse BID Oda Kilts, MD   15 mL at 10/31/19 0949  . multivitamin with minerals tablet 1 tablet  1 tablet Oral Daily Marianna Payment, MD   1 tablet at 10/31/19 0941  . pantoprazole (PROTONIX) EC tablet 40 mg  40 mg Oral Daily Seawell, Jaimie A, DO   40 mg at 10/31/19 0941  . senna (SENOKOT) tablet 8.6 mg  1 tablet Oral BID Marianna Payment, MD   8.6 mg at 10/31/19 0941  .  thiamine (B-1) 250 mg in sodium chloride 0.9 % 50 mL IVPB  250 mg Intravenous Daily Andrew Au, MD 100 mL/hr at 10/31/19 0945 250 mg at 10/31/19 0945     Discharge Medications: Please see discharge summary for a list of discharge medications.  Relevant Imaging Results:  Relevant Lab Results:   Additional Information SSn: [place], [place]

## 2019-10-31 NOTE — Progress Notes (Addendum)
Subjective: Overnight Events:  [name] remained afebrile with a normal heart rate overnight. He had repeated instances of trying to get out of bed. Pt had no repeat events the rest of the night. He was evaluated by PT who recommend SNF placement as he is unstable on standing and with ambulation.  Mr Chase Parsons was examined at bedside this morning sitting upright in bed independently eating his breakfast. He appeared to have normal cognition though his speech was slow when responding to questions. He denies any nausea or vomiting. He states that he has continued headaches and abdominal pain but these are improving. He confirmed that he does not live with either his sister or his brother and, when asked, expressed interest in speaking with social work about possible solutions to his living situation. [name] was left comfortably in bed to eat his breakfast.  Objective: Vital signs in last 24 hours: Vitals:   10/30/19 2045 10/31/19 0125 10/31/19 0126 10/31/19 0400  BP: 113/66 121/81  122/70  Pulse:    84  Resp: (!) 22 17  20   Temp: 97.9 F (36.6 C)  98 F (36.7 C) (!) 97.5 F (36.4 C)  TempSrc: Oral  Oral Oral  SpO2:    95%  Weight:      Height:       Weight change:   Intake/Output Summary (Last 24 hours) at 10/31/2019 1137 Last data filed at 10/31/2019 1030 Gross per 24 hour  Intake 1775.46 ml  Output 1900 ml  Net -124.54 ml   Physical Exam Constitutional:      General: He is not in acute distress. Cardiovascular:     Rate and Rhythm: Normal rate and regular rhythm.     Pulses: Normal pulses.     Heart sounds: Normal heart sounds. No murmur heard.  No friction rub. No gallop.   Pulmonary:     Effort: Pulmonary effort is normal.     Breath sounds: Normal breath sounds.  Abdominal:     General: Abdomen is flat. Bowel sounds are normal. There is no distension.     Palpations: Abdomen is soft.     Tenderness: There is abdominal tenderness (generalized).    Musculoskeletal:     Right lower leg: No edema.     Left lower leg: No edema.  Skin:    General: Skin is warm and dry.  Neurological:     Mental Status: He is alert.    Lab Results: CBC Latest Ref Rng & Units 10/30/2019 10/27/2019 10/26/2019  WBC 4.0 - 10.5 K/uL 6.0 8.7 8.4  Hemoglobin 13.0 - 17.0 g/dL 9.7(L) 11.2(L) 10.8(L)  Hematocrit 39 - 52 % 29.2(L) 33.4(L) 31.6(L)  Platelets 150 - 400 K/uL 257 156 118(L)   CMP Latest Ref Rng & Units 10/31/2019 10/30/2019 10/29/2019  Glucose 70 - 99 mg/dL 88 89 91  BUN 8 - 23 mg/dL 6(L) <5(L) 6(L)  Creatinine 0.61 - 1.24 mg/dL 0.73 0.69 0.68  Sodium 135 - 145 mmol/L 135 131(L) 132(L)  Potassium 3.5 - 5.1 mmol/L 3.7 4.0 4.3  Chloride 98 - 111 mmol/L 100 97(L) 98  CO2 22 - 32 mmol/L 22 25 23   Calcium 8.9 - 10.3 mg/dL 9.0 8.7(L) 8.7(L)  Total Protein 6.5 - 8.1 g/dL 7.2 - -  Total Bilirubin 0.3 - 1.2 mg/dL 1.1 - -  Alkaline Phos 38 - 126 U/L 55 - -  AST 15 - 41 U/L 55(H) - -  ALT 0 - 44 U/L 31 - -  Studies/Results: MR BRAIN WO CONTRAST IMPRESSION: 1.  No acute intracranial abnormality. 2. Suspect generalized cerebral volume loss is advanced for age, but gray and white matter signal throughout the brain is largely normal for age. 3. 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 Electronically Signed   By: Genevie Ann M.D.   On: 10/30/2019 07:21   Assessment/Plan: Principal Problem:   Alcohol withdrawal ([place]) Active Problems:   Falls frequently   Alcohol use disorder, moderate, dependence (HCC)   Hyponatremia   Encephalopathy  [name] is a 64 year old male with a PMHx of homelessness, EtOH abuse, and MDD who presented with frequent falls and AMS on 10/23/19 and was admitted for alcohol withdrawal and hyponatremia.  Alcohol Withdrawal CIWA improving and no Ativan requirement >24 hrs. Mental status significantly improved, though he still responds slowly and softly. PO intake improved so will  discontinue LR infusion. Will have social work contact regarding resources for alcohol abuse. -Discontinue CIWA -Discontinue LR infusion -Final Librium 25 mg today -Continue Folate, and Multivitamin -Continue PPI -Continue to high dose thiamine: 500mg  TID today, then 250 mg daily for 5 days -Refer social work   Encephalopathy Cognition and neurologic status improving today. Afebrile since 7/1 and has not had tachycardia since 7/3. MRI brain 7/5 negative for acute process. Will continue to monitor for improvement. -continue high dose thiamine as above -monitor for signs of infection   Hypomagnesemia Hypophosphatemia Hypokalemia Electrolyte abnormalities improving likely in part due to improved PO food intake. Repleting as needed.  -Mg 1.7, Start Mag sulfate 2g -K 3.7 -Ph 3.7 -Repeat BMP, Ph, and Mg in AM   Hyponatremia Presented with hyponatremia, which improved with IV hydration to 130s. Na 135 today. Discontinue LR as PO intake has improved significantly with improvement in neuro and cognitive status. -Discontinue LR 37ml/hr   Abdominal Pain Likely 2/2 Gastritis Hepatic Steatosis 2/2 EtOH Use No pain at this time, though he states it comes intermittently. 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 Normal transaminases. -Continue PPI -Follow-up GI outpatient   Fever of Unknown Etiology Afebrile since 7/1. Had UA with nitrites, but culture <10,000 bacteria. Had 1 day of vanc, 3 of cefepime.  -Abx stopped -continue PRN Acetaminophen   Type 2 NSTEMI On 7/2 had an episode of tachycardia/ EKG with T-wave inversions that were present on previous day as well. Trop pf 130 initially, followed by 105. Most likely demand ischemia secondary to fever and withdrawal symptoms.  -monitor for additional episodes   Homelessness Conflicting information from patient and his sister regarding his living situation. Chase Parsons confirmed this morning that he  does not stay with her or his brother. When asked where he lives, he responded "nowhere". As his cognition has improved, will have social work stop by to provide resources. -will touch base with social work   Diet: regular IVF: none VTE: Lovenox Prior to Admission Living Arrangement: home Anticipated Discharge Location: SNF Barriers to Discharge: encephalopathic, electrolyte abnormalities Dispo: Anticipated discharge in approximately 1-3 day(s).   This is a Careers information officer Note.  The care of the patient was discussed with Dr. Eileen Stanford and the assessment and plan formulated with their assistance.  Please see their attached note for official documentation of the daily encounter.   LOS: 7 days   Calvert Cantor, Medical Student 10/31/2019, 11:37 AM

## 2019-10-31 NOTE — Progress Notes (Signed)
RE: Chase Parsons Date of Birth: 18-Jan-2056 Date: 10/31/19  Please be advised that the above-named patient will require a short-term nursing home stay - anticipated 30 days or less for rehabilitation and strengthening.  The plan is for return home.

## 2019-10-31 NOTE — Plan of Care (Signed)
  Problem: Education: Goal: Knowledge of General Education information will improve Description: Including pain rating scale, medication(s)/side effects and non-pharmacologic comfort measures Outcome: Progressing   Problem: Health Behavior/Discharge Planning: Goal: Ability to manage health-related needs will improve Outcome: Progressing   Problem: Activity: Goal: Risk for activity intolerance will decrease Outcome: Progressing   Problem: Nutrition: Goal: Adequate nutrition will be maintained Outcome: Progressing   Problem: Coping: Goal: Level of anxiety will decrease Outcome: Progressing   Problem: Safety: Goal: Ability to remain free from injury will improve Outcome: Not Progressing   

## 2019-11-01 LAB — BASIC METABOLIC PANEL
Anion gap: 13 (ref 5–15)
BUN: 5 mg/dL — ABNORMAL LOW (ref 8–23)
CO2: 23 mmol/L (ref 22–32)
Calcium: 9.2 mg/dL (ref 8.9–10.3)
Chloride: 102 mmol/L (ref 98–111)
Creatinine, Ser: 0.77 mg/dL (ref 0.61–1.24)
GFR calc Af Amer: >60 mL/min (ref >60)
GFR calc non Af Amer: >60 mL/min (ref >60)
Glucose, Bld: 97 mg/dL (ref 70–99)
Potassium: 3.4 mmol/L — ABNORMAL LOW (ref 3.5–5.1)
Sodium: 138 mmol/L (ref 135–145)

## 2019-11-01 LAB — CBC
HCT: 32.9 % — ABNORMAL LOW (ref 39.0–52.0)
Hemoglobin: 10.6 g/dL — ABNORMAL LOW (ref 13.0–17.0)
MCH: 33.2 pg (ref 26.0–34.0)
MCHC: 32.2 g/dL (ref 30.0–36.0)
MCV: 103.1 fL — ABNORMAL HIGH (ref 80.0–100.0)
Platelets: 408 10*3/uL — ABNORMAL HIGH (ref 150–400)
RBC: 3.19 MIL/uL — ABNORMAL LOW (ref 4.22–5.81)
RDW: 11.9 % (ref 11.5–15.5)
WBC: 6.3 10*3/uL (ref 4.0–10.5)
nRBC: 0 % (ref 0.0–0.2)

## 2019-11-01 LAB — MAGNESIUM: Magnesium: 1.2 mg/dL — ABNORMAL LOW (ref 1.7–2.4)

## 2019-11-01 LAB — PHOSPHORUS: Phosphorus: 3.6 mg/dL (ref 2.5–4.6)

## 2019-11-01 MED ORDER — POTASSIUM CHLORIDE 20 MEQ PO PACK
40.0000 meq | PACK | Freq: Two times a day (BID) | ORAL | Status: AC
  Administered 2019-11-01 (×2): 40 meq via ORAL
  Filled 2019-11-01 (×2): qty 2

## 2019-11-01 MED ORDER — MAGNESIUM SULFATE 4 GM/100ML IV SOLN
4.0000 g | Freq: Once | INTRAVENOUS | Status: AC
  Administered 2019-11-01: 4 g via INTRAVENOUS
  Filled 2019-11-01 (×2): qty 100

## 2019-11-01 NOTE — Progress Notes (Addendum)
Subjective:   Patient examined at bedside. He continues to be more interactive. He complains of heartburn and occasional chest pain. He states that he was falling often prior to arrival, he states that his legs give out. Unsure about numbness. He agrees to ask for help when he gets up. Patient eating breakfast when we left the room.  Objective:  Vital signs in last 24 hours: Vitals:   11/01/19 0259 11/01/19 0300 11/01/19 0301 11/01/19 0302  BP:  109/80    Pulse: 94 88 91   Resp: 16 20 19    Temp:  98.1 F (36.7 C)    TempSrc:  Oral    SpO2: 99% 100% 99%   Weight:    64.9 kg  Height:        Physical Exam Constitutional: no acute distress Head: atraumatic ENT: external ears normal Eyes: EOMI Cardiovascular: regular rate and rhythm, normal heart sounds Pulmonary: effort normal, lungs clear to ascultation bilaterally Abdominal: flat, nontender, no rebound tenderness, bowel sounds normal Skin: warm and dry Neurological: alert, no focal deficit, speaks slowly but improved from prior Psychiatric: normal mood and affect  Assessment/Plan: Danna Casella is a 64 y.o. male with hx of homelessness, EtOH abuse, and MDD who presented with frequent falls and AMS on 10/23/19 and was admitted for alcohol withdrawal and hyponatremia.  Principal Problem:   Alcohol withdrawal ([place]) Active Problems:   Falls frequently   Alcohol use disorder, moderate, dependence (HCC)   Hyponatremia   Encephalopathy   Alcohol Withdrawal CIWA protocol stopped. Mental status significantly improved, though he still responds slowly and softly. Attempts to get out of bed, requires redirection from nursing. -Ativan discontinued, Librium taper completed  -discontinue fluids since he has PO intake now -Continue Folate, and Multivitamin -continue to high dose thiamine: 500mg  TID for 2 days, then 250 daily for 5 days -Continue PPI   Encephalopathy Neurologic status improving, but lagging behind. DDx alcohol  withdrawal vs. Benzo administration vs Wernicke encephalopathy. Had fevers, but they have resolved and blood cultures were negative. MRI brain 7/5 without acute findings, cerebral volume loss which is advanced for age.  -continue high dose thiamine as above -monitor for signs of infection   Hypomagnesemia Hypophosphatemia Hypokalemia Electrolyte abnormalities improving. Repleting as needed.  -Mg 1.2, repleting -K 3.4, repleting -Ph 3.6   Anemia of chronic disease Hgb 10.6 today. MCV slightly elevated at times. B12 level normal. Supplemented B12 and folate. Fe 42, TIBC 237, ferritin 1620. Most likely anemia of chronic disease. -f/u methylmalonic acid -monitor   Abdominal Pain Likely 2/2 Gastritis Hepatic Steatosis 2/2 EtOH Use No pain at this time, though he states it comes intermittently. 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 Normal transaminases. -Continue PPI -Follow-up GI outpatient   Type 2 NSTEMI On 7/2 had an episode of tachycardia/ EKG with T-wave inversions that were present on previous day as well. Trop pf 130 initially, followed by 105. Most likely demand ischemia secondary to fever and withdrawal symptoms.  -monitor for additional episodes   Homelessness Social work following, but has been unable to assess due to mental status. Conflicting from patient and his sister. She states he has a home, but he states he is living on the streets. She does note long-standing history of heavy alcohol use. -will touch base with social work  Fever of Unknown Etiology - resolved Afebrile since 7/1. Had UA with nitrites, but culture <10,000 bacteria. Had 1 day of vanc, 3 of cefepime.  -Abx stopped -continue PRN  Acetaminophen  Hyponatremia - resolved Presented with hyponatremia, which improved with IV hydration to 130s. IV fluids stopped.   Diet: regular IVF:  VTE: Lovenox Prior to Admission Living Arrangement: home Anticipated Discharge  Location: SNF Barriers to Discharge: SNF placement Dispo: Anticipated discharge in approximately 1-3 day(s).  Andrew Au, MD 11/01/2019, 5:52 AM Pager: [phone] After 5pm on weekdays and 1pm on weekends: On Call pager [phone]

## 2019-11-01 NOTE — TOC Progression Note (Signed)
Transition of Care [facility]) - Progression Note    Patient Details  Name: Chase Parsons MRN: [id number] Date of Birth: 02-Nov-1955  Transition of Care [facility]) CM/SW [place], [place] Phone Number: 11/01/2019, 6:04 PM  Clinical Narrative:     Patient's PASRR/state approval  still pending  Thurmond Butts, MSW, LCSWA Clinical Social Worker        Expected Discharge Plan and Services                                                 Social Determinants of Health (SDOH) Interventions    Readmission Risk Interventions No flowsheet data found.

## 2019-11-01 NOTE — Hospital Course (Addendum)
Alcohol Withdrawal Patient with hx significant for homelessness and EtOH abuse presented to the ED with AMS, weakness, and frequent falls. CIWA scores were elevated, he was started on prn Ativan and librium. Patient required high doses of Ativan for approximately 6 days, during which CIWA scores gradually improved. He had a few episodes of tachycardia in the 160s, once with mild troponin elevation which was decreasing on repeat troponin. Most likely a type II MI. Though CIWA scores were improving, patient's mental status was comparatively lagging behind. MRI brain was unremarkable. Started on high dose thiamine. Mental status continued to improve after this. Wernicke encephalopathy vs alcohol withdrawal vs benzodiazepine induced. He had several electrolyte abnormalities which were repleted. At time of discharge, patient is alert and oriented but still moves slowly and speaks softly. Family states this is his baseline.   Anemia of chronic disease Patient presented with mild anemia. Iron studies consistent with anemia of chronic disease. Also had MCV with slight elevation. B12 levels normal, methylmalonic acid level normal. Most recent hgb 10.0  Hepatic steatosis, esophageal varices Reports intermittent abdominal pain. 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 AST 66, ALT 37. Pain improved with Protonics. Follow up with outpatient GI to evaluate varices.   Fever of Unknown Etiology, likely 2/2 alcohol withdrawal Had some fevers with max of 103.3, last on 7/1. Had UA with nitrites, but culture <10,000 bacteria. Had 1 day of vanc, 3 of cefepime. Blood cultures negative.

## 2019-11-02 LAB — MAGNESIUM: Magnesium: 1.5 mg/dL — ABNORMAL LOW (ref 1.7–2.4)

## 2019-11-02 LAB — BASIC METABOLIC PANEL
Anion gap: 11 (ref 5–15)
BUN: <5 mg/dL — ABNORMAL LOW (ref 8–23)
CO2: 22 mmol/L (ref 22–32)
Calcium: 9.3 mg/dL (ref 8.9–10.3)
Chloride: 104 mmol/L (ref 98–111)
Creatinine, Ser: 0.76 mg/dL (ref 0.61–1.24)
GFR calc Af Amer: >60 mL/min (ref >60)
GFR calc non Af Amer: >60 mL/min (ref >60)
Glucose, Bld: 89 mg/dL (ref 70–99)
Potassium: 3.9 mmol/L (ref 3.5–5.1)
Sodium: 137 mmol/L (ref 135–145)

## 2019-11-02 LAB — PHOSPHORUS: Phosphorus: 3.5 mg/dL (ref 2.5–4.6)

## 2019-11-02 MED ORDER — POTASSIUM CHLORIDE 20 MEQ PO PACK
40.0000 meq | PACK | Freq: Two times a day (BID) | ORAL | Status: AC
  Administered 2019-11-02 (×2): 40 meq via ORAL
  Filled 2019-11-02 (×2): qty 2

## 2019-11-02 MED ORDER — MAGNESIUM SULFATE 2 GM/50ML IV SOLN
2.0000 g | Freq: Once | INTRAVENOUS | Status: AC
  Administered 2019-11-02: 2 g via INTRAVENOUS
  Filled 2019-11-02: qty 50

## 2019-11-02 NOTE — Progress Notes (Addendum)
Subjective: Patient was sitting up on the bed and eating breakfast during examination. Patient reports feeling well. He states that it is fine to call his sister to update his situation. Attempted to call, but did not answer and voicemail inbox is full.  Objective:  Vital signs in last 24 hours: Vitals:   11/02/19 0035 11/02/19 0435 11/02/19 0436 11/02/19 0439  BP:  111/79    Pulse:      Resp: 16 16 17    Temp:   97.8 F (36.6 C)   TempSrc:   Oral   SpO2:      Weight:    64.8 kg  Height:        Physical Exam Constitutional: no acute distress Head: atraumatic ENT: external ears normal Eyes: EOMI Cardiovascular: regular rate and rhythm, normal heart sounds Pulmonary: effort normal, lungs clear to ascultation bilaterally Abdominal: flat, nontender, no rebound tenderness, bowel sounds normal Skin: warm and dry Neurological: alert but move slowly, no focal deficit Psychiatric: normal mood and affect  Assessment/Plan: Mr. Lachman is a 64 y/o M with hx of alcohol abuse and homelessness who presented for alcohol withdrawal, also had a likely type II MI during his withdrawals. He is much improved at this time and is pending SNF placement.  Principal Problem:   Alcohol withdrawal ([place]) Active Problems:   Falls frequently   Alcohol use disorder, moderate, dependence (HCC)   Hyponatremia   Encephalopathy  Alcohol Withdrawal CIWA protocol stopped. Mental status significantly improved, though he still moves relatively slowly. Attempts to get out of bed, requires redirection from nursing. -Ativan discontinued, Librium taper completed  -Continue Folate, and Multivitamin -continue high dose thiamine: 500mg  TID for 2 days, then 250 daily for 5 days (day 3/5) -Continue PPI -TOC for SNF placement   Encephalopathy Neurologic status much improved. DDx alcohol withdrawal vs. benzo administration vs Wernicke encephalopathy. Had fevers, but they have resolved and blood cultures were  negative. MRI brain 7/5 without acute findings, cerebral volume loss which is advanced for age.  -continue high dose thiamine as above -monitor for signs of infection   Hypomagnesemia Hypophosphatemia Hypokalemia Electrolyte abnormalities improving. Repleting as needed.  -Mg 1.5, repleting -K 3.9, repleting -Ph 3.6   Anemia of chronic disease Hgb 10.6 today. MCV slightly elevated at times. B12 level normal. Supplemented B12 and folate. Fe 42, TIBC 237, ferritin 1620. Most likely anemia of chronic disease. -f/u methylmalonic acid -monitor    Abdominal Pain Likely 2/2 Gastritis Hepatic Steatosis 2/2 EtOH Use No pain at this time, though he states it comes intermittently. 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 Normal transaminases. -Continue PPI -Follow-up GI outpatient   Type 2 NSTEMI On 7/2 had an episode of tachycardia/ EKG with T-wave inversions that were present on previous day as well. Trop pf 130 initially, followed by 105. Most likely demand ischemia secondary to fever and withdrawal symptoms.  -monitor for additional episodes   Homelessness Social work following. Conflicting information from patient and his sister.  -will touch base with social work   Fever of Unknown Etiology - resolved Afebrile since 7/1. Had UA with nitrites, but culture <10,000 bacteria. Had 1 day of vanc, 3 of cefepime.  -Abx stopped -continue PRN Acetaminophen   Diet: regular IVF:  VTE: Lovenox Prior to Admission Living Arrangement: home Anticipated Discharge Location: SNF Barriers to Discharge: SNF placement Dispo: Anticipated discharge as soon as SNF bed available  Andrew Au, MD 11/02/2019, 5:44 AM Pager: [phone] After 5pm on weekdays  and 1pm on weekends: On Call pager [phone]

## 2019-11-02 NOTE — Progress Notes (Signed)
Physical Therapy Treatment Patient Details Name: Chase Parsons MRN: [id number] DOB: 08/22/1955 Today's Date: 11/02/2019    History of Present Illness Pt is a 64 y.o. male admitted 10/23/19 with AMS, frequent falls, hyponatremia, ETOH withdrawal. Episode of tachycardia with T-wave inversions 7/2. Brain MRI 7/5 without acute findings; cerebral volume loss which is advanced for age. PMH includes ETOH abuse, major depressive disorder, homelessness.   PT Comments    Pt slowly progressing with mobility. Pt with improved orientation (name, [place], June 2020), but moving very slowly requiring significant increased time to complete tasks. Requires modA to prevent LOB with standing activity. Limited by generalized weakness, decreased activity tolerance and impaired cognition; at high risk for falls and hospital readmission. Continue to recommend SNF-level therapies to maximize functional mobility and independence.    Follow Up Recommendations  SNF;Supervision/Assistance - 24 hour     Equipment Recommendations  Rolling walker with 5" wheels    Recommendations for Other Services       Precautions / Restrictions Precautions Precautions: Fall;Other (comment) Precaution Comments: Quick to fatigue and buckle while standing Restrictions Weight Bearing Restrictions: No    Mobility  Bed Mobility Overal bed mobility: Needs Assistance Bed Mobility: Supine to Sit;Sit to Supine     Supine to sit: Supervision;HOB elevated Sit to supine: Supervision;HOB elevated   General bed mobility comments: Increased time and effort  Transfers Overall transfer level: Needs assistance Equipment used: Rolling walker (2 wheeled) Transfers: Sit to/from Stand Sit to Stand: Min assist         General transfer comment: Significant increased time to complete task; cue for hand placement; minA for stability; good eccentric control going to sit on lower bed surface, but cues for  sequencing  Ambulation/Gait Ambulation/Gait assistance: Min guard;Mod assist Gait Distance (Feet): 8 Feet Assistive device: Rolling walker (2 wheeled) Gait Pattern/deviations: Step-to pattern;Shuffle;Staggering right;Staggering left;Trunk flexed Gait velocity: Decreased Gait velocity interpretation: <1.31 ft/sec, indicative of household ambulator General Gait Details: Slow, mildly unsteady gait with RW, pt walked 4' forwards with RW then started to sit with bilateral knee instability, max cues for safety requiring chair brought up to prevent fall; walked back to bed with RW after this. Not increasing step length or gait speed despite cues   Stairs             Wheelchair Mobility    Modified Rankin (Stroke Patients Only)       Balance Overall balance assessment: Needs assistance Sitting-balance support: Feet supported;No upper extremity supported Sitting balance-Leahy Scale: Fair       Standing balance-Leahy Scale: Poor Standing balance comment: Reliant on UE support                            Cognition Arousal/Alertness: Awake/alert Behavior During Therapy: Flat affect Overall Cognitive Status: No family/caregiver present to determine baseline cognitive functioning Area of Impairment: Orientation;Attention;Following commands;Safety/judgement;Awareness;Problem solving                 Orientation Level: Disoriented to;Time;Situation Current Attention Level: Focused;Sustained   Following Commands: Follows one step commands with increased time;Follows one step commands inconsistently Safety/Judgement: Decreased awareness of safety;Decreased awareness of deficits Awareness: Intellectual Problem Solving: Slow processing;Decreased initiation;Difficulty sequencing;Requires verbal cues General Comments: Pt moving extremely slow - significantly increased time to answer questions, follow commands and complete tasks. Able to state name and at [facility],  initially stating June 2020, reoriented to current date.      Exercises  General Comments General comments (skin integrity, edema, etc.): HR 96-120 with activity; post-transfer BP 115/75, return to bed BP 110/73      Pertinent Vitals/Pain Pain Assessment: No/denies pain    Home Living                      Prior Function            PT Goals (current goals can now be found in the care plan section) Progress towards PT goals: Progressing toward goals    Frequency    Min 2X/week      PT Plan Current plan remains appropriate    Co-evaluation              AM-PAC PT "6 Clicks" Mobility   Outcome Measure  Help needed turning from your back to your side while in a flat bed without using bedrails?: None Help needed moving from lying on your back to sitting on the side of a flat bed without using bedrails?: A Little Help needed moving to and from a bed to a chair (including a wheelchair)?: A Little Help needed standing up from a chair using your arms (e.g., wheelchair or bedside chair)?: A Little Help needed to walk in hospital room?: A Lot Help needed climbing 3-5 steps with a railing? : Total 6 Click Score: 16    End of Session Equipment Utilized During Treatment: Gait belt Activity Tolerance: Patient tolerated treatment well;Patient limited by fatigue Patient left: in bed;with call bell/phone within reach;with bed alarm set Nurse Communication: Mobility status PT Visit Diagnosis: Unsteadiness on feet (R26.81);History of falling (Z91.81);Muscle weakness (generalized) (M62.81)     Time: 7616-0737 PT Time Calculation (min) (ACUTE ONLY): 24 min  Charges:  $Gait Training: 8-22 mins $Therapeutic Activity: 8-22 mins                    Mabeline Caras, PT, DPT Acute Rehabilitation Services  Pager [phone] Office [place] 11/02/2019, 3:35 PM

## 2019-11-02 NOTE — TOC Progression Note (Signed)
Transition of Care [facility] - Progression Note    Patient Details  Name: Chase Parsons MRN: [id number] Date of Birth: 09/24/1955  Transition of [facility] CM/SW [place], [place] Phone Number: 11/02/2019, 3:09 PM  Clinical Narrative:     Patient has no bed offers and will be difficult to place due to ETOH abuse.  PASRR still pending.  Thurmond Butts, MSW, [place] Clinical Social Worker        Expected Discharge Plan and Services                                                 Social Determinants of Health (SDOH) Interventions    Readmission Risk Interventions No flowsheet data found.

## 2019-11-03 LAB — BASIC METABOLIC PANEL
Anion gap: 11 (ref 5–15)
BUN: 7 mg/dL — ABNORMAL LOW (ref 8–23)
CO2: 23 mmol/L (ref 22–32)
Calcium: 9.5 mg/dL (ref 8.9–10.3)
Chloride: 105 mmol/L (ref 98–111)
Creatinine, Ser: 0.76 mg/dL (ref 0.61–1.24)
GFR calc Af Amer: >60 mL/min (ref >60)
GFR calc non Af Amer: >60 mL/min (ref >60)
Glucose, Bld: 91 mg/dL (ref 70–99)
Potassium: 4.1 mmol/L (ref 3.5–5.1)
Sodium: 139 mmol/L (ref 135–145)

## 2019-11-03 LAB — PHOSPHORUS: Phosphorus: 3.6 mg/dL (ref 2.5–4.6)

## 2019-11-03 LAB — MAGNESIUM: Magnesium: 1.4 mg/dL — ABNORMAL LOW (ref 1.7–2.4)

## 2019-11-03 MED ORDER — MAGNESIUM SULFATE 2 GM/50ML IV SOLN
2.0000 g | Freq: Once | INTRAVENOUS | Status: AC
  Administered 2019-11-03: 2 g via INTRAVENOUS
  Filled 2019-11-03: qty 50

## 2019-11-03 NOTE — Evaluation (Signed)
Occupational Therapy Evaluation Patient Details Name: Chase Parsons MRN: [id number] DOB: [birth year] Today's Date: 11/03/2019    History of Present Illness Pt is a 64 y.o. male admitted 10/23/19 with AMS, frequent falls, hyponatremia, ETOH withdrawal. Episode of tachycardia with T-wave inversions 7/2. Brain MRI 7/5 without acute findings; cerebral volume loss which is advanced for age. PMH includes ETOH abuse, major depressive disorder, homelessness.   Clinical Impression   Patient currently presents below baseline level of function requiring set-up assist grossly for UB BADLs and Min A grossly for LB BADLs. Patient also limited by decreased cognition, decreased static/dynamic standing balance, decreased safety awareness, and increased processing time. Patient would benefit from continued acute OT services to maximize safety and independence with self-care tasks, functional mobility, and transfers. Recommendation for SNF rehab in prep for return to PLOF.     Follow Up Recommendations  SNF    Equipment Recommendations  Other (comment) (Defer to next level of care. )    Recommendations for Other Services       Precautions / Restrictions Precautions Precautions: Fall;Other (comment) Precaution Comments: Quick to fatigue and buckle while standing Restrictions Weight Bearing Restrictions: No      Mobility Bed Mobility Overal bed mobility: Needs Assistance Bed Mobility: Supine to Sit;Sit to Supine     Supine to sit: Supervision;HOB elevated     General bed mobility comments: Increased time and effort  Transfers Overall transfer level: Needs assistance Equipment used: Rolling walker (2 wheeled) Transfers: Sit to/from [facility] Sit to Stand: Min assist;From elevated surface Stand pivot transfers: Min assist       General transfer comment: STS from elevated EOB with Min A and from [facility] with Min A to RW. SPT to recliner with Min A and cues for hand placement  and proximity to chair.     Balance Overall balance assessment: Needs assistance Sitting-balance support: Feet supported;No upper extremity supported Sitting balance-Leahy Scale: Fair       Standing balance-Leahy Scale: Poor Standing balance comment: Reliant on UE support                           ADL either performed or assessed with clinical judgement   ADL Overall ADL's : Needs assistance/impaired         Upper Body Bathing: Set up;Sitting Upper Body Bathing Details (indicate cue type and reason): With increased time and cues for sequencing, patient able to wash BUE and chest seated on BSC with set-up assist.  Lower Body Bathing: Minimal assistance;Sit to/from stand Lower Body Bathing Details (indicate cue type and reason): Patient able to wash front perineal area, upper/lower legs and feet with assist to for thoroughness to wash buttocks in standing.  Upper Body Dressing : Set up;Sitting Upper Body Dressing Details (indicate cue type and reason): To don anterior hospital gown seated on BSC.  Lower Body Dressing: Minimal assistance Lower Body Dressing Details (indicate cue type and reason): To don footwear seated EOB.  Toilet Transfer: Minimal Interior and spatial designer Details (indicate cue type and reason): Cues for hand placement, proximity and sequencing.  Toileting- Clothing Manipulation and Hygiene: Minimal assistance;Sit to/from stand Toileting - Clothing Manipulation Details (indicate cue type and reason): Min A for hygiene/clothing management       General ADL Comments: Patient continent of bowel seated on BSC. Increased time/effort to complete tasks this session.      Vision Baseline Vision/History: No visual deficits Patient Visual Report: No change from  baseline Vision Assessment?: No apparent visual deficits     Perception     Praxis      Pertinent Vitals/Pain Pain Assessment: 0-10 Pain Score: 2  Pain Location: IV site; RN  notified Pain Descriptors / Indicators: Sore Pain Intervention(s): Other (comment) (RN notified)     Hand Dominance Right   Extremity/Trunk Assessment Upper Extremity Assessment Upper Extremity Assessment: Generalized weakness   Lower Extremity Assessment Lower Extremity Assessment: Defer to PT evaluation   Cervical / Trunk Assessment Cervical / Trunk Assessment: Normal   Communication Communication Communication: No difficulties   Cognition Arousal/Alertness: Awake/alert Behavior During Therapy: Flat affect Overall Cognitive Status: No family/caregiver present to determine baseline cognitive functioning Area of Impairment: Orientation;Attention;Following commands;Safety/judgement;Awareness;Problem solving                 Orientation Level: Disoriented to;Time Current Attention Level: Focused;Sustained   Following Commands: Follows one step commands with increased time;Follows one step commands inconsistently Safety/Judgement: Decreased awareness of safety;Decreased awareness of deficits Awareness: Intellectual Problem Solving: Slow processing;Decreased initiation;Difficulty sequencing;Requires verbal cues General Comments: Patient able to follow 1-2 step verbal commands with increased accuracy this date. Continued increased time to process verbal information.    General Comments       Exercises     Shoulder Instructions      Home Living Family/patient expects to be discharged to:: Skilled nursing facility                             Home Equipment: None   Additional Comments: Pt repors homeless and has nothing, per chart sister reports pt has a home.      Prior Functioning/Environment          Comments: Pt unable to reports PLOF.  Reports several recent falls and expresses fear of falling.        OT Problem List: Decreased strength;Decreased activity tolerance;Impaired balance (sitting and/or standing);Decreased cognition;Decreased  safety awareness;Decreased knowledge of use of DME or AE;Decreased knowledge of precautions      OT Treatment/Interventions: Self-care/ADL training;Therapeutic exercise;Energy conservation;DME and/or AE instruction;Therapeutic activities;Cognitive remediation/compensation;Patient/family education;Balance training    OT Goals(Current goals can be found in the care plan section) Acute Rehab OT Goals Patient Stated Goal: To get stronger.  OT Goal Formulation: With patient Time For Goal Achievement: 11/17/19 Potential to Achieve Goals: Good ADL Goals Pt Will Perform Grooming: with modified independence;standing Pt Will Perform Upper Body Bathing: sitting;Independently Pt Will Perform Lower Body Bathing: with modified independence;sitting/lateral leans Pt Will Perform Upper Body Dressing: sitting;Independently Pt Will Perform Lower Body Dressing: with modified independence;with adaptive equipment;sit to/from stand Pt Will Transfer to Toilet: with modified independence;ambulating Pt Will Perform Toileting - Clothing Manipulation and hygiene: with modified independence;sit to/from stand Pt/caregiver will Perform Home Exercise Program: Increased strength;Both right and left upper extremity;Independently  OT Frequency: Min 2X/week   Barriers to D/C:            Co-evaluation              AM-PAC OT "6 Clicks" Daily Activity     Outcome Measure Help from another person eating meals?: A Little Help from another person taking care of personal grooming?: A Little Help from another person toileting, which includes using toliet, bedpan, or urinal?: A Little Help from another person bathing (including washing, rinsing, drying)?: A Little Help from another person to put on and taking off regular upper body clothing?: A Little Help from another  person to put on and taking off regular lower body clothing?: A Little 6 Click Score: 18   End of Session Equipment Utilized During Treatment: Gait  belt;Rolling walker Nurse Communication: Mobility status  Activity Tolerance: Patient tolerated treatment well Patient left: in chair;with call bell/phone within reach;with chair alarm set  OT Visit Diagnosis: Unsteadiness on feet (R26.81);Repeated falls (R29.6);Muscle weakness (generalized) (M62.81)                Time: 5750-5183 OT Time Calculation (min): 40 min Charges:  OT General Charges $OT Visit: 1 Visit OT Evaluation $OT Eval Moderate Complexity: 1 Mod OT Treatments $Self Care/Home Management : 23-37 mins  Charma Mocarski H. OTR/L Supplemental OT, Department of rehab services [phone]  Asaad Gulley R H. 11/03/2019, 12:51 PM

## 2019-11-03 NOTE — Progress Notes (Signed)
  Speech Language Pathology Treatment: Dysphagia  Patient Details Name: Chase Parsons MRN: [id number] DOB: [birth year] Today's Date: 11/03/2019 Time: 9480-1655 SLP Time Calculation (min) (ACUTE ONLY): 14 min  Assessment / Plan / Recommendation Clinical Impression  Pt was seen for dysphagia treatment and was cooperative throughout the session. Pt's sister and brother-in-law were present at bedside and reported that the pt has always been a very slow eater. Pt and nursing reported that the pt has been tolerating the current diet without overt s/sx of aspiration. Pt tolerated regular texture solids and thin liquids via straw using consecutive swallows without symptoms of oropharyngeal dysphagia. Mastication time was increased and rate of intake was reduced but pt's sister stated that this is his baseline. Pt's diet will be advanced to regular texture solids and thin liquids. Further skilled SLP services are not clinically indicated at this time.    HPI HPI: Pt is a 64 yo male presenting with weakness and frequent falls x1 week. He also reported N/V and odynophagia. He is admitted with acute alcohol withdrawal; under CIWA protocol. CXR, CT Head, and CT Abdomen clear on admission. PMH includes: PNA, alcohol abuse. MRI brain 7/5: No acute intracranial abnormality. CXR negative for acute changes.       SLP Plan  All goals met;Discharge SLP treatment due to (comment)       Recommendations  Diet recommendations: Regular;Thin liquid Liquids provided via: Cup;Straw Medication Administration: Whole meds with puree Supervision: Patient able to self feed Compensations: Minimize environmental distractions;Slow rate;Small sips/bites Postural Changes and/or Swallow Maneuvers: Seated upright 90 degrees                Oral Care Recommendations: Oral care BID Follow up Recommendations: None SLP Visit Diagnosis: Dysphagia, unspecified (R13.10) Plan: All goals met;Discharge SLP treatment due to  (comment)       Jakari Jacot I. Hardin Negus, [place], [place] Office number [phone] Pager [place] 11/03/2019, 5:28 PM

## 2019-11-03 NOTE — Progress Notes (Signed)
   Subjective:   Patient examined at bedside. He denies any complaints at this time. Eating breakfast. He notes a headache last night that resolved, and continued intermittent abdominal pain.  Objective:  Vital signs in last 24 hours: Vitals:   11/03/19 0117 11/03/19 0118 11/03/19 0510 11/03/19 0517  BP: 109/69  110/78   Pulse:   96   Resp: 19  18   Temp:  98.7 F (37.1 C) 97.7 F (36.5 C)   TempSrc:  Oral Oral   SpO2:   100%   Weight:    63.5 kg  Height:        Physical Exam Constitutional: no acute distress Head: atraumatic ENT: external ears normal Eyes: EOMI Cardiovascular: regular rate and rhythm, normal heart sounds Pulmonary: effort normal, lungs clear to ascultation bilaterally Abdominal: flat, nontender, no rebound tenderness, bowel sounds normal Skin: warm and dry Neurological: alert, moves slowly, no focal deficit Psychiatric: normal mood and affect  Assessment/Plan: Chase Parsons is a 64 y.o. male with hx of alcohol abuse and homelessness who presented for alcohol withdrawal, also had a likely type II MI during his withdrawals. He is much improved at this time and is pending SNF placement.  Principal Problem:   Alcohol withdrawal ([place]) Active Problems:   Falls frequently   Alcohol use disorder, moderate, dependence (HCC)   Hyponatremia   Encephalopathy  Alcohol Withdrawal CIWA protocol stopped. Mental statussignificantly improved, though he still moves relatively slowly.Attempts to get out of bed, requires redirection from nursing. -Ativan and Librium discontinued -Continue Folate, and Multivitamin -continuehigh dosethiamine: 500mg  TID for 2 days, then 250 daily for 5 days (day 4/5) -Continue PPI -TOC for SNF placement, difficulty due to alcohol use  Encephalopathy Neurologic status much improved. DDx alcohol withdrawal vs. benzo administration vs Wernicke encephalopathy. Had fevers, but they have resolved and blood cultures were negative.MRI  brain 7/5 without acute findings, cerebral volume loss which is advanced for age. -continuehigh dose thiamine as above -monitor for signs of infection  Hypomagnesemia Hypophosphatemia Hypokalemia Electrolyte abnormalities improving. Repleting as needed. -Mg1.5, repleting -K3.9, repleting -Ph3.5  Anemia of chronic disease Hgb 10.6 most recently, on 7/7. MCV slightly elevated at times. B12 level normal. SupplementedB12 and folate. Fe 42, TIBC 237, ferritin 1620. Most likely anemia of chronic disease. -f/u methylmalonic acid level -monitor  Abdominal PainLikely 2/2 Gastritis Hepatic Steatosis 2/2 EtOH Use No pain at this time, though he states it comes intermittently. 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 Normal transaminases. -Continue PPI -Follow-up GI outpatient  Type 2 NSTEMI On 7/2 had an episode of tachycardia/ EKG with T-wave inversions that were present on previous day as well. Trop pf 130 initially, followed by 105. Most likely demand ischemia secondary to fever and withdrawal symptoms.  -monitor for additional episodes  Homelessness Social work following.Conflicting information from patient and his sister.  -will touch base with social work  Fever of Unknown Etiology- resolved Afebrile since 7/1. Had UA with nitrites, but culture <10,000 bacteria. Had 1 day of vanc, 3 of cefepime. -Abxstopped -continuePRN Acetaminophen  Diet:regular IVF: TFT:DDUKGUR Prior to Admission Living Arrangement:home Anticipated Discharge Location:SNF Barriers to Discharge:SNF placement Dispo: Anticipated discharge as soon as SNF bed available  Andrew Au, MD 11/03/2019, 6:15 AM Pager: [phone] After 5pm on weekdays and 1pm on weekends: On Call pager [phone]

## 2019-11-03 NOTE — Plan of Care (Signed)
  Problem: Nutrition: Goal: Adequate nutrition will be maintained 11/03/2019 1594 by Ruben Im, RN Outcome: Completed/Met 11/03/2019 0329 by Ruben Im, RN Outcome: Progressing   Problem: Pain Managment: Goal: General experience of comfort will improve 11/03/2019 0632 by Ruben Im, RN Outcome: Completed/Met 11/03/2019 0329 by Ruben Im, RN Outcome: Progressing   Problem: Skin Integrity: Goal: Risk for impaired skin integrity will decrease 11/03/2019 0632 by Ruben Im, RN Outcome: Completed/Met 11/03/2019 0329 by Ruben Im, RN Outcome: Progressing

## 2019-11-04 LAB — PHOSPHORUS: Phosphorus: 4.5 mg/dL (ref 2.5–4.6)

## 2019-11-04 LAB — BASIC METABOLIC PANEL
Anion gap: 11 (ref 5–15)
BUN: 7 mg/dL — ABNORMAL LOW (ref 8–23)
CO2: 23 mmol/L (ref 22–32)
Calcium: 9.4 mg/dL (ref 8.9–10.3)
Chloride: 107 mmol/L (ref 98–111)
Creatinine, Ser: 0.72 mg/dL (ref 0.61–1.24)
GFR calc Af Amer: >60 mL/min (ref >60)
GFR calc non Af Amer: >60 mL/min (ref >60)
Glucose, Bld: 91 mg/dL (ref 70–99)
Potassium: 4.1 mmol/L (ref 3.5–5.1)
Sodium: 141 mmol/L (ref 135–145)

## 2019-11-04 LAB — MAGNESIUM: Magnesium: 1.2 mg/dL — ABNORMAL LOW (ref 1.7–2.4)

## 2019-11-04 MED ORDER — MAGNESIUM SULFATE 2 GM/50ML IV SOLN
2.0000 g | Freq: Once | INTRAVENOUS | Status: AC
  Administered 2019-11-04: 2 g via INTRAVENOUS
  Filled 2019-11-04: qty 50

## 2019-11-04 NOTE — Progress Notes (Signed)
   Subjective:   Patient examined at bedside. He denies any complaints at this time. Asked if he wanted to finish breakfast, but said he'd rather watch the news.   Objective:  Vital signs in last 24 hours: Vitals:   11/04/19 0030 11/04/19 0340 11/04/19 0347 11/04/19 1205  BP:  113/82  114/75  Pulse:    94  Resp:  18    Temp: 98.3 F (36.8 C) 98.3 F (36.8 C)  98.2 F (36.8 C)  TempSrc: Oral Oral  Oral  SpO2:    96%  Weight:   64.5 kg   Height:        Physical Exam Constitutional: no acute distress Head: atraumatic ENT: external ears normal Eyes: EOMI Cardiovascular: regular rate and rhythm, normal heart sounds Pulmonary: effort normal, lungs clear to ascultation bilaterally Abdominal: flat, nontender, no rebound tenderness, bowel sounds normal Skin: warm and dry Neurological: alert, moves slowly, hypophonic speech, no focal deficit Psychiatric: normal mood and affect  Assessment/Plan: Cleland Simkins is a 64 y.o. male with hx of alcohol abuse and homelessness who presented for alcohol withdrawal, also had a likely type II NSTEMI during his withdrawals. He is much improved at this time and is pending SNF placement.  Principal Problem:   Alcohol withdrawal ([place]) Active Problems:   Falls frequently   Alcohol use disorder, moderate, dependence (HCC)   Hyponatremia   Encephalopathy  Alcohol Withdrawal Mental statussignificantly improved, though he still moves relatively slowly. Off of librium, ativan, CIWA protocol. -continuehigh dosethiamine: 500mg  TID for 2 days, then 250 daily for 5 days (day 5/5) -Continue PPI -TOC for SNF placement, difficulty due to alcohol use  Hypomagnesemia Hypophosphatemia Hypokalemia Electrolyte abnormalities improving. Repleting as needed. -Continue folate and MVI -Mg1.2, repleting -K4.1 -Ph4.5  Anemia of chronic disease Hgb 10.6 most recently, on 7/7. MCV slightly elevated at times. B12 level normal. SupplementedB12 and  folate. Fe 42, TIBC 237, ferritin 1620. Most likely anemia of chronic disease. -f/u methylmalonic acid level  Homelessness Social work following.  Diet:regular XMI:WOEH needed OZY:YQMGNOI Prior to Admission Living Arrangement:home Anticipated Discharge Location:SNF Barriers to Discharge:SNF placement Dispo: Anticipated discharge as soon as SNF bed available  Oda Kilts, MD 11/04/2019, 4:28 PM

## 2019-11-05 LAB — CBC
HCT: 31 % — ABNORMAL LOW (ref 39.0–52.0)
Hemoglobin: 10 g/dL — ABNORMAL LOW (ref 13.0–17.0)
MCH: 33.3 pg (ref 26.0–34.0)
MCHC: 32.3 g/dL (ref 30.0–36.0)
MCV: 103.3 fL — ABNORMAL HIGH (ref 80.0–100.0)
Platelets: 444 10*3/uL — ABNORMAL HIGH (ref 150–400)
RBC: 3 MIL/uL — ABNORMAL LOW (ref 4.22–5.81)
RDW: 12.2 % (ref 11.5–15.5)
WBC: 7.8 10*3/uL (ref 4.0–10.5)
nRBC: 0 % (ref 0.0–0.2)

## 2019-11-05 LAB — BASIC METABOLIC PANEL
Anion gap: 11 (ref 5–15)
BUN: 5 mg/dL — ABNORMAL LOW (ref 8–23)
CO2: 23 mmol/L (ref 22–32)
Calcium: 9.4 mg/dL (ref 8.9–10.3)
Chloride: 106 mmol/L (ref 98–111)
Creatinine, Ser: 0.71 mg/dL (ref 0.61–1.24)
GFR calc Af Amer: >60 mL/min (ref >60)
GFR calc non Af Amer: >60 mL/min (ref >60)
Glucose, Bld: 88 mg/dL (ref 70–99)
Potassium: 3.5 mmol/L (ref 3.5–5.1)
Sodium: 140 mmol/L (ref 135–145)

## 2019-11-05 LAB — MAGNESIUM: Magnesium: 1.2 mg/dL — ABNORMAL LOW (ref 1.7–2.4)

## 2019-11-05 LAB — PHOSPHORUS: Phosphorus: 4 mg/dL (ref 2.5–4.6)

## 2019-11-05 LAB — METHYLMALONIC ACID, SERUM: Methylmalonic Acid, Quantitative: 205 nmol/L (ref 0–378)

## 2019-11-05 MED ORDER — MAGNESIUM SULFATE 4 GM/100ML IV SOLN
4.0000 g | Freq: Once | INTRAVENOUS | Status: AC
  Administered 2019-11-05: 4 g via INTRAVENOUS
  Filled 2019-11-05: qty 100

## 2019-11-05 MED ORDER — THIAMINE HCL 100 MG PO TABS
100.0000 mg | ORAL_TABLET | Freq: Every day | ORAL | Status: DC
  Administered 2019-11-05 – 2019-11-07 (×3): 100 mg via ORAL
  Filled 2019-11-05 (×3): qty 1

## 2019-11-05 NOTE — TOC Initial Note (Signed)
Transition of Care [facility]) - Initial/Assessment Note    Patient Details  Name: Chase Parsons MRN: [id number] Date of Birth: 1955-09-20  [facility] CM/SW Contact:    Bary Castilla, LCSW Phone Number: [phone] 11/05/2019, 4:28 PM  Clinical Narrative:                  CSW met with patient to discuss his discharge plan. CSW explained to patient that he does not have bed offers. Patient explained that his sister Gwyndolyn Saxon said that he could come stay with her. Patient gave CSW permission to speak with sister.CSW called  Evangelene and she explained that patient chooses to be homeless because he will not listen. Evangelene stated that he could come stay with her and her husband. CSW explained that as of yet patient does not have any bed offers.   TOC team will continue to assist with disharge planning needs.     Expected Discharge Plan: Skilled Nursing Facility Barriers to Discharge: SNF Pending bed offer, Continued Medical Work up   Patient Goals and CMS Choice        Expected Discharge Plan and Services Expected Discharge Plan: [place] arrangements for the past 2 months: Homeless                                      Prior Living Arrangements/Services Living arrangements for the past 2 months: Homeless Lives with:: Self Patient language and need for interpreter reviewed:: Yes          Care giver support system in place?: Yes (comment)      Activities of Daily Living      Permission Sought/Granted Permission sought to share information with : Family Supports Permission granted to share information with : Yes, Verbal Permission Granted  Share Information with NAME: Evangelene     Permission granted to share info w Relationship: Sister  Permission granted to share info w Contact Information: [phone]  Emotional Assessment Appearance:: Appears older than stated age Attitude/Demeanor/Rapport:  Engaged Affect (typically observed): Accepting Orientation: : Oriented to Self, Oriented to Place, Oriented to  Time, Oriented to Situation, Fluctuating Orientation (Suspected and/or reported Sundowners) Alcohol / Substance Use: Alcohol Use    Admission diagnosis:  Alcohol withdrawal ([place]) [F10.239] Dehydration [E86.0] Hyponatremia [E87.1] Falls frequently [R29.6] High anion gap metabolic acidosis [T46.5] Fever, unspecified fever cause [R50.9] Patient Active Problem List   Diagnosis Date Noted  . Encephalopathy   . Falls frequently 10/24/2019  . Alcohol use disorder, moderate, dependence ([place]) 10/24/2019  . Alcohol withdrawal ([place]) 10/24/2019  . Hyponatremia 10/24/2019  . MDD (major depressive disorder), single episode, severe , no psychosis ([place]) 01/26/2015  . Severe single current episode of major depressive disorder, without psychotic features ([place]) 01/26/2015  . Suicidal thoughts    PCP:  Placey, Audrea Muscat, NP Pharmacy:  No Pharmacies Listed    Social Determinants of Health (SDOH) Interventions    Readmission Risk Interventions No flowsheet data found.

## 2019-11-05 NOTE — Progress Notes (Addendum)
° °  Subjective:   Chase Parsons reports that he is doing well today. He did complain of chest discomfort earlier but that has resolved. He has had ongoing conversation with his sister who is wiling to help him after he is discharged from the hospital.    Objective:  Vital signs in last 24 hours: Vitals:   11/05/19 0527 11/05/19 0528 11/05/19 0529 11/05/19 0535  BP:      Pulse:   93   Resp: 18  14   Temp:  97.7 F (36.5 C)    TempSrc:  Oral    SpO2:   97%   Weight:    64.9 kg  Height:        Physical Exam Constitutional: no acute distress Head: atraumatic ENT: external ears normal Eyes: EOMI Cardiovascular: regular rate and rhythm, normal heart sounds Pulmonary: effort normal, lungs clear to ascultation bilaterally Abdominal: flat, nontender, no rebound tenderness, bowel sounds normal Skin: warm and dry Neurological: alert, moves slowly, hypophonic speech, no focal deficit Psychiatric: normal mood and affect  Assessment/Plan: Chase Parsons is a 64 y.o. male with hx of alcohol abuse and homelessness who presented for alcohol withdrawal, also had a likely type II NSTEMI during his withdrawals. He is much improved at this time and is pending SNF placement.  Principal Problem:   Alcohol withdrawal ([place]) Active Problems:   Falls frequently   Alcohol use disorder, moderate, dependence (HCC)   Hyponatremia   Encephalopathy  Alcohol Withdrawal Mental statussignificantly improved, though hestill moves relatively slowly. Off of librium, ativan, CIWA protocol. -5 days of IV high dose thiamine completed, switch to PO thiamine -Continue PPI -TOC for SNF placement, difficulty due to alcohol use  Hypomagnesemia Hypophosphatemia Hypokalemia Electrolyte abnormalities improving. Repleting as needed. -Continue folate and MVI  -Mg1.2, repleting -K3.5 -Ph4.0  Anemia of chronic disease Hgb 10.6 most recently, on 7/7. MCV slightly elevated at times. B12 level normal.  SupplementedB12 and folate. Fe 42, TIBC 237, ferritin 1620. Most likely anemia of chronic disease. -f/u methylmalonic acid level  Homelessness Social work following.  Diet:regular TAV:WPVX needed YIA:XKPVVZS Prior to Admission Living Arrangement:home Anticipated Discharge Location:SNF Barriers to Discharge:SNF placement Dispo: Anticipated dischargeas soon as SNF bed available  Andrew Au, MD 11/05/2019, 5:51 AM Pager: [phone] After 5pm on weekdays and 1pm on weekends: On Call pager [phone]

## 2019-11-06 LAB — BASIC METABOLIC PANEL
Anion gap: 10 (ref 5–15)
BUN: 7 mg/dL — ABNORMAL LOW (ref 8–23)
CO2: 25 mmol/L (ref 22–32)
Calcium: 9.3 mg/dL (ref 8.9–10.3)
Chloride: 105 mmol/L (ref 98–111)
Creatinine, Ser: 0.89 mg/dL (ref 0.61–1.24)
GFR calc Af Amer: >60 mL/min (ref >60)
GFR calc non Af Amer: >60 mL/min (ref >60)
Glucose, Bld: 89 mg/dL (ref 70–99)
Potassium: 3.6 mmol/L (ref 3.5–5.1)
Sodium: 140 mmol/L (ref 135–145)

## 2019-11-06 LAB — MAGNESIUM: Magnesium: 1.3 mg/dL — ABNORMAL LOW (ref 1.7–2.4)

## 2019-11-06 LAB — PHOSPHORUS: Phosphorus: 4.3 mg/dL (ref 2.5–4.6)

## 2019-11-06 MED ORDER — MAGNESIUM SULFATE 4 GM/100ML IV SOLN
4.0000 g | Freq: Once | INTRAVENOUS | Status: AC
  Administered 2019-11-06: 4 g via INTRAVENOUS
  Filled 2019-11-06: qty 100

## 2019-11-06 NOTE — Progress Notes (Signed)
Physical Therapy Treatment Patient Details Name: Chase Parsons MRN: [id number] DOB: 1955/09/07 Today's Date: 11/06/2019    History of Present Illness Pt is a 64 y.o. male admitted 10/23/19 with AMS, frequent falls, hyponatremia, ETOH withdrawal. Episode of tachycardia with T-wave inversions 7/2. Brain MRI 7/5 without acute findings; cerebral volume loss which is advanced for age. PMH includes ETOH abuse, major depressive disorder, homelessness.    PT Comments    Pt admitted with above diagnosis. Pt was able to ambulate with RW with min guard assist and min cues for safety.  Pt incr distance and was more steady than when last seen. If pt has 24 hour care and agrees to go with sister, feel that it would be appropriate.  Unsure if HHPT will follow pt at home but pt would benefit.  SNF if pt refuses to go stay with sister.   Pt currently with functional limitations due to balance and endurance deficits. Updated frequency and d/c plan.   Pt will benefit from skilled PT to increase their independence and safety with mobility to allow discharge to the venue listed below.     Follow Up Recommendations  Home health PT;Supervision/Assistance - 24 hour (SNF if he refuses to go home with sister)     Equipment Recommendations  Rolling walker with 5" wheels;3in1 (PT)    Recommendations for Other Services       Precautions / Restrictions Precautions Precautions: Fall;Other (comment) Precaution Comments: Quick to fatigue with ambulation/standing Restrictions Weight Bearing Restrictions: No    Mobility  Bed Mobility Overal bed mobility: Needs Assistance Bed Mobility: Supine to Sit;Sit to Supine     Supine to sit: Supervision;HOB elevated Sit to supine: Supervision;HOB elevated   General bed mobility comments: Increased time and effort  Transfers Overall transfer level: Needs assistance Equipment used: Rolling walker (2 wheeled) Transfers: Sit to/from [facility] Sit to  Stand: Min guard         General transfer comment: Pt needed cues for hand placement only  Ambulation/Gait Ambulation/Gait assistance: Min guard;Min assist Gait Distance (Feet): 120 Feet Assistive device: Rolling walker (2 wheeled) Gait Pattern/deviations: Trunk flexed;Step-through pattern;Drifts right/left;Wide base of support;Decreased stride length Gait velocity: Decreased Gait velocity interpretation: <1.31 ft/sec, indicative of household ambulator General Gait Details: Slow gait with RW with cues for upright postrure and occasional cues for sequencing steps.  did follwo pt with chair and pt incr distance quite a bit.  Needs someone guarding him for safety ancd cues but did much better today.    Stairs             Wheelchair Mobility    Modified Rankin (Stroke Patients Only)       Balance Overall balance assessment: Needs assistance Sitting-balance support: Feet supported;No upper extremity supported Sitting balance-Leahy Scale: Fair     Standing balance support: Bilateral upper extremity supported;During functional activity Standing balance-Leahy Scale: Poor Standing balance comment: Reliant on UE support and external support                            Cognition Arousal/Alertness: Awake/alert Behavior During Therapy: Flat affect Overall Cognitive Status: No family/caregiver present to determine baseline cognitive functioning Area of Impairment: Orientation;Attention;Following commands;Safety/judgement;Awareness;Problem solving                 Orientation Level: Disoriented to;Time Current Attention Level: Focused;Sustained   Following Commands: Follows one step commands with increased time;Follows one step commands inconsistently Safety/Judgement: Decreased awareness  of safety;Decreased awareness of deficits Awareness: Intellectual Problem Solving: Slow processing;Decreased initiation;Difficulty sequencing;Requires verbal cues General  Comments: Patient able to follow 1-2 step verbal commands with increased accuracy this date. Continued increased time to process verbal information.       Exercises      General Comments General comments (skin integrity, edema, etc.): VSS      Pertinent Vitals/Pain Pain Assessment: No/denies pain    Home Living                      Prior Function            PT Goals (current goals can now be found in the care plan section) Acute Rehab PT Goals Patient Stated Goal: To get stronger.  Progress towards PT goals: Progressing toward goals    Frequency    Min 3X/week      PT Plan Discharge plan needs to be updated;Frequency needs to be updated    Co-evaluation              AM-PAC PT "6 Clicks" Mobility   Outcome Measure  Help needed turning from your back to your side while in a flat bed without using bedrails?: None Help needed moving from lying on your back to sitting on the side of a flat bed without using bedrails?: A Little Help needed moving to and from a bed to a chair (including a wheelchair)?: A Little Help needed standing up from a chair using your arms (e.g., wheelchair or bedside chair)?: A Little Help needed to walk in hospital room?: A Little Help needed climbing 3-5 steps with a railing? : A Lot 6 Click Score: 18    End of Session Equipment Utilized During Treatment: Gait belt Activity Tolerance: Patient tolerated treatment well;Patient limited by fatigue Patient left: with call bell/phone within reach;in chair;with chair alarm set Nurse Communication: Mobility status PT Visit Diagnosis: Unsteadiness on feet (R26.81);History of falling (Z91.81);Muscle weakness (generalized) (M62.81)     Time: 1751-0258 PT Time Calculation (min) (ACUTE ONLY): 17 min  Charges:  $Gait Training: 8-22 mins                     Ezrah Panning W,PT [place] Pager:  [phone]  Office:  [place] 11/06/2019, 2:23  PM

## 2019-11-06 NOTE — Progress Notes (Signed)
   Subjective:  Patient is examined at bedside. Patient reports no complain. Waiting for SNF placment.  Objective:  Vital signs in last 24 hours: Vitals:   11/05/19 2059 11/06/19 0000 11/06/19 0400 11/06/19 0421  BP: 120/78 (!) 138/97 113/80   Pulse: 93 85 88   Resp: 18 19 16    Temp: 98.9 F (37.2 C) 98.2 F (36.8 C) 98.1 F (36.7 C)   TempSrc: Oral Oral Oral   SpO2: 99% 99% 100%   Weight:    65.5 kg  Height:        Physical Exam Constitutional: no acute distress Head: atraumatic ENT: external ears normal Eyes: EOMI Cardiovascular: regular rate and rhythm, normal heart sounds Pulmonary: effort normal, lungs clear to ascultation bilaterally Abdominal: flat, nontender, no rebound tenderness, bowel sounds normal Skin: warm and dry Neurological: alert, no focal deficit, moves slowly and speaks softly Psychiatric: normal mood and affect  Assessment/Plan: Chase Parsons is a 64 y.o. male with hx of alcohol abuse and homelessness who presented for alcohol withdrawal, also had a likely type II NSTEMI during his withdrawals. He is much improved at this time and is pending SNF placement.  Principal Problem:   Alcohol withdrawal ([place]) Active Problems:   Falls frequently   Alcohol use disorder, moderate, dependence (HCC)   Hyponatremia   Encephalopathy  Alcohol Withdrawal Mental statussignificantly improved, though hestill moves relatively slowly. Off of librium, ativan, CIWA protocol. -continue PO thiamine, completed course of high dose IV thiamine -Continue PPI -TOC for SNF placement, difficulty due to alcohol use  Hypomagnesemia Hypophosphatemia Hypokalemia Electrolyte abnormalities improving. Repleting as needed. -Continue folate and MVI  -Mg1.2, repleting -K3.5 -Ph4.0  Anemia of chronic disease Hgb 10.6 most recently, on 7/7. MCV slightly elevated at times. B12 level normal. SupplementedB12 and folate. Fe 42, TIBC 237, ferritin 1620. Most likely  anemia of chronic disease. -f/u methylmalonic acid level  Homelessness Social work following.  Diet:regular WPV:XYIA needed XKP:[id number] Prior to Admission Living Arrangement:home Anticipated Discharge Location:SNF Barriers to Discharge:SNF placement Dispo: Anticipated dischargeas soon as SNF bed available  Andrew Au, MD 11/06/2019, 5:48 AM Pager: [phone] After 5pm on weekdays and 1pm on weekends: On Call pager [phone]

## 2019-11-07 LAB — BASIC METABOLIC PANEL
Anion gap: 14 (ref 5–15)
BUN: 7 mg/dL — ABNORMAL LOW (ref 8–23)
CO2: 25 mmol/L (ref 22–32)
Calcium: 9.8 mg/dL (ref 8.9–10.3)
Chloride: 101 mmol/L (ref 98–111)
Creatinine, Ser: 0.78 mg/dL (ref 0.61–1.24)
GFR calc Af Amer: >60 mL/min (ref >60)
GFR calc non Af Amer: >60 mL/min (ref >60)
Glucose, Bld: 85 mg/dL (ref 70–99)
Potassium: 3.6 mmol/L (ref 3.5–5.1)
Sodium: 140 mmol/L (ref 135–145)

## 2019-11-07 LAB — PHOSPHORUS: Phosphorus: 4.1 mg/dL (ref 2.5–4.6)

## 2019-11-07 LAB — MAGNESIUM: Magnesium: 1.3 mg/dL — ABNORMAL LOW (ref 1.7–2.4)

## 2019-11-07 LAB — GLUCOSE, CAPILLARY: Glucose-Capillary: 82 mg/dL (ref 70–99)

## 2019-11-07 MED ORDER — PANTOPRAZOLE SODIUM 40 MG PO TBEC: 40.0000 mg | DELAYED_RELEASE_TABLET | Freq: Every day | ORAL | 0 refills | Status: DC

## 2019-11-07 MED ORDER — ADULT MULTIVITAMIN W/MINERALS CH: 1.0000 | ORAL_TABLET | Freq: Every day | ORAL | 0 refills | Status: DC

## 2019-11-07 MED ORDER — THIAMINE HCL 100 MG PO TABS: 100.0000 mg | ORAL_TABLET | Freq: Every day | ORAL | 0 refills | Status: DC

## 2019-11-07 MED ORDER — FOLIC ACID 1 MG PO TABS: 1.0000 mg | ORAL_TABLET | Freq: Every day | ORAL | 0 refills | Status: DC

## 2019-11-07 MED ORDER — MAGNESIUM SULFATE 4 GM/100ML IV SOLN
4.0000 g | Freq: Once | INTRAVENOUS | Status: AC
  Administered 2019-11-07: 4 g via INTRAVENOUS
  Filled 2019-11-07: qty 100

## 2019-11-07 MED ORDER — FLUOXETINE HCL 10 MG PO CAPS: 10.0000 mg | ORAL_CAPSULE | Freq: Every day | ORAL | 0 refills | Status: DC

## 2019-11-07 MED FILL — VITAMIN B-1 100 MG TABS: 100 | 90 days supply | Qty: 90 | Fill #0

## 2019-11-07 MED FILL — FOLIC ACID 1 MG TABS: 1 | 30 days supply | Qty: 30 | Fill #0

## 2019-11-07 MED FILL — FLUoxetine HCL 10 MG CAPS: 10 | 30 days supply | Qty: 30 | Fill #0

## 2019-11-07 MED FILL — PANTOPRAZOLE SOD DR 40 MG T: 40 | 90 days supply | Qty: 90 | Fill #0

## 2019-11-07 NOTE — Progress Notes (Addendum)
I was able to speak to patient's sister Chase Parsons to update her about her brothers hospital course so far.  She made me aware that she is willing to for him assistance including housing after his discharge from the hospital.  I updated her regarding potential SNF placement and she made me aware that her husband is usually at home throughout the day and will be able to provide assistance to Chase Parsons.  She did request that if discharged to home, he will provided resources such as physical therapy and nursing aide to provide further assistance as she has 2 jobs.  She expressed gratitude for the care her brother has received in the hospital and is hopeful about his long-term progress and she is willing to help him find a job after discharge.  Portions of this note were generated with [employer]. Dictation errors may occur despite best attempts at proofreading.

## 2019-11-07 NOTE — Progress Notes (Signed)
   Subjective: Patient is seen at bedside today. He reports feeling well. Has mild abdominal pain which is similar to previous symptoms which come and go.  Discussed discharge plan, and that we have been unable to arrange SNF thus far. He is agreeable to going to stay with his sister with home health and home PT if a SNF cannot be arranged.   Objective:  Vital signs in last 24 hours: Vitals:   11/06/19 2050 11/07/19 0000 11/07/19 0400 11/07/19 0431  BP:  124/74 116/68   Pulse:      Resp:  19  18  Temp: 98.3 F (36.8 C) 98.6 F (37 C) 97.9 F (36.6 C)   TempSrc: Oral Oral Oral   SpO2: 99% 99% 99%   Weight:   66.2 kg   Height:        Physical Exam Constitutional: no acute distress Head: atraumatic ENT: external ears normal Eyes: EOMI Cardiovascular: regular rate and rhythm, normal heart sounds Pulmonary: effort normal, lungs clear to ascultation bilaterally Abdominal: flat, nontender, no rebound tenderness, bowel sounds normal Skin: warm and dry Neurological: alert, no focal deficit, moves slowly and speaks softly Psychiatric: normal mood and affect  Assessment/Plan: Chase Parsons is a 64 y.o. male with hx of alcohol abuse and homelessness who presented for alcohol withdrawal, also had a likely type II NSTEMI during his withdrawals. He is much improved at this time and is pending disposition.  Principal Problem:   Alcohol withdrawal ([place]) Active Problems:   Falls frequently   Alcohol use disorder, moderate, dependence (HCC)   Hyponatremia   Encephalopathy  Alcohol Withdrawal Mental statussignificantly improved, though hestill moves relatively slowly. Off of librium, ativan, CIWA protocol. -continue PO thiamine, completed course of high dose IV thiamine -Continue PPI -TOC for SNF placement difficulty due to alcohol use, vs home health  Hypomagnesemia Hypophosphatemia Hypokalemia Electrolyte abnormalities improving. Repleting as needed. -Continue folate and  MVI  -Mg1.4, repleting -K3.6 -Ph4.3  Anemia of chronic disease Hgb 10.8 most recently, on 7/11. MCV slightly elevated at times. B12 level normal. SupplementedB12 and folate. Fe 42, TIBC 237, ferritin 1620. Most likely anemia of chronic disease. Methylmalonic acid level normal at 205. -f/u outpatient  Homelessness Social work following.  Diet:regular RAX:ENMM needed HWK:GSUPJSR Prior to Admission Living Arrangement:home Anticipated Discharge Location:SNF Barriers to Discharge:SNF placement Dispo: Anticipated dischargeas soon as SNF bed available  Andrew Au, MD 11/07/2019, 6:20 AM Pager: [phone] After 5pm on weekdays and 1pm on weekends: On Call pager [phone]

## 2019-11-07 NOTE — Plan of Care (Signed)
  Problem: Education: Goal: Knowledge of General Education information will improve Description Including pain rating scale, medication(s)/side effects and non-pharmacologic comfort measures Outcome: Progressing   Problem: Health Behavior/Discharge Planning: Goal: Ability to manage health-related needs will improve Outcome: Progressing   

## 2019-11-07 NOTE — TOC Transition Note (Addendum)
Transition of Care [facility]) - CM/SW Discharge Note   Patient Details  Name: Chase Parsons MRN: [id number] Date of Birth: [birth year]  Transition of Care [facility]) CM/SW Contact:  Zenon Mayo, RN Phone Number: 11/07/2019, 2:54 PM   Clinical Narrative:    NCM spoke with patient and sister at bedside, she states her husband can take patient to outpatient physical therapy.  He would like to have a rollator.  NCM made referral to [facility] with [employer] for rollator to be brought up to patient room. Per Resident he will plan to dc patient today and the internal medicine clinic will make a follow up apt for him. NCM will give patient sister the [facility] paperwork, which she will need to have the MD at internal medicine follow up on.  Final next level of care: Home/Self Care Barriers to Discharge: No Barriers Identified   Patient Goals and CMS Choice Patient states their goals for this hospitalization and ongoing recovery are:: get better      Discharge Placement                       Discharge Plan and Services                DME Arranged: Walker rolling with seat DME Agency: [employer] Date DME Agency Contacted: 11/07/19 Time DME Agency Contacted: [id number] Representative spoke with at DME Agency: [place]: NA          Social Determinants of Health ([place]) Interventions     Readmission Risk Interventions No flowsheet data found.

## 2019-11-07 NOTE — Progress Notes (Signed)
Discharge instructions reviewed with pt and sister.  IV removed. External cath removed.

## 2019-12-04 ENCOUNTER — Ambulatory Visit: Payer: Medicaid Other | Attending: Internal Medicine

## 2019-12-04 ENCOUNTER — Other Ambulatory Visit: Payer: Self-pay

## 2019-12-04 DIAGNOSIS — R2681 Unsteadiness on feet: Secondary | ICD-10-CM | POA: Diagnosis present

## 2019-12-04 DIAGNOSIS — G8929 Other chronic pain: Secondary | ICD-10-CM | POA: Diagnosis present

## 2019-12-04 DIAGNOSIS — M25561 Pain in right knee: Secondary | ICD-10-CM | POA: Diagnosis present

## 2019-12-04 DIAGNOSIS — M6281 Muscle weakness (generalized): Secondary | ICD-10-CM | POA: Diagnosis present

## 2019-12-04 NOTE — Therapy (Addendum)
[facility] [address] [place], [place], [address] Phone: [phone]   Fax:  [phone]  Physical Therapy Evaluation  Patient Details  Name: Chase Parsons MRN: [id number] Date of Birth: 17-Jul-1955 Referring Provider (PT): Dr. Lenice Pressman   Encounter Date: 12/04/2019   PT End of Session - 12/04/19 1858    Visit Number 1    Number of Visits 4    Date for PT Re-Evaluation 01/01/20    Authorization Type Medicaid (eval 12/04/19)    Authorization Time Period 1    Authorization - Visit Number 4    Authorization - Number of Visits 4    Progress Note Due on Visit 4    PT Start Time 4481    PT Stop Time 1615    PT Time Calculation (min) 45 min    Equipment Utilized During Treatment Gait belt    Activity Tolerance Patient tolerated treatment well    Behavior During Therapy [facility] for tasks assessed/performed           Past Medical History:  Diagnosis Date  . Alcohol abuse   . Pneumonia     History reviewed. No pertinent surgical history.  There were no vitals filed for this visit.    Subjective Assessment - 12/04/19 1650    Subjective Chase Parsons is a 64 y.o. male with hx of homelessness, EtOH abuse and depression who went to ED for complaints of generalized weakness. Pt has hx of multiple falls. Pt reports almost daily fall frequency. Pt reports of feeling dizzy and legs giving out from under him when he falls. Pt used to use a walking sticks (tree branch) when walking on the streets prior to hospital admission. Was recently hospitalized due to fall and weakness from 10/23/19 to 11/07/19. Pt reports of having R knee pain from having multiple falls. Pt is leaving with his sister and his brother in law right now. He is using a rollator that was given to him in the hospital.    Patient is accompained by: Family member   Sister   Pertinent History depression, EtOH abuse, encephaloapathy    Limitations Walking;House hold  activities;Standing    How long can you sit comfortably? no issues    How long can you stand comfortably? 5 min    How long can you walk comfortably? 5 min    Patient Stated Goals Get stronger    Currently in Pain? Yes    Pain Score 5     Pain Location Knee    Pain Orientation Right    Pain Descriptors / Indicators Aching    Pain Type Chronic pain    Pain Onset More than a month ago    Pain Frequency Intermittent              [facility] PT Assessment - 12/04/19 1651      Assessment   Medical Diagnosis Weakness    Referring Provider (PT) Dr. Lenice Pressman    Onset Date/Surgical Date 11/07/19    Prior Therapy inpatient      Precautions   Precautions Fall      Balance Screen   Has the patient fallen in the past 6 months Yes    How many times? multiple      Home Environment   Living Environment Private residence    Living Arrangements Other relatives   sister and brother in law   Available Help at Discharge Family    Type of [place]  Home Access Stairs to enter    Entrance Stairs-Number of Steps 3   on porch   Entrance Stairs-Rails Right;Left;Can reach both    Home Layout One level    [place] - 4 wheels      Cognition   Overall Cognitive Status Within Functional Limits for tasks assessed    Attention Focused    Focused Attention Appears intact    Memory Appears intact    Awareness Appears intact    Problem Solving Appears intact      Ambulation/Gait   Ambulation/Gait Yes    Ambulation/Gait Assistance 6: Modified independent (Device/Increase time)    Ambulation Distance (Feet) 640 Feet    Assistive device 4-wheeled walker    Gait Pattern Poor foot clearance - left;Poor foot clearance - right;Trunk flexed;Decreased step length - right;Decreased step length - left    Ambulation Surface Level;Indoor      6 Minute Walk- Baseline   6 Minute Walk- Baseline yes      6 minute walk test results    Aerobic Endurance Distance Walked 640   with  rollator     Standardized Balance Assessment   Standardized Balance Assessment Timed Up and Go Test;Five Times Sit to Stand    Five times sit to stand comments  58   no HHA     Timed Up and Go Test   Normal TUG (seconds) 30   rollaotr                     Objective measurements completed on examination: See above findings.                 PT Short Term Goals - 12/04/19 1850      PT SHORT TERM GOAL #1   Title Patient will demo 18 seconds or better with 5 x sit to stand test to improve functional strength    Baseline 20 seconds (eval)    Time 8    Period Weeks    Status New    Target Date 01/29/20      PT SHORT TERM GOAL #2   Title Patient will be able to ambulate for 15 min with rollator to improve community ambulation    Baseline 6 min    Time 8    Period Weeks    Status New    Target Date 01/29/20      PT SHORT TERM GOAL #3   Title Patient will Demo 8 points improvement on berg balance scale to improve static balance    Baseline TBD    Time 8    Period Weeks    Target Date 01/29/20             PT Long Term Goals - 12/04/19 1852      PT LONG TERM GOAL #1   Title Patient will demo 115' improvement with 6 minute walk test with rollator to improve functional walking endurance    Baseline 640 feet with rollator    Time 12    Period Weeks    Status New    Target Date 02/26/20      PT LONG TERM GOAL #2   Title Patient will demo 16 sec or better with 5 x sit to stand to improve functional strength    Baseline 20 sec    Time 12    Period Weeks    Status New    Target Date 02/26/20      PT LONG  TERM GOAL #3   Title Patient will demo <20 seconds on timed up and go test to improve functional mobility    Baseline 30 sec    Time 12    Period Weeks    Status New    Target Date 02/26/20      PT LONG TERM GOAL #4   Title Pt will be I and compliant with HEP to self manage his symptoms    Baseline no HEP isssued    Time 12    Period  Weeks    Status New    Target Date 02/26/20                  Plan - 12/04/19 1854    Clinical Impression Statement Patient is a 64 y.o. male who was seen today for physical therapy evaluation and treatment for gait and balance disorder and generalized weakness. Patient has had hx of multiple falls. Patient reports of having R knee pain from multiple falls. Objective findings demonstrate decreased functional strength (5x sit to stand test), decreased functional mobility (Timed up and go test) and decreased walking endurance (6 minute walk test). Patient will benefit from skilled PT to address these impairments and improve overall function.    Personal Factors and Comorbidities Comorbidity 3+;Past/Current Experience;Social Background;Time since onset of injury/illness/exacerbation;Transportation    Comorbidities depression, EtOH abuse, encephaloapathy    Examination-Activity Limitations Caring for Others;Carry;Lift;Locomotion Level;Squat;Stairs;Stand;Transfers    Examination-Participation Restrictions Cleaning;Community Activity;Laundry;Yard Work;Shop;Meal Prep    Stability/Clinical Decision Making Evolving/Moderate complexity    Clinical Decision Making Moderate    Rehab Potential Good    PT Frequency 1x / week    PT Duration 4 weeks    PT Treatment/Interventions ADLs/Self Care Home Management;Gait training;Stair training;Functional mobility training;Therapeutic activities;Therapeutic exercise;Balance training;Neuromuscular re-education;Manual techniques;Patient/family education;Scar mobilization;Passive range of motion;Energy conservation;Visual/perceptual remediation/compensation;Joint Manipulations    PT Next Visit Plan Assess Berg balance scale, issue HEP    PT Home Exercise Plan TBD    Consulted and Agree with Plan of Care Patient;Family member/caregiver    Family Member Consulted Sister           Patient will benefit from skilled therapeutic intervention in order to improve  the following deficits and impairments:  Abnormal gait, Decreased activity tolerance, Decreased balance, Decreased mobility, Decreased endurance, Decreased safety awareness, Decreased strength, Difficulty walking, Dizziness, Increased fascial restricitons, Postural dysfunction, Impaired vision/preception, Pain  Visit Diagnosis: Muscle weakness (generalized) - Plan: PT plan of care cert/re-cert  Unsteadiness on feet - Plan: PT plan of care cert/re-cert  Chronic pain of right knee - Plan: PT plan of care cert/re-cert     Problem List Patient Active Problem List   Diagnosis Date Noted  . Encephalopathy   . Falls frequently 10/24/2019  . Alcohol use disorder, moderate, dependence ([place]) 10/24/2019  . Alcohol withdrawal ([place]) 10/24/2019  . Hyponatremia 10/24/2019  . MDD (major depressive disorder), single episode, severe , no psychosis ([place]) 01/26/2015  . Severe single current episode of major depressive disorder, without psychotic features ([place]) 01/26/2015  . Suicidal thoughts     Kerrie Pleasure, PT 12/04/2019, 7:02 PM  [facility] [address] [place], [place], [address] Phone: [phone]   Fax:  [phone]  Name: Chase Parsons MRN: [id number] Date of Birth: January 04, 1956

## 2019-12-06 ENCOUNTER — Encounter: Payer: Self-pay | Admitting: Student

## 2019-12-06 ENCOUNTER — Ambulatory Visit: Payer: Medicaid Other | Admitting: Student

## 2019-12-06 VITALS — BP 147/85 | HR 60 | Temp 98.0°F | Ht 72.0 in | Wt 156.8 lb

## 2019-12-06 DIAGNOSIS — Z0000 Encounter for general adult medical examination without abnormal findings: Secondary | ICD-10-CM | POA: Insufficient documentation

## 2019-12-06 DIAGNOSIS — F5101 Primary insomnia: Secondary | ICD-10-CM | POA: Diagnosis not present

## 2019-12-06 DIAGNOSIS — F322 Major depressive disorder, single episode, severe without psychotic features: Secondary | ICD-10-CM | POA: Diagnosis not present

## 2019-12-06 DIAGNOSIS — R197 Diarrhea, unspecified: Secondary | ICD-10-CM

## 2019-12-06 DIAGNOSIS — I8500 Esophageal varices without bleeding: Secondary | ICD-10-CM | POA: Insufficient documentation

## 2019-12-06 DIAGNOSIS — D638 Anemia in other chronic diseases classified elsewhere: Secondary | ICD-10-CM

## 2019-12-06 DIAGNOSIS — I8510 Secondary esophageal varices without bleeding: Secondary | ICD-10-CM

## 2019-12-06 DIAGNOSIS — R1013 Epigastric pain: Secondary | ICD-10-CM

## 2019-12-06 DIAGNOSIS — K219 Gastro-esophageal reflux disease without esophagitis: Secondary | ICD-10-CM | POA: Diagnosis not present

## 2019-12-06 DIAGNOSIS — Z0001 Encounter for general adult medical examination with abnormal findings: Secondary | ICD-10-CM

## 2019-12-06 DIAGNOSIS — Z87898 Personal history of other specified conditions: Secondary | ICD-10-CM

## 2019-12-06 DIAGNOSIS — D649 Anemia, unspecified: Secondary | ICD-10-CM | POA: Insufficient documentation

## 2019-12-06 DIAGNOSIS — G4700 Insomnia, unspecified: Secondary | ICD-10-CM | POA: Insufficient documentation

## 2019-12-06 DIAGNOSIS — R5381 Other malaise: Secondary | ICD-10-CM

## 2019-12-06 MED ORDER — ADULT MULTIVITAMIN W/MINERALS CH: 1.0000 | ORAL_TABLET | Freq: Every day | ORAL | 1 refills | Status: AC

## 2019-12-06 MED ORDER — RAMELTEON 8 MG PO TABS: 8.0000 mg | ORAL_TABLET | Freq: Every evening | ORAL | 1 refills | Status: DC | PRN

## 2019-12-06 MED ORDER — FLUOXETINE HCL 10 MG PO CAPS: 10.0000 mg | ORAL_CAPSULE | Freq: Every day | ORAL | 1 refills | Status: DC

## 2019-12-06 MED ORDER — THIAMINE HCL 100 MG PO TABS: 100.0000 mg | ORAL_TABLET | Freq: Every day | ORAL | 1 refills | Status: DC

## 2019-12-06 MED ORDER — FOLIC ACID 1 MG PO TABS: 1.0000 mg | ORAL_TABLET | Freq: Every day | ORAL | 1 refills | Status: DC

## 2019-12-06 MED ORDER — PANTOPRAZOLE SODIUM 40 MG PO TBEC: 40.0000 mg | DELAYED_RELEASE_TABLET | Freq: Every day | ORAL | 1 refills | Status: DC

## 2019-12-06 NOTE — Assessment & Plan Note (Signed)
He is staying with his sister, who is the caretaker. No alcohol in the household. Patient states that he is motivated to quit because he does not want to get sick again. Declines further assistance at this time. Weight is up from time of discharge.  -follow up in 3 months

## 2019-12-06 NOTE — Assessment & Plan Note (Signed)
Previously had frequent falls, then was hospitalized for alcohol withdrawal. During this lengthy stay, was noted to be significantly deconditioned. Started outpatient PT this week. His sister is a Librarian, academic for a [employer], [employer], and she wants to try to get him a job when he is able.  -continue outpatient PT -follow up in 3 months

## 2019-12-06 NOTE — Assessment & Plan Note (Signed)
-  start ramelteon

## 2019-12-06 NOTE — Assessment & Plan Note (Addendum)
Appears asymptomatic at this time. Last hgb 10.0 during recent hospitalization. Has stopped drinking alcohol and no other chronic diseases, so hopeful that this will improve.  -CBC   Addendum: Hgb improved to 12.6, anemia appears to be resolving

## 2019-12-06 NOTE — Assessment & Plan Note (Signed)
Continue to have epigastric pain.  -continue Protonix 40mg  daily

## 2019-12-06 NOTE — Assessment & Plan Note (Signed)
Patient stated on 10mg  fluoxetine during hospitalization for depressed mood and withdrawn demeanor. On follow up today, he has somewhat withdrawn demeanor still but laughs at jokes and interacts appropriately. PHQ-9 score of 2.  -continue fluoxetine 10mg  daily

## 2019-12-06 NOTE — Assessment & Plan Note (Signed)
Started 4-5 days prior to admission, but has increased since discharge. Had a few days of broad spectrum antibiotics during hospitalization. Now it is 5 times every day. Has to use the bathroom every time that he eats. Continued mild L periumbilical abdominal pain.   -C diff antigen -fecal pancreatic elastase -stool GI panel -CMP, CBC

## 2019-12-06 NOTE — Progress Notes (Signed)
   CC: hospital follow up, establish care  HPI:  [name] is a 64 y.o. male with history as below presenting for hospital follow up for alcohol withdrawal and to establish care. He is overall doing much better than when I saw him inpatient. Please refer to problem based charting for further details of assessment and plan of current problem and chronic medical conditions.  Past Medical History:  Diagnosis Date  . Alcohol abuse   . Pneumonia    Current Outpatient Medications on File Prior to Visit  Medication Sig Dispense Refill  . FLUoxetine (PROZAC) 10 MG capsule Take 1 capsule (10 mg total) by mouth daily. (Patient not taking: Reported on 12/04/2019) 30 capsule 0  . folic acid (FOLVITE) 1 MG tablet Take 1 tablet (1 mg total) by mouth daily. 90 tablet 0  . Multiple Vitamin (MULTIVITAMIN WITH MINERALS) TABS tablet Take 1 tablet by mouth daily. 90 tablet 0  . pantoprazole (PROTONIX) 40 MG tablet Take 1 tablet (40 mg total) by mouth daily. 90 tablet 0  . thiamine 100 MG tablet Take 1 tablet (100 mg total) by mouth daily. 90 tablet 0   No current facility-administered medications on file prior to visit.    Denies any surgical history  Family History  Problem Relation Age of Onset  . Diabetes Mother   . Hypertension Mother   . Pancreatic cancer Mother   . Alcohol abuse Father   . Diabetes Sister   . Cancer Brother   . Congestive Heart Failure Sister      Social History   Tobacco Use  . Smoking status: Never Smoker  . Smokeless tobacco: Never Used  Vaping Use  . Vaping Use: Never used  Substance Use Topics  . Alcohol use: Yes    Alcohol/week: 6.0 standard drinks    Types: 6 Cans of beer per week    Comment: "All day"  . Drug use: No    Review of Systems:   Review of Systems  Constitutional: Negative for chills and fever.  Eyes: Negative for blurred vision and double vision.  Respiratory: Negative for cough, shortness of breath and wheezing.   Cardiovascular:  Positive for chest pain (sharp pain). Negative for palpitations.  Gastrointestinal: Positive for abdominal pain, blood in stool and diarrhea. Negative for nausea and vomiting.  Musculoskeletal: Positive for joint pain (R knee).  Skin: Positive for itching. Negative for rash.  Neurological: Positive for weakness (general). Negative for dizziness and loss of consciousness.     Physical Exam: Vitals:   12/06/19 1507  BP: (!) 147/85  Pulse: 60  Temp: 98 F (36.7 C)  TempSrc: Oral  SpO2: 99%  Weight: 156 lb 12.8 oz (71.1 kg)  Height: 6' (1.829 m)   Constitutional: no acute distress, patient tends to stare forward but is interactive and makes some eye contact Head: atraumatic ENT: external ears normal Cardiovascular: regular rate and rhythm, normal heart sounds Pulmonary: effort normal, normal breath sounds bilaterally Abdominal: flat, mild tenderness diffusely, no rebound tenderness, bowel sounds normal Musculoskeletal: has a slow but steady gait with walker Skin: warm and dry Neurological: alert, no focal deficit Psychiatric: somewhat withdrawn but is interactive and smiles appropriately   Assessment & Plan:   See Encounters Tab for problem based charting.  Patient seen with Dr. Rebeca Alert

## 2019-12-06 NOTE — Assessment & Plan Note (Signed)
Elects for colon CA screening at next visit after his diarrhea improves.

## 2019-12-06 NOTE — Patient Instructions (Addendum)
Thank you for allowing [place] to be a part of your care today, it was pleasure seeing you. We discussed your diarrhea, former alcohol use, deconditioning,   I am checking these labs: CBC, CMP, HepC, C diff, fecal pancreatic elastase, stool GI panel  I have made these changes to your medications: Start ramelteon 8mg  for sleep as needed  I have placed a GI referral for your esophageal varices.  Please follow up in 3 months, or sooner if your diarrhea is unresolved.  Plan to address screening for colon cancer at next visit.   Thank you, and please call the Internal Medicine Clinic at [phone] if you have any questions.  Best, Dr. Bridgett Larsson

## 2019-12-06 NOTE — Assessment & Plan Note (Signed)
Asymptomatic with no signs of upper GI bleeding.  -GI referral placed

## 2019-12-07 ENCOUNTER — Telehealth: Payer: Self-pay | Admitting: *Deleted

## 2019-12-07 LAB — CMP14 + ANION GAP
ALT: 13 IU/L (ref 0–44)
AST: 17 IU/L (ref 0–40)
Albumin/Globulin Ratio: 1.2 (ref 1.2–2.2)
Albumin: 4.4 g/dL (ref 3.8–4.8)
Alkaline Phosphatase: 84 IU/L (ref 48–121)
Anion Gap: 14 mmol/L (ref 10.0–18.0)
BUN/Creatinine Ratio: 8 — ABNORMAL LOW (ref 10–24)
BUN: 5 mg/dL — ABNORMAL LOW (ref 8–27)
Bilirubin Total: 0.4 mg/dL (ref 0.0–1.2)
CO2: 26 mmol/L (ref 20–29)
Calcium: 9.9 mg/dL (ref 8.6–10.2)
Chloride: 101 mmol/L (ref 96–106)
Creatinine, Ser: 0.64 mg/dL — ABNORMAL LOW (ref 0.76–1.27)
GFR calc Af Amer: 120 mL/min/{1.73_m2} (ref >59)
GFR calc non Af Amer: 103 mL/min/{1.73_m2} (ref >59)
Globulin, Total: 3.7 g/dL (ref 1.5–4.5)
Glucose: 88 mg/dL (ref 65–99)
Potassium: 3.6 mmol/L (ref 3.5–5.2)
Sodium: 141 mmol/L (ref 134–144)
Total Protein: 8.1 g/dL (ref 6.0–8.5)

## 2019-12-07 LAB — CBC
Hematocrit: 36.6 % — ABNORMAL LOW (ref 37.5–51.0)
Hemoglobin: 12.6 g/dL — ABNORMAL LOW (ref 13.0–17.7)
MCH: 33.5 pg — ABNORMAL HIGH (ref 26.6–33.0)
MCHC: 34.4 g/dL (ref 31.5–35.7)
MCV: 97 fL (ref 79–97)
Platelets: 214 10*3/uL (ref 150–450)
RBC: 3.76 x10E6/uL — ABNORMAL LOW (ref 4.14–5.80)
RDW: 11.6 % (ref 11.6–15.4)
WBC: 8.9 10*3/uL (ref 3.4–10.8)

## 2019-12-07 LAB — HEPATITIS C ANTIBODY: Hep C Virus Ab: <0.1 s/co ratio (ref 0.0–0.9)

## 2019-12-07 NOTE — Progress Notes (Signed)
Internal Medicine Clinic Attending  I saw and evaluated the patient.  I personally confirmed the key portions of the history and exam documented by Dr. Bridgett Larsson and I reviewed pertinent patient test results.  The assessment, diagnosis, and plan were formulated together and I agree with the documentation in the resident's note.  Lenice Pressman, M.D., Ph.D.

## 2019-12-07 NOTE — Telephone Encounter (Signed)
Patient's sister walked in with [facility] paperwork to be completed. Dr. Bridgett Larsson completed this and form was handed back to sister at her request. Copy made for patient's chart. Hubbard Hartshorn, BSN, RN-BC

## 2019-12-13 ENCOUNTER — Other Ambulatory Visit: Payer: Self-pay | Admitting: Student

## 2019-12-13 DIAGNOSIS — F5101 Primary insomnia: Secondary | ICD-10-CM

## 2019-12-13 MED ORDER — MELATONIN 1 MG PO TABS: 1.0000 mg | ORAL_TABLET | Freq: Every day | ORAL | 3 refills | Status: DC

## 2019-12-19 ENCOUNTER — Ambulatory Visit: Payer: Medicaid Other

## 2019-12-19 ENCOUNTER — Other Ambulatory Visit: Payer: Self-pay

## 2019-12-19 DIAGNOSIS — G8929 Other chronic pain: Secondary | ICD-10-CM

## 2019-12-19 DIAGNOSIS — M6281 Muscle weakness (generalized): Secondary | ICD-10-CM

## 2019-12-19 DIAGNOSIS — R2681 Unsteadiness on feet: Secondary | ICD-10-CM

## 2019-12-19 NOTE — Therapy (Signed)
[facility] [address] [place], [place], [address] Phone: [phone]   Fax:  [phone]  Physical Therapy Treatment  Patient Details  Name: Chase Parsons MRN: [id number] Date of Birth: 1955/10/02 Referring Provider (PT): Dr. Lenice Pressman   Encounter Date: 12/19/2019   PT End of Session - 12/19/19 1549    Visit Number 2    Number of Visits 4    Date for PT Re-Evaluation 01/01/20    Authorization Type Medicaid (eval 12/04/19)    Authorization Time Period 2    Authorization - Visit Number 4    Authorization - Number of Visits 4    Progress Note Due on Visit 4    PT Start Time 1540    PT Stop Time 1620    PT Time Calculation (min) 40 min    Equipment Utilized During Treatment Gait belt    Activity Tolerance Patient tolerated treatment well    Behavior During Therapy [facility] for tasks assessed/performed           Past Medical History:  Diagnosis Date  . Alcohol abuse   . Alcohol use disorder, moderate, dependence ([place]) 10/24/2019  . Encephalopathy   . Hyponatremia 10/24/2019  . Pneumonia   . Severe single current episode of major depressive disorder, without psychotic features ([place]) 01/26/2015  . Suicidal thoughts     No past surgical history on file.  There were no vitals filed for this visit.   Subjective Assessment - 12/19/19 1545    Subjective I am feeling good. I am walking every day. I think I can walk for about 58' with my rollator.    Patient is accompained by: Family member    Pertinent History depression, EtOH abuse, encephaloapathy    Limitations Walking;House hold activities;Standing    How long can you sit comfortably? no issues    How long can you stand comfortably? 5 min    How long can you walk comfortably? 5 min    Patient Stated Goals Get stronger              [facility] PT Assessment - 12/19/19 0001      6 minute walk test results    Aerobic Endurance Distance Walked 996    rollator     Standardized Balance Assessment   Five times sit to stand comments  10.30   no HHA     Timed Up and Go Test   Normal TUG (seconds) 8.15   no AD     Functional Gait  Assessment   Gait assessed  Yes    Gait Level Surface Walks 20 ft in less than 5.5 sec, no assistive devices, good speed, no evidence for imbalance, normal gait pattern, deviates no more than 6 in outside of the 12 in walkway width.    Change in Gait Speed Able to smoothly change walking speed without loss of balance or gait deviation. Deviate no more than 6 in outside of the 12 in walkway width.    Gait with Horizontal Head Turns Performs head turns smoothly with no change in gait. Deviates no more than 6 in outside 12 in walkway width    Gait with Vertical Head Turns Performs head turns with no change in gait. Deviates no more than 6 in outside 12 in walkway width.    Gait and Pivot Turn Pivot turns safely within 3 sec and stops quickly with no loss of balance.    Step Over Obstacle Is able to  step over 2 stacked shoe boxes taped together (9 in total height) without changing gait speed. No evidence of imbalance.    Gait with Narrow Base of Support Is able to ambulate for 10 steps heel to toe with no staggering.    Gait with Eyes Closed Walks 20 ft, no assistive devices, good speed, no evidence of imbalance, normal gait pattern, deviates no more than 6 in outside 12 in walkway width. Ambulates 20 ft in less than 7 sec.    Ambulating Backwards Walks 20 ft, no assistive devices, good speed, no evidence for imbalance, normal gait    Steps Alternating feet, no rail.    Total Score 30          Nustep: level 4 for 10'                          PT Education - 12/19/19 1616    Education Details Pt educated on not using his rollator at home until his next session. He should still use rollator for walking outside.            PT Short Term Goals - 12/04/19 1850      PT SHORT TERM GOAL #1    Title Patient will demo 18 seconds or better with 5 x sit to stand test to improve functional strength    Baseline 20 seconds (eval)    Time 8    Period Weeks    Status New    Target Date 01/29/20      PT SHORT TERM GOAL #2   Title Patient will be able to ambulate for 15 min with rollator to improve community ambulation    Baseline 6 min    Time 8    Period Weeks    Status New    Target Date 01/29/20      PT SHORT TERM GOAL #3   Title Patient will Demo 8 points improvement on berg balance scale to improve static balance    Baseline TBD    Time 8    Period Weeks    Target Date 01/29/20             PT Long Term Goals - 12/04/19 1852      PT LONG TERM GOAL #1   Title Patient will demo 115' improvement with 6 minute walk test with rollator to improve functional walking endurance    Baseline 640 feet with rollator    Time 12    Period Weeks    Status New    Target Date 02/26/20      PT LONG TERM GOAL #2   Title Patient will demo 16 sec or better with 5 x sit to stand to improve functional strength    Baseline 20 sec    Time 12    Period Weeks    Status New    Target Date 02/26/20      PT LONG TERM GOAL #3   Title Patient will demo <20 seconds on timed up and go test to improve functional mobility    Baseline 30 sec    Time 12    Period Weeks    Status New    Target Date 02/26/20      PT LONG TERM GOAL #4   Title Pt will be I and compliant with HEP to self manage his symptoms    Baseline no HEP isssued    Time 12    Period Weeks  Status New    Target Date 02/26/20                 Plan - 12/19/19 1547    Clinical Impression Statement Patient is demonstrating significant improvement in his functional endurnace compared to his evaluation. Pt demonstrated significant improvement in his 5x sit to stand his 6 minute walk test compared to his evaluation.    Personal Factors and Comorbidities Comorbidity 3+;Past/Current Experience;Social Background;Time  since onset of injury/illness/exacerbation;Transportation    Comorbidities depression, EtOH abuse, encephaloapathy    Examination-Activity Limitations Caring for Others;Carry;Lift;Locomotion Level;Squat;Stairs;Stand;Transfers    Examination-Participation Restrictions Cleaning;Community Activity;Laundry;Yard Work;Shop;Meal Prep    Stability/Clinical Decision Making Evolving/Moderate complexity    Rehab Potential Good    PT Frequency 1x / week    PT Duration 4 weeks    PT Treatment/Interventions ADLs/Self Care Home Management;Gait training;Stair training;Functional mobility training;Therapeutic activities;Therapeutic exercise;Balance training;Neuromuscular re-education;Manual techniques;Patient/family education;Scar mobilization;Passive range of motion;Energy conservation;Visual/perceptual remediation/compensation;Joint Manipulations    PT Next Visit Plan Next session Discharge. Educate pt to not use rollator for outside walking.    PT Home Exercise Plan TBD    Consulted and Agree with Plan of Care Patient;Family member/caregiver    Family Member Consulted Sister           Patient will benefit from skilled therapeutic intervention in order to improve the following deficits and impairments:  Abnormal gait, Decreased activity tolerance, Decreased balance, Decreased mobility, Decreased endurance, Decreased safety awareness, Decreased strength, Difficulty walking, Dizziness, Increased fascial restricitons, Postural dysfunction, Impaired vision/preception, Pain  Visit Diagnosis: Unsteadiness on feet  Chronic pain of right knee  Muscle weakness (generalized)     Problem List Patient Active Problem List   Diagnosis Date Noted  . Diarrhea 12/06/2019  . Esophageal varices and fatty infiltration of liver seen on CT scan 12/06/2019  . Insomnia 12/06/2019  . Healthcare maintenance 12/06/2019  . GERD (gastroesophageal reflux disease) 12/06/2019  . Anemia of chronic disease 12/06/2019  .  Physical deconditioning 10/24/2019  . Former consumption of alcohol 10/24/2019  . MDD (major depressive disorder), single episode, severe , no psychosis ([place]) 01/26/2015    Kerrie Pleasure 12/19/2019, 4:18 PM  [facility] [address] [place], [place], [address] Phone: [phone]   Fax:  [phone]  Name: Chase Parsons MRN: [id number] Date of Birth: 1955-05-19

## 2019-12-26 ENCOUNTER — Other Ambulatory Visit: Payer: Self-pay

## 2019-12-26 ENCOUNTER — Ambulatory Visit: Payer: Medicaid Other

## 2019-12-26 DIAGNOSIS — M6281 Muscle weakness (generalized): Secondary | ICD-10-CM

## 2019-12-26 DIAGNOSIS — R2681 Unsteadiness on feet: Secondary | ICD-10-CM

## 2019-12-26 DIAGNOSIS — M25561 Pain in right knee: Secondary | ICD-10-CM

## 2019-12-26 NOTE — Patient Instructions (Signed)
Access Code: [email or web address] URL: [email or web address] Date: 12/26/2019 Prepared by: Markus Jarvis  Exercises Sit to Stand without Arm Support - 1 x daily - 7 x weekly - 2 sets - 10 reps Standing Hip Flexion March - 1 x daily - 7 x weekly - 2 sets - 10 reps Standing Hip Abduction - 1 x daily - 7 x weekly - 2 sets - 10 reps Leg Swing Single Leg Balance - 1 x daily - 7 x weekly - 2 sets - 10 reps Standing Heel Raise - 1 x daily - 7 x weekly - 2 sets - 10 reps

## 2019-12-26 NOTE — Therapy (Signed)
[facility] [address] [place], [place], [address] Phone: [phone]   Fax:  [phone]  Physical Therapy Discharge Summary  Patient Details  Name: Chase Parsons MRN: [id number] Date of Birth: 05/20/1955 Referring Provider (PT): Dr. Lenice Pressman   Encounter Date: 12/26/2019   PT End of Session - 12/26/19 1603    Visit Number 3    Number of Visits 4    Date for PT Re-Evaluation 01/01/20    Authorization Type Medicaid (eval 12/04/19)    Authorization Time Period 3    Authorization - Visit Number 4    Authorization - Number of Visits 4    Progress Note Due on Visit 4    PT Start Time 6283    PT Stop Time 1600    PT Time Calculation (min) 30 min    Equipment Utilized During Treatment Gait belt    Activity Tolerance Patient tolerated treatment well    Behavior During Therapy [facility] for tasks assessed/performed           Past Medical History:  Diagnosis Date  . Alcohol abuse   . Alcohol use disorder, moderate, dependence ([place]) 10/24/2019  . Encephalopathy   . Hyponatremia 10/24/2019  . Pneumonia   . Severe single current episode of major depressive disorder, without psychotic features ([place]) 01/26/2015  . Suicidal thoughts     History reviewed. No pertinent surgical history.  There were no vitals filed for this visit.   Subjective Assessment - 12/26/19 1547    Subjective I am walking without rollator when I walk outside.    Patient is accompained by: Family member    Pertinent History depression, EtOH abuse, encephaloapathy    Limitations Walking;House hold activities;Standing    How long can you sit comfortably? no issues    How long can you stand comfortably? 5 min    How long can you walk comfortably? 5 min    Patient Stated Goals Get stronger              [facility] PT Assessment - 12/26/19 0001      Standardized Balance Assessment   Standardized Balance Assessment Berg Balance Test;Five Times  Sit to Stand    Five times sit to stand comments  8      Berg Balance Test   Sit to Stand Able to stand without using hands and stabilize independently    Standing Unsupported Able to stand safely 2 minutes    Sitting with Back Unsupported but Feet Supported on Floor or Stool Able to sit safely and securely 2 minutes    Stand to Sit Sits safely with minimal use of hands    Transfers Able to transfer safely, minor use of hands    Standing Unsupported with Eyes Closed Able to stand 10 seconds safely    Standing Unsupported with Feet Together Able to place feet together independently and stand 1 minute safely    From Standing, Reach Forward with Outstretched Arm Can reach confidently >25 cm (10")    From Standing Position, Pick up Object from Floor Able to pick up shoe safely and easily    From Standing Position, Turn to Look Behind Over each Shoulder Looks behind from both sides and weight shifts well    Turn 360 Degrees Able to turn 360 degrees safely in 4 seconds or less    Standing Unsupported, Alternately Place Feet on Step/Stool Able to stand independently and safely and complete 8 steps in 20 seconds  Standing Unsupported, One Foot in [place] to place foot tandem independently and hold 30 seconds    Standing on One Leg Able to lift leg independently and hold > 10 seconds    Total Score 56               Following activities performed with SBA/CGA no AD Walking 200' on grass incline and decline (moderate grade) Walking 20' on grass incline and decline (high grade) Walking up and down curb step: 2x 6 minute walk test: 1050' Following exercises given and reviewed for 2 x 10 with patient Sit to stand: no HHA: 2 x 10 SLS with contralateral hip flexion, abduction, extension: 2 x 10 R and L , no HHA Standing heel raises: 20x, no HHA                     PT Short Term Goals - 12/26/19 1547      PT SHORT TERM GOAL #1   Title Patient will demo 18 seconds or  better with 5 x sit to stand test to improve functional strength    Baseline 20 seconds (eval); 10 seconds (12/19/19)    Time 8    Period Weeks    Status Achieved    Target Date 01/29/20      PT SHORT TERM GOAL #2   Title Patient will be able to ambulate for 15 min with rollator to improve community ambulation    Baseline 6 min; 40 min (12/26/19)    Time 8    Period Weeks    Status Achieved    Target Date 01/29/20      PT SHORT TERM GOAL #3   Title Patient will Demo 8 points improvement on berg balance scale to improve static balance    Baseline 56/56 (12/26/19)    Time 8    Period Weeks    Status Achieved    Target Date 01/29/20             PT Long Term Goals - 12/26/19 1551      PT LONG TERM GOAL #1   Title Patient will demo 115' improvement with 6 minute walk test with rollator to improve functional walking endurance    Baseline 640 feet with rollator; 1040' with no AD    Time 12    Period Weeks    Status Achieved      PT LONG TERM GOAL #2   Title Patient will demo 16 sec or better with 5 x sit to stand to improve functional strength    Baseline 20 sec (eval); 8 seconds (12/26/19)    Time 12    Period Weeks    Status Achieved      PT LONG TERM GOAL #3   Title Patient will demo <20 seconds on timed up and go test to improve functional mobility    Baseline 30 sec (eval); 8.15 sec (12/19/19)    Time 12    Period Weeks    Status Achieved      PT LONG TERM GOAL #4   Title Pt will be I and compliant with HEP to self manage his symptoms    Baseline no HEP isssued    Time 12    Period Weeks    Status Achieved                 Plan - 12/26/19 1556    Clinical Impression Statement Patient has been seen for total of 3 sessions  from 12/04/19 to 12/26/19 for generalized weakness. Patient has met all of his short term and long term functional goals. Patient is compliant with walking program and exercises. patient is able to ambulate without AD. Patient demonstrates WNL  of strength and balance. patient will be discharged from skilled PT with independent home exercise program.    Personal Factors and Comorbidities Comorbidity 3+;Past/Current Experience;Social Background;Time since onset of injury/illness/exacerbation;Transportation    Comorbidities depression, EtOH abuse, encephaloapathy    Examination-Activity Limitations Caring for Others;Carry;Lift;Locomotion Level;Squat;Stairs;Stand;Transfers    Examination-Participation Restrictions Cleaning;Community Activity;Laundry;Yard Work;Shop;Meal Prep    Stability/Clinical Decision Making Evolving/Moderate complexity    Rehab Potential Good    PT Treatment/Interventions ADLs/Self Care Home Management;Gait training;Stair training;Functional mobility training;Therapeutic activities;Therapeutic exercise;Balance training;Neuromuscular re-education;Manual techniques;Patient/family education;Scar mobilization;Passive range of motion;Energy conservation;Visual/perceptual remediation/compensation;Joint Manipulations    PT Next Visit Plan Next session Discharge. Educate pt to not use rollator for outside walking.    PT Home Exercise Plan Access Code: [email or web address]: [email or web address]: 08/31/2021Prepared by: Gwenyth Bouillon PatelExercisesSit to Stand without Arm Support - 1 x daily - 7 x weekly - 2 sets - 10 repsStanding Hip Flexion March - 1 x daily - 7 x weekly - 2 sets - 10 repsStanding Hip Abduction - 1 x daily - 7 x weekly - 2 sets - 10 repsLeg Swing Single Leg Balance - 1 x daily - 7 x weekly - 2 sets - 10 repsStanding Heel Raise - 1 x daily - 7 x weekly - 2 sets - 10 reps    Consulted and Agree with Plan of Care Patient;Family member/caregiver    Family Member Consulted Sister           Patient will benefit from skilled therapeutic intervention in order to improve the following deficits and impairments:  Abnormal gait, Decreased activity tolerance, Decreased balance, Decreased mobility, Decreased endurance,  Decreased safety awareness, Decreased strength, Difficulty walking, Dizziness, Increased fascial restricitons, Postural dysfunction, Impaired vision/preception, Pain  Visit Diagnosis: Unsteadiness on feet  Chronic pain of right knee  Muscle weakness (generalized)     Problem List Patient Active Problem List   Diagnosis Date Noted  . Diarrhea 12/06/2019  . Esophageal varices and fatty infiltration of liver seen on CT scan 12/06/2019  . Insomnia 12/06/2019  . Healthcare maintenance 12/06/2019  . GERD (gastroesophageal reflux disease) 12/06/2019  . Anemia of chronic disease 12/06/2019  . Physical deconditioning 10/24/2019  . Former consumption of alcohol 10/24/2019  . MDD (major depressive disorder), single episode, severe , no psychosis ([place]) 01/26/2015    Kerrie Pleasure, PT 12/26/2019, 4:04 PM  [facility] [address] [place], [place], [address] Phone: [phone]   Fax:  [phone]  Name: Chase Parsons MRN: [id number] Date of Birth: 03-11-1956

## 2020-01-02 ENCOUNTER — Ambulatory Visit: Payer: Medicaid Other

## 2020-01-22 ENCOUNTER — Encounter (HOSPITAL_COMMUNITY): Payer: Self-pay | Admitting: Emergency Medicine

## 2020-01-22 ENCOUNTER — Emergency Department (HOSPITAL_COMMUNITY)
Admission: EM | Admit: 2020-01-22 | Discharge: 2020-01-22 | Disposition: A | Discharge status: Home / Self Care | Payer: Medicaid Other | Attending: Emergency Medicine | Admitting: Emergency Medicine

## 2020-01-22 ENCOUNTER — Emergency Department (HOSPITAL_COMMUNITY): Payer: Medicaid Other

## 2020-01-22 ENCOUNTER — Other Ambulatory Visit: Payer: Self-pay

## 2020-01-22 DIAGNOSIS — F10220 Alcohol dependence with intoxication, uncomplicated: Secondary | ICD-10-CM | POA: Insufficient documentation

## 2020-01-22 DIAGNOSIS — F10920 Alcohol use, unspecified with intoxication, uncomplicated: Secondary | ICD-10-CM

## 2020-01-22 DIAGNOSIS — Y92009 Unspecified place in unspecified non-institutional (private) residence as the place of occurrence of the external cause: Secondary | ICD-10-CM | POA: Diagnosis not present

## 2020-01-22 DIAGNOSIS — S0083XA Contusion of other part of head, initial encounter: Secondary | ICD-10-CM

## 2020-01-22 DIAGNOSIS — W19XXXA Unspecified fall, initial encounter: Secondary | ICD-10-CM | POA: Insufficient documentation

## 2020-01-22 DIAGNOSIS — S0181XA Laceration without foreign body of other part of head, initial encounter: Secondary | ICD-10-CM | POA: Diagnosis not present

## 2020-01-22 DIAGNOSIS — Z87891 Personal history of nicotine dependence: Secondary | ICD-10-CM | POA: Diagnosis not present

## 2020-01-22 DIAGNOSIS — S0993XA Unspecified injury of face, initial encounter: Secondary | ICD-10-CM | POA: Diagnosis present

## 2020-01-22 LAB — COMPREHENSIVE METABOLIC PANEL
ALT: 32 U/L (ref 0–44)
AST: 37 U/L (ref 15–41)
Albumin: 4.4 g/dL (ref 3.5–5.0)
Alkaline Phosphatase: 79 U/L (ref 38–126)
Anion gap: 12 (ref 5–15)
BUN: <5 mg/dL — ABNORMAL LOW (ref 8–23)
CO2: 26 mmol/L (ref 22–32)
Calcium: 9.5 mg/dL (ref 8.9–10.3)
Chloride: 104 mmol/L (ref 98–111)
Creatinine, Ser: 0.71 mg/dL (ref 0.61–1.24)
GFR calc Af Amer: >60 mL/min (ref >60)
GFR calc non Af Amer: >60 mL/min (ref >60)
Glucose, Bld: 102 mg/dL — ABNORMAL HIGH (ref 70–99)
Potassium: 3.8 mmol/L (ref 3.5–5.1)
Sodium: 142 mmol/L (ref 135–145)
Total Bilirubin: 0.6 mg/dL (ref 0.3–1.2)
Total Protein: 8.9 g/dL — ABNORMAL HIGH (ref 6.5–8.1)

## 2020-01-22 LAB — CBC
HCT: 40.8 % (ref 39.0–52.0)
Hemoglobin: 13.1 g/dL (ref 13.0–17.0)
MCH: 31 pg (ref 26.0–34.0)
MCHC: 32.1 g/dL (ref 30.0–36.0)
MCV: 96.7 fL (ref 80.0–100.0)
Platelets: 224 10*3/uL (ref 150–400)
RBC: 4.22 MIL/uL (ref 4.22–5.81)
RDW: 12.2 % (ref 11.5–15.5)
WBC: 10.3 10*3/uL (ref 4.0–10.5)
nRBC: 0 % (ref 0.0–0.2)

## 2020-01-22 LAB — ETHANOL: Alcohol, Ethyl (B): 267 mg/dL — ABNORMAL HIGH (ref <10)

## 2020-01-22 MED ORDER — LIDOCAINE-EPINEPHRINE 1 %-1:100000 IJ SOLN
10.0000 mL | Freq: Once | INTRAMUSCULAR | Status: AC
  Administered 2020-01-22: 10 mL
  Filled 2020-01-22: qty 1

## 2020-01-22 NOTE — ED Provider Notes (Signed)
[facility] EMERGENCY DEPARTMENT Provider Note   CSN: [id number] Arrival date & time: 01/22/20  2841     History No chief complaint on file.   Chase Parsons is a 64 y.o. male.  Patient presents to the emergency department with head laceration, facial abrasions sustained after apparent fall.  Patient is intoxicated.  Level 5 caveat due to alcohol intoxication.  Patient was brought to the emergency department by EMS after being found on someone's porch.  Patient is unable to give history as to what caused the fall.  Currently has no complaints.        Past Medical History:  Diagnosis Date  . Alcohol abuse   . Alcohol use disorder, moderate, dependence ([place]) 10/24/2019  . Encephalopathy   . Hyponatremia 10/24/2019  . Pneumonia   . Severe single current episode of major depressive disorder, without psychotic features ([place]) 01/26/2015  . Suicidal thoughts     Patient Active Problem List   Diagnosis Date Noted  . Diarrhea 12/06/2019  . Esophageal varices and fatty infiltration of liver seen on CT scan 12/06/2019  . Insomnia 12/06/2019  . Healthcare maintenance 12/06/2019  . GERD (gastroesophageal reflux disease) 12/06/2019  . Anemia of chronic disease 12/06/2019  . Physical deconditioning 10/24/2019  . Former consumption of alcohol 10/24/2019  . MDD (major depressive disorder), single episode, severe , no psychosis ([place]) 01/26/2015    History reviewed. No pertinent surgical history.     Family History  Problem Relation Age of Onset  . Diabetes Mother   . Hypertension Mother   . Pancreatic cancer Mother   . Alcohol abuse Father   . Diabetes Sister   . Cancer Brother   . Congestive Heart Failure Sister     Social History   Tobacco Use  . Smoking status: Former Smoker    Packs/day: 0.20    Years: 8.00    Pack years: 1.60    Start date: 1970    Quit date: 1978    Years since quitting: 43.7  . Smokeless tobacco: Never Used  Vaping Use  .  Vaping Use: Never used  Substance Use Topics  . Alcohol use: Not Currently    Alcohol/week: 6.0 standard drinks    Types: 6 Cans of beer per week    Comment: formerly alcoholic, hospitalized for withdrawal June 2021, now sober  . Drug use: No    Home Medications Prior to Admission medications   Medication Sig Start Date End Date Taking? Authorizing Provider  FLUoxetine (PROZAC) 10 MG capsule Take 1 capsule (10 mg total) by mouth daily. 12/06/19   Masoudi, Dorthula Rue, MD  folic acid (FOLVITE) 1 MG tablet Take 1 tablet (1 mg total) by mouth daily. 12/06/19 06/03/20  Masoudi, Dorthula Rue, MD  melatonin 1 MG TABS tablet Take 1 tablet (1 mg total) by mouth at bedtime. 12/13/19   Andrew Au, MD  Multiple Vitamin (MULTIVITAMIN WITH MINERALS) TABS tablet Take 1 tablet by mouth daily. 12/06/19 06/03/20  Masoudi, Dorthula Rue, MD  pantoprazole (PROTONIX) 40 MG tablet Take 1 tablet (40 mg total) by mouth daily. 12/06/19 06/03/20  Masoudi, Dorthula Rue, MD  thiamine 100 MG tablet Take 1 tablet (100 mg total) by mouth daily. 12/06/19 06/03/20  Dewayne Hatch, MD    Allergies    Patient has no known allergies.  Review of Systems   Review of Systems  Unable to perform ROS: Mental status change    Physical Exam Updated Vital Signs BP (!) 160/107 (BP Location: Right Wrist)  Pulse 88   Temp (!) 97.4 F (36.3 C) (Oral)   Resp 18   SpO2 98%   Physical Exam Vitals and nursing note reviewed.  Constitutional:      Appearance: He is well-developed.  HENT:     Head: Normocephalic. No raccoon eyes or Battle's sign.     Comments: 1cm lac to L upper eyebrow. Associated forehead abrasion. Also abrasion left upper lip.     Right Ear: Tympanic membrane, ear canal and external ear normal. No hemotympanum.     Left Ear: Tympanic membrane, ear canal and external ear normal. No hemotympanum.     Nose: Nose normal.  Eyes:     General: Lids are normal.     Conjunctiva/sclera: Conjunctivae normal.      Pupils: Pupils are equal, round, and reactive to light.     Comments: No visible hyphema  Cardiovascular:     Rate and Rhythm: Normal rate and regular rhythm.  Pulmonary:     Effort: Pulmonary effort is normal.     Breath sounds: Normal breath sounds.  Abdominal:     Palpations: Abdomen is soft.     Tenderness: There is no abdominal tenderness.  Musculoskeletal:        General: Normal range of motion.     Cervical back: Normal range of motion and neck supple. No tenderness or bony tenderness.     Thoracic back: No tenderness or bony tenderness.     Lumbar back: No tenderness or bony tenderness.  Skin:    General: Skin is warm and dry.  Neurological:     Mental Status: He is alert and oriented to person, place, and time.     GCS: GCS eye subscore is 4. GCS verbal subscore is 5. GCS motor subscore is 6.     Cranial Nerves: No cranial nerve deficit.     Sensory: No sensory deficit.     Deep Tendon Reflexes: Reflexes are normal and symmetric.     Comments: Patient speaks softly, speech slightly slurred, difficult to understand. He does speak in complete sentences but appears intoxicated.      ED Results / Procedures / Treatments   Labs (all labs ordered are listed, but only abnormal results are displayed) Labs Reviewed  COMPREHENSIVE METABOLIC PANEL - Abnormal; Notable for the following components:      Result Value   Glucose, Bld 102 (*)    BUN <5 (*)    Total Protein 8.9 (*)    All other components within normal limits  ETHANOL - Abnormal; Notable for the following components:   Alcohol, Ethyl (B) 267 (*)    All other components within normal limits  CBC    EKG None  Radiology CT Head Wo Contrast  Result Date: 01/22/2020 CLINICAL DATA:  Trauma, altered mental status EXAM: CT HEAD WITHOUT CONTRAST TECHNIQUE: Contiguous axial images were obtained from the base of the skull through the vertex without intravenous contrast. COMPARISON:  None. FINDINGS: Brain: There is  atrophy and chronic small vessel disease changes. No acute intracranial abnormality. Specifically, no hemorrhage, hydrocephalus, mass lesion, acute infarction, or significant intracranial injury. Vascular: No hyperdense vessel or unexpected calcification. Skull: No acute calvarial abnormality. Sinuses/Orbits: Small air-fluid level in the right maxillary sinus. Other: Soft tissue swelling over the left orbit and forehead. IMPRESSION: Atrophy, chronic microvascular disease. No acute intracranial abnormality. Electronically Signed   By: Rolm Baptise M.D.   On: 01/22/2020 09:54   CT Cervical Spine Wo Contrast  Result Date:  01/22/2020 CLINICAL DATA:  Neck trauma EXAM: CT CERVICAL SPINE WITHOUT CONTRAST TECHNIQUE: Multidetector CT imaging of the cervical spine was performed without intravenous contrast. Multiplanar CT image reconstructions were also generated. COMPARISON:  None. FINDINGS: Alignment: Normal Skull base and vertebrae: No acute fracture. No primary bone lesion or focal pathologic process. Soft tissues and spinal canal: No prevertebral fluid or swelling. No visible canal hematoma. Disc levels: Disc space narrowing and spurring at C5-6 and C6-7. degenerative facet disease bilaterally, left greater than right. Upper chest: No acute findings Other: None IMPRESSION: No acute bony abnormality. Spondylosis. Electronically Signed   By: Rolm Baptise M.D.   On: 01/22/2020 09:57    Procedures .Marland KitchenLaceration Repair  Date/Time: 01/22/2020 11:45 AM Performed by: Carlisle Cater, PA-C Authorized by: Carlisle Cater, PA-C   Consent:    Consent obtained:  Verbal   Consent given by:  Patient   Risks discussed:  Infection, pain and retained foreign body   Alternatives discussed:  No treatment Anesthesia (see MAR for exact dosages):    Anesthesia method:  Local infiltration   Local anesthetic:  Lidocaine 2% WITH epi Laceration details:    Location:  Face   Face location:  R eyebrow   Length (cm):  1 Repair  type:    Repair type:  Simple Pre-procedure details:    Preparation:  Patient was prepped and draped in usual sterile fashion and imaging obtained to evaluate for foreign bodies Exploration:    Hemostasis achieved with:  Epinephrine and direct pressure   Wound exploration: wound explored through full range of motion and entire depth of wound probed and visualized     Wound extent: no foreign bodies/material noted     Contaminated: no   Treatment:    Area cleansed with:  Saline   Amount of cleaning:  Extensive Skin repair:    Repair method:  Sutures   Suture size:  5-0   Wound skin closure material used: Vicryl.   Suture technique:  Simple interrupted   Number of sutures:  3 Approximation:    Approximation:  Close Post-procedure details:    Dressing:  Open (no dressing)   Patient tolerance of procedure:  Tolerated well, no immediate complications   (including critical care time)  Medications Ordered in ED Medications  lidocaine-EPINEPHrine (XYLOCAINE W/EPI) 1 %-1:100000 (with pres) injection 10 mL (10 mLs Infiltration Given 01/22/20 1125)    ED Course  I have reviewed the triage vital signs and the nursing notes.  Pertinent labs & imaging results that were available during my care of the patient were reviewed by me and considered in my medical decision making (see chart for details).  Patient seen and examined. Work-up reviewed. Upon seeing patient, I removed cervical collar. Dicussed need for stitches with patient. He agrees to proceed.   Vital signs reviewed and are as follows: BP (!) 160/107 (BP Location: Right Wrist)   Pulse 88   Temp (!) 97.4 F (36.3 C) (Oral)   Resp 18   SpO2 98%   11:46 AM Wound cleaning and repair proceeded without any difficulty.   Discussed results with patient. He is awake and alert. He is having a conversation with staff regarding how he is going to get home.   Patient was counseled on head injury precautions and symptoms that should  indicate their return to the ED.  These include severe worsening headache, vision changes, confusion, loss of consciousness, trouble walking, nausea & vomiting, or weakness/tingling in extremities.    Patient counseled on  wound care. Patient was urged to return to the Emergency Department urgently with worsening pain, swelling, expanding erythema especially if it streaks away from the affected area, fever, or if they have any other concerns. Patient verbalized understanding.     MDM Rules/Calculators/A&P                          Patient presented to the emergency department with alcohol intoxication after a fall.  He sustained a 1 cm laceration above his left eye.  Patient was evaluated with CT imaging of the head and cervical spine.  These are negative.  I have low concern for mandible fracture given no point tenderness, no malocclusion.  Patient is moving his upper and lower extremities normally.  He is appropriately conversant.  Wound was repaired without any complications.  Patient monitored emergency department for over 3 hours without any decompensation.  Will work on obtaining a safe ride home for the patient.  States he lives with sister.    Final Clinical Impression(s) / ED Diagnoses Final diagnoses:  Alcoholic intoxication without complication ([place])  Facial laceration, initial encounter  Contusion of face, initial encounter    Rx / DC Orders ED Discharge Orders    None       Carlisle Cater, PA-C 01/22/20 1152    Pattricia Boss, MD 01/26/20 1349
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
## 2020-01-22 NOTE — Discharge Instructions (Signed)
Please read and follow all provided instructions.  Your diagnoses today include:  1. Alcoholic intoxication without complication ([place])   2. Facial laceration, initial encounter   3. Contusion of face, initial encounter   4. Fall, initial encounter     Tests performed today include:  CT scan of your head and neck that did not show any serious injury. Blood cell counts - were normal Electrolytes and kidney function - were normal  Alcohol level - was high  Vital signs. See below for your results today.   Medications prescribed:   None  Take any prescribed medications only as directed.  Home care instructions:  Follow any educational materials contained in this packet.  You have 3 sutures in your left eyebrow.  These will degrade over time and can gently be pulled out from the skin in 1 to 2 weeks.  Follow-up instructions: Please follow-up with your primary care provider for further evaluation of your symptoms.   Return instructions:  SEEK IMMEDIATE MEDICAL ATTENTION IF:  There is confusion or drowsiness (although children frequently become drowsy after injury).   You cannot awaken the injured person.   You have more than one episode of vomiting.   You notice dizziness or unsteadiness which is getting worse, or inability to walk.   You have convulsions or unconsciousness.   You experience severe, persistent headaches not relieved by Tylenol.  You cannot use arms or legs normally.   There are changes in pupil sizes. (This is the black center in the colored part of the eye)   There is clear or bloody discharge from the nose or ears.   You have change in speech, vision, swallowing, or understanding.   Localized weakness, numbness, tingling, or change in bowel or bladder control.  You have any other emergent concerns.  Additional Information: You have had a head injury which does not appear to require admission at this time.  Your vital signs today were: BP (!)  160/107 (BP Location: Right Wrist)   Pulse 88   Temp (!) 97.4 F (36.3 C) (Oral)   Resp 18   SpO2 98%  If your blood pressure (BP) was elevated above 135/85 this visit, please have this repeated by your doctor within one month. --------------

## 2020-01-22 NOTE — ED Notes (Signed)
ED Provider at bedside. 

## 2020-02-13 ENCOUNTER — Other Ambulatory Visit: Payer: Self-pay | Admitting: Gastroenterology

## 2020-02-16 ENCOUNTER — Other Ambulatory Visit (HOSPITAL_COMMUNITY)
Admission: RE | Admit: 2020-02-16 | Discharge: 2020-02-16 | Disposition: A | Discharge status: Home / Self Care | Payer: Medicaid Other | Source: Ambulatory Visit | Attending: Gastroenterology | Admitting: Gastroenterology

## 2020-02-16 DIAGNOSIS — Z01812 Encounter for preprocedural laboratory examination: Secondary | ICD-10-CM | POA: Insufficient documentation

## 2020-02-16 DIAGNOSIS — Z20822 Contact with and (suspected) exposure to covid-19: Secondary | ICD-10-CM | POA: Insufficient documentation

## 2020-02-16 LAB — SARS CORONAVIRUS 2 (TAT 6-24 HRS): SARS Coronavirus 2: NEGATIVE

## 2020-02-20 ENCOUNTER — Ambulatory Visit (HOSPITAL_COMMUNITY): Payer: Medicaid Other | Admitting: Certified Registered Nurse Anesthetist

## 2020-02-20 ENCOUNTER — Other Ambulatory Visit: Payer: Self-pay

## 2020-02-20 ENCOUNTER — Encounter (HOSPITAL_COMMUNITY): Payer: Self-pay | Admitting: Gastroenterology

## 2020-02-20 ENCOUNTER — Encounter (HOSPITAL_COMMUNITY)
Admission: RE | Disposition: A | Discharge status: Home / Self Care | Payer: Self-pay | Source: Home / Self Care | Attending: Gastroenterology

## 2020-02-20 ENCOUNTER — Ambulatory Visit (HOSPITAL_COMMUNITY)
Admission: RE | Admit: 2020-02-20 | Discharge: 2020-02-20 | Disposition: A | Discharge status: Home / Self Care | Payer: Medicaid Other | Attending: Gastroenterology | Admitting: Gastroenterology

## 2020-02-20 DIAGNOSIS — K648 Other hemorrhoids: Secondary | ICD-10-CM | POA: Diagnosis not present

## 2020-02-20 DIAGNOSIS — K7290 Hepatic failure, unspecified without coma: Secondary | ICD-10-CM | POA: Insufficient documentation

## 2020-02-20 DIAGNOSIS — K5730 Diverticulosis of large intestine without perforation or abscess without bleeding: Secondary | ICD-10-CM | POA: Diagnosis not present

## 2020-02-20 DIAGNOSIS — K3189 Other diseases of stomach and duodenum: Secondary | ICD-10-CM | POA: Insufficient documentation

## 2020-02-20 DIAGNOSIS — I10 Essential (primary) hypertension: Secondary | ICD-10-CM | POA: Diagnosis not present

## 2020-02-20 DIAGNOSIS — K625 Hemorrhage of anus and rectum: Secondary | ICD-10-CM | POA: Diagnosis not present

## 2020-02-20 DIAGNOSIS — K7460 Unspecified cirrhosis of liver: Secondary | ICD-10-CM | POA: Insufficient documentation

## 2020-02-20 DIAGNOSIS — K317 Polyp of stomach and duodenum: Secondary | ICD-10-CM | POA: Diagnosis not present

## 2020-02-20 DIAGNOSIS — K2950 Unspecified chronic gastritis without bleeding: Secondary | ICD-10-CM | POA: Diagnosis not present

## 2020-02-20 DIAGNOSIS — K219 Gastro-esophageal reflux disease without esophagitis: Secondary | ICD-10-CM | POA: Diagnosis not present

## 2020-02-20 DIAGNOSIS — Z5902 Unsheltered homelessness: Secondary | ICD-10-CM | POA: Insufficient documentation

## 2020-02-20 DIAGNOSIS — K766 Portal hypertension: Secondary | ICD-10-CM | POA: Diagnosis not present

## 2020-02-20 DIAGNOSIS — Z87891 Personal history of nicotine dependence: Secondary | ICD-10-CM | POA: Diagnosis not present

## 2020-02-20 DIAGNOSIS — D124 Benign neoplasm of descending colon: Secondary | ICD-10-CM | POA: Insufficient documentation

## 2020-02-20 HISTORY — PX: COLONOSCOPY WITH PROPOFOL: SHX5780

## 2020-02-20 HISTORY — PX: BIOPSY: SHX5522

## 2020-02-20 HISTORY — PX: ESOPHAGOGASTRODUODENOSCOPY (EGD) WITH PROPOFOL: SHX5813

## 2020-02-20 HISTORY — PX: POLYPECTOMY: SHX5525

## 2020-02-20 SURGERY — ESOPHAGOGASTRODUODENOSCOPY (EGD) WITH PROPOFOL
Anesthesia: Monitor Anesthesia Care

## 2020-02-20 MED ORDER — PHENYLEPHRINE 40 MCG/ML (10ML) SYRINGE FOR IV PUSH (FOR BLOOD PRESSURE SUPPORT)
PREFILLED_SYRINGE | INTRAVENOUS | Status: DC | PRN
  Administered 2020-02-20: 80 ug via INTRAVENOUS
  Administered 2020-02-20: 120 ug via INTRAVENOUS

## 2020-02-20 MED ORDER — PROPOFOL 1000 MG/100ML IV EMUL
INTRAVENOUS | Status: AC
  Filled 2020-02-20: qty 200

## 2020-02-20 MED ORDER — PROPOFOL 500 MG/50ML IV EMUL
INTRAVENOUS | Status: DC | PRN
  Administered 2020-02-20: 150 ug/kg/min via INTRAVENOUS

## 2020-02-20 MED ORDER — MIDAZOLAM HCL (PF) 5 MG/ML IJ SOLN
1.0000 mg | Freq: Once | INTRAMUSCULAR | Status: AC
  Administered 2020-02-20: 1 mg via INTRAVENOUS

## 2020-02-20 MED ORDER — MIDAZOLAM HCL (PF) 5 MG/ML IJ SOLN
INTRAMUSCULAR | Status: AC
  Filled 2020-02-20: qty 1

## 2020-02-20 MED ORDER — HYDRALAZINE HCL 20 MG/ML IJ SOLN
INTRAMUSCULAR | Status: DC | PRN
  Administered 2020-02-20: 10 mg via INTRAVENOUS

## 2020-02-20 MED ORDER — PROPOFOL 500 MG/50ML IV EMUL
INTRAVENOUS | Status: AC
  Filled 2020-02-20: qty 150

## 2020-02-20 MED ORDER — PROPOFOL 10 MG/ML IV BOLUS
INTRAVENOUS | Status: DC | PRN
  Administered 2020-02-20 (×3): 30 mg via INTRAVENOUS
  Administered 2020-02-20 (×2): 50 mg via INTRAVENOUS

## 2020-02-20 MED ORDER — LIDOCAINE 2% (20 MG/ML) 5 ML SYRINGE
INTRAMUSCULAR | Status: DC | PRN
  Administered 2020-02-20: 80 mg via INTRAVENOUS

## 2020-02-20 MED ORDER — MIDAZOLAM HCL (PF) 10 MG/2ML IJ SOLN: 1.0000 mg | Freq: Once | INTRAMUSCULAR | Status: DC

## 2020-02-20 MED ORDER — LACTATED RINGERS IV SOLN: INTRAVENOUS | Status: DC | PRN

## 2020-02-20 SURGICAL SUPPLY — 24 items

## 2020-02-20 NOTE — Anesthesia Postprocedure Evaluation (Signed)
Anesthesia Post Note  Patient: Chase Parsons  Procedure(s) Performed: ESOPHAGOGASTRODUODENOSCOPY (EGD) WITH PROPOFOL (N/A ) COLONOSCOPY WITH PROPOFOL (N/A ) BIOPSY POLYPECTOMY     Patient location during evaluation: Endoscopy Anesthesia Type: MAC Level of consciousness: awake and alert, patient cooperative and oriented Pain management: pain level controlled Vital Signs Assessment: post-procedure vital signs reviewed and stable Respiratory status: spontaneous breathing, nonlabored ventilation and respiratory function stable Cardiovascular status: blood pressure returned to baseline and stable Postop Assessment: no apparent nausea or vomiting and able to ambulate Anesthetic complications: no   No complications documented.  Last Vitals:  Vitals:   02/20/20 0940 02/20/20 0950  BP: (!) 164/120 (!) 161/113  Pulse: (!) 124 (!) 112  Resp: 19 (!) 22  Temp:    SpO2: 99% 98%    Last Pain:  Vitals:   02/20/20 0950  TempSrc:   PainSc: 0-No pain                 Ingrid Shifrin,E. Lasean Gorniak
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
## 2020-02-20 NOTE — Transfer of Care (Signed)
Immediate Anesthesia Transfer of Care Note  Patient: Gaege Sangalang  Procedure(s) Performed: ESOPHAGOGASTRODUODENOSCOPY (EGD) WITH PROPOFOL (N/A ) COLONOSCOPY WITH PROPOFOL (N/A ) BIOPSY POLYPECTOMY  Patient Location: Endoscopy Unit  Anesthesia Type:MAC  Level of Consciousness: awake, alert  and oriented  Airway & Oxygen Therapy: Patient Spontanous Breathing and Patient connected to face mask oxygen  Post-op Assessment: Report given to RN, Post -op Vital signs reviewed and stable and Patient moving all extremities  Post vital signs: Reviewed and stable  Last Vitals:  Vitals Value Taken Time  BP 153/107 02/20/20 0935  Temp    Pulse 124 02/20/20 0939  Resp 19 02/20/20 0939  SpO2 82 % 02/20/20 0939  Vitals shown include unvalidated device data.  Last Pain:  Vitals:   02/20/20 0935  TempSrc:   PainSc: 0-No pain         Complications: No complications documented.

## 2020-02-20 NOTE — Op Note (Addendum)
[facility] Patient Name: Chase Parsons Procedure Date: 02/20/2020 MRN: [id number] Attending MD: Otis Brace , MD Date of Birth: 19-May-1955 CSN: [id number] Age: 64 Admit Type: Outpatient Procedure:                Upper GI endoscopy Indications:              Cirrhosis rule out esophageal varices Providers:                Otis Brace, MD, Particia Nearing, RN, Ladona Ridgel, Technician, Wynonia Sours, RN Referring MD:              Medicines:                Sedation Administered by an Anesthesia Professional Complications:            No immediate complications. Estimated Blood Loss:     Estimated blood loss was minimal. Procedure:                Pre-Anesthesia Assessment:                           - Prior to the procedure, a History and Physical                            was performed, and patient medications and                            allergies were reviewed. The patient's tolerance of                            previous anesthesia was also reviewed. The risks                            and benefits of the procedure and the sedation                            options and risks were discussed with the patient.                            All questions were answered, and informed consent                            was obtained. Prior Anticoagulants: The patient has                            taken no previous anticoagulant or antiplatelet                            agents. ASA Grade Assessment: III - A patient with                            severe systemic disease. After reviewing the risks  and benefits, the patient was deemed in                            satisfactory condition to undergo the procedure.                           After obtaining informed consent, the endoscope was                            passed under direct vision. Throughout the                            procedure, the patient's  blood pressure, pulse, and                            oxygen saturations were monitored continuously. The                            GIF-H190 ([id number] Olympus gastroscope was                            introduced through the mouth, and advanced to the                            second part of duodenum. The upper GI endoscopy was                            accomplished without difficulty. The patient                            tolerated the procedure well. Scope In: Scope Out: Findings:      The Z-line was regular and was found 40 cm from the incisors.      There is no endoscopic evidence of varices in the entire esophagus.      Moderate portal hypertensive gastropathy was found in the gastric body.       Biopsies were taken with a cold forceps for histology.      The cardia and gastric fundus were normal on retroflexion.      A single 3 mm sessile polyp was found in the gastric antrum. The polyp       was removed with a cold biopsy forceps. Resection and retrieval were       complete.      The duodenal bulb, first portion of the duodenum and second portion of       the duodenum were normal. Impression:               - Z-line regular, 40 cm from the incisors.                           - Portal hypertensive gastropathy. Biopsied.                           - A single gastric polyp. Resected and retrieved.                           -  Normal duodenal bulb, first portion of the                            duodenum and second portion of the duodenum. Moderate Sedation:      Moderate (conscious) sedation was personally administered by an       anesthesia professional. The following parameters were monitored: oxygen       saturation, heart rate, blood pressure, and response to care. Recommendation:           - Patient has a contact number available for                            emergencies. The signs and symptoms of potential                            delayed complications were discussed with  the                            patient. Return to normal activities tomorrow.                            Written discharge instructions were provided to the                            patient.                           - Resume previous diet.                           - Continue present medications.                           - Await pathology results.                           - Repeat upper endoscopy after studies are complete                            for surveillance based on pathology results. Procedure Code(s):        --- Professional ---                           [phone], Esophagogastroduodenoscopy, flexible,                            transoral; with biopsy, single or multiple Diagnosis Code(s):        --- Professional ---                           K76.6, Portal hypertension                           K31.89, Other diseases of stomach and duodenum                           K31.7, Polyp  of stomach and duodenum                           K74.60, Unspecified cirrhosis of liver CPT copyright 2019 American [employer] Association. All rights reserved. The codes documented in this report are preliminary and upon coder review may  be revised to meet current compliance requirements. Otis Brace, MD Otis Brace, MD 02/20/2020 9:38:39 AM Number of Addenda: 0

## 2020-02-20 NOTE — Op Note (Addendum)
[facility] Patient Name: Chase Parsons Procedure Date: 02/20/2020 MRN: [id number] Attending MD: Otis Brace , MD Date of Birth: 09/29/1955 CSN: [id number] Age: 64 Admit Type: Outpatient Procedure:                Colonoscopy Indications:              This is the patient's first colonoscopy, Rectal                            bleeding Providers:                Otis Brace, MD, Particia Nearing, RN, Ladona Ridgel, Technician, Wynonia Sours, RN Referring MD:              Medicines:                Sedation Administered by an Anesthesia Professional Complications:            No immediate complications. Estimated Blood Loss:     Estimated blood loss was minimal. Procedure:                Pre-Anesthesia Assessment:                           - Prior to the procedure, a History and Physical                            was performed, and patient medications and                            allergies were reviewed. The patient's tolerance of                            previous anesthesia was also reviewed. The risks                            and benefits of the procedure and the sedation                            options and risks were discussed with the patient.                            All questions were answered, and informed consent                            was obtained. Prior Anticoagulants: The patient has                            taken no previous anticoagulant or antiplatelet                            agents. ASA Grade Assessment: III - A patient with  severe systemic disease. After reviewing the risks                            and benefits, the patient was deemed in                            satisfactory condition to undergo the procedure.                           - Prior to the procedure, a History and Physical                            was performed, and patient medications and                             allergies were reviewed. The patient's tolerance of                            previous anesthesia was also reviewed. The risks                            and benefits of the procedure and the sedation                            options and risks were discussed with the patient.                            All questions were answered, and informed consent                            was obtained. Prior Anticoagulants: The patient has                            taken no previous anticoagulant or antiplatelet                            agents. ASA Grade Assessment: III - A patient with                            severe systemic disease. After reviewing the risks                            and benefits, the patient was deemed in                            satisfactory condition to undergo the procedure.                           After obtaining informed consent, the colonoscope                            was passed under direct vision. Throughout the  procedure, the patient's blood pressure, pulse, and                            oxygen saturations were monitored continuously. The                            PCF-H190DL ([id number] Olympus pediatric colonscope                            was introduced through the anus and advanced to the                            the cecum, identified by appendiceal orifice and                            ileocecal valve. The colonoscopy was performed                            without difficulty. The patient tolerated the                            procedure well. The quality of the bowel                            preparation was good except the ascending colon was                            fair. Scope In: 9:08:29 AM Scope Out: 9:28:28 AM Scope Withdrawal Time: 0 hours 14 minutes 3 seconds  Total Procedure Duration: 0 hours 19 minutes 59 seconds  Findings:      The perianal and digital rectal examinations were normal. The quality  of       the bowel preparation was good except the ascending colon was fair.      A 10 mm polyp was found in the descending colon. The polyp was       pedunculated. The polyp was removed with a piecemeal technique using a       hot snare. Resection and retrieval were complete.      Multiple small-mouthed diverticula were found in the sigmoid colon and       descending colon.      Internal hemorrhoids were found during retroflexion. The hemorrhoids       were medium-sized. Impression:               - One 10 mm polyp in the descending colon, removed                            piecemeal using a hot snare. Resected and retrieved.                           - Diverticulosis in the sigmoid colon and in the                            descending colon.                           -  Internal hemorrhoids. Moderate Sedation:      Moderate (conscious) sedation was personally administered by an       anesthesia professional. The following parameters were monitored: oxygen       saturation, heart rate, blood pressure, and response to care. Recommendation:           - Patient has a contact number available for                            emergencies. The signs and symptoms of potential                            delayed complications were discussed with the                            patient. Return to normal activities tomorrow.                            Written discharge instructions were provided to the                            patient.                           - Resume previous diet.                           - Continue present medications.                           - Await pathology results.                           - Repeat colonoscopy in 3 - 5 years for                            surveillance based on pathology results.                           - Return to my office as previously scheduled. Procedure Code(s):        --- Professional ---                           [phone], Colonoscopy, flexible;  with removal of                            tumor(s), polyp(s), or other lesion(s) by snare                            technique Diagnosis Code(s):        --- Professional ---                           K63.5, Polyp of colon                           K64.8, Other hemorrhoids  K62.5, Hemorrhage of anus and rectum                           K57.30, Diverticulosis of large intestine without                            perforation or abscess without bleeding CPT copyright 2019 American Medical Association. All rights reserved. The codes documented in this report are preliminary and upon coder review may  be revised to meet current compliance requirements. Otis Brace, MD Otis Brace, MD 02/20/2020 9:44:11 AM Number of Addenda: 0

## 2020-02-20 NOTE — Anesthesia Preprocedure Evaluation (Addendum)
Anesthesia Evaluation  Patient identified by MRN, date of birth, ID band Patient awake    Reviewed: Allergy & Precautions, NPO status , Patient's Chart, lab work & pertinent test results  History of Anesthesia Complications Negative for: history of anesthetic complications  Airway Mallampati: I  TM Distance: >3 FB Neck ROM: Full    Dental  (+) Dental Advisory Given   Pulmonary former smoker,  02/16/2020 SARS coronavirus NEG   breath sounds clear to auscultation       Cardiovascular negative cardio ROS   Rhythm:Regular Rate:Normal     Neuro/Psych Depression negative neurological ROS     GI/Hepatic GERD  Medicated and Controlled,(+) Cirrhosis   Esophageal Varices  substance abuse  alcohol use,   Endo/Other  negative endocrine ROS  Renal/GU negative Renal ROS     Musculoskeletal   Abdominal   Peds  Hematology negative hematology ROS (+)   Anesthesia Other Findings   Reproductive/Obstetrics                            Anesthesia Physical Anesthesia Plan  ASA: III  Anesthesia Plan: MAC   Post-op Pain Management:    Induction:   PONV Risk Score and Plan: 1 and Ondansetron and Treatment may vary due to age or medical condition  Airway Management Planned: Natural Airway and Nasal Cannula  Additional Equipment: None  Intra-op Plan:   Post-operative Plan:   Informed Consent: I have reviewed the patients History and Physical, chart, labs and discussed the procedure including the risks, benefits and alternatives for the proposed anesthesia with the patient or authorized representative who has indicated his/her understanding and acceptance.     Dental advisory given  Plan Discussed with: CRNA and Surgeon  Anesthesia Plan Comments: (Discussed with patient and Dr. Cheyenne Adas, pt is indigent, no availability for HTN meds Given cirrhosis with varices and risk of bleeding will proceed  with conservative screening and Dr. Cheyenne Adas to arrange care for HTN, treat and return for banding once BP better controlled)       Anesthesia Quick Evaluation

## 2020-02-20 NOTE — Discharge Instructions (Signed)

## 2020-02-21 ENCOUNTER — Other Ambulatory Visit: Payer: Self-pay

## 2020-02-21 ENCOUNTER — Encounter (HOSPITAL_COMMUNITY): Payer: Self-pay | Admitting: Gastroenterology

## 2020-02-21 LAB — SURGICAL PATHOLOGY

## 2020-02-22 ENCOUNTER — Other Ambulatory Visit: Payer: Self-pay | Admitting: Physician Assistant

## 2020-02-22 ENCOUNTER — Ambulatory Visit
Admission: RE | Admit: 2020-02-22 | Discharge: 2020-02-22 | Disposition: A | Discharge status: Home / Self Care | Payer: Medicaid Other | Source: Ambulatory Visit | Attending: Physician Assistant | Admitting: Physician Assistant

## 2020-02-22 DIAGNOSIS — R1084 Generalized abdominal pain: Secondary | ICD-10-CM

## 2020-02-22 MED ORDER — IOPAMIDOL (ISOVUE-300) INJECTION 61%
100.0000 mL | Freq: Once | INTRAVENOUS | Status: AC | PRN
  Administered 2020-02-22: 100 mL via INTRAVENOUS

## 2020-02-29 ENCOUNTER — Ambulatory Visit (INDEPENDENT_AMBULATORY_CARE_PROVIDER_SITE_OTHER): Payer: Medicaid Other | Admitting: Student

## 2020-02-29 ENCOUNTER — Other Ambulatory Visit: Payer: Self-pay

## 2020-02-29 ENCOUNTER — Encounter: Payer: Self-pay | Admitting: Student

## 2020-02-29 VITALS — BP 163/97 | HR 73 | Temp 97.8°F | Wt 161.9 lb

## 2020-02-29 DIAGNOSIS — I8510 Secondary esophageal varices without bleeding: Secondary | ICD-10-CM

## 2020-02-29 DIAGNOSIS — D638 Anemia in other chronic diseases classified elsewhere: Secondary | ICD-10-CM | POA: Diagnosis not present

## 2020-02-29 DIAGNOSIS — Z23 Encounter for immunization: Secondary | ICD-10-CM | POA: Diagnosis not present

## 2020-02-29 DIAGNOSIS — Z0000 Encounter for general adult medical examination without abnormal findings: Secondary | ICD-10-CM

## 2020-02-29 DIAGNOSIS — K7030 Alcoholic cirrhosis of liver without ascites: Secondary | ICD-10-CM

## 2020-02-29 DIAGNOSIS — I10 Essential (primary) hypertension: Secondary | ICD-10-CM | POA: Insufficient documentation

## 2020-02-29 DIAGNOSIS — F5101 Primary insomnia: Secondary | ICD-10-CM

## 2020-02-29 DIAGNOSIS — F322 Major depressive disorder, single episode, severe without psychotic features: Secondary | ICD-10-CM | POA: Diagnosis not present

## 2020-02-29 DIAGNOSIS — F1010 Alcohol abuse, uncomplicated: Secondary | ICD-10-CM | POA: Diagnosis not present

## 2020-02-29 DIAGNOSIS — K219 Gastro-esophageal reflux disease without esophagitis: Secondary | ICD-10-CM | POA: Diagnosis not present

## 2020-02-29 DIAGNOSIS — R5381 Other malaise: Secondary | ICD-10-CM

## 2020-02-29 LAB — PROTIME-INR
INR: 1.1 (ref 0.8–1.2)
Prothrombin Time: 13.6 seconds (ref 11.4–15.2)

## 2020-02-29 NOTE — Assessment & Plan Note (Signed)
Reports this has resolved and now sleeps well throughout the night.  Not using melatonin  -discontinue melatonin

## 2020-02-29 NOTE — Assessment & Plan Note (Signed)
Abdominal pain has resolved  -continue Protonix 40 mg daily

## 2020-02-29 NOTE — Assessment & Plan Note (Signed)
Recent ED visit for a fall after alcohol use.  Presented to ED had negative head CT.  Start outpatient physical therapy but has not been back because he feels the strength is better.  On exam strength and balance are much improved compared to prior

## 2020-02-29 NOTE — Patient Instructions (Signed)
Thank you for allowing [place] to be a part of your care today, it was a pleasure seeing you. We discussed your alcohol use, liver disease, sleep, and hypertension.  I am checking these labs: CMP, PT/INR, CBC  I have made these changes to your medications: None  Your blood pressure was high today, though it has been normal in the past. Since you're reporting normal levels at home, please check your pressure at home and bring a log with you at your next visit.  I am glad to see you looking so much better! Your strength and mental function seem to be continuing to improve.   Please follow up in 6 months   Thank you, and please call the Internal Medicine Clinic at [phone] if you have any questions.  Best, Dr. Bridgett Larsson

## 2020-02-29 NOTE — Assessment & Plan Note (Addendum)
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  Followed by GI.   -continue rifaximin -continue GI follow up -Check CBC, CMP, PT/INR  Addendum: benign CMP, PT/INT, and CBC with very mild anemia. Continue follow up with GI.

## 2020-02-29 NOTE — Assessment & Plan Note (Addendum)
Reports he is doing much better has not had any change in the past 2 or 3 weeks.  Of note had an ED visit in September for fall 2/2 alcohol use.  His sister is helping him maintain sobriety.  Declines any assistance from [place] at this time.  -continue vitamin B1, folic acid, and multivitamin

## 2020-02-29 NOTE — Assessment & Plan Note (Addendum)
BP: (!) 163/97 Previously had low or normal blood pressures, but in the past few months has had elevated blood pressures on various visits in our system.  Reports normal BP at home but unable to specify numbers.  Notes significant anxiety around doctor visits.  Denies chest pain, palpitations, dizziness, headaches, LOC.    -Provided blood pressure log, consider blood pressure medication at next visit

## 2020-02-29 NOTE — Assessment & Plan Note (Signed)
-  Colonoscopy on 02/20/2020 with 10 mm polyp which showed tubular adenoma no high-grade dysplasia or malignancy -Flu vaccine administered

## 2020-02-29 NOTE — Assessment & Plan Note (Signed)
Endoscopy on 10/26 without esophageal varices, though they were suspected on prior CT scan. Portal hypertensive gastropathy was noted and a single gastric polyp. On path had no H pylori or metaplasia.

## 2020-02-29 NOTE — Assessment & Plan Note (Signed)
Denies depression but has somewhat flat affect and avoids eye contact.  However this is improved compared to previous  -Continue fluoxetine 10 mg daily

## 2020-02-29 NOTE — Progress Notes (Signed)
   CC: Hypertension, cirrhosis, depression, insomnia, physical deconditioning, alcohol use disorder.  HPI:  Chase Parsons is a 64 y.o. male with history as below presenting for follow-up on the above. Please refer to problem based charting for further details of assessment and plan of current problem and chronic medical conditions.  Past Medical History:  Diagnosis Date  . Alcohol abuse   . Alcohol use disorder, moderate, dependence ([place]) 10/24/2019  . Encephalopathy   . Hyponatremia 10/24/2019  . Pneumonia   . Severe single current episode of major depressive disorder, without psychotic features ([place]) 01/26/2015  . Suicidal thoughts    Review of Systems:   Review of Systems  Constitutional: Negative for chills, fever and weight loss.  Respiratory: Negative for cough and shortness of breath.   Cardiovascular: Negative for chest pain and palpitations.  Gastrointestinal: Negative for abdominal pain, blood in stool, constipation, diarrhea, melena, nausea and vomiting.  Neurological: Positive for dizziness. Negative for weakness.  Psychiatric/Behavioral: Negative for depression.  All other systems reviewed and are negative.  Physical Exam: Vitals:   02/29/20 0845  BP: (!) 168/100  Pulse: 72  Temp: 97.8 F (36.6 C)  TempSrc: Oral  SpO2: 100%  Weight: 161 lb 14.4 oz (73.4 kg)   Constitutional: no acute distress Head: atraumatic ENT: external ears normal Cardiovascular: regular rate and rhythm, normal heart sounds Pulmonary: effort normal, normal breath sounds bilaterally Abdominal: flat, nontender, no rebound tenderness, bowel sounds normal Skin: warm and dry Neurological: alert, no focal deficit, gait normal, 5/5 strength Psychiatric: flat affect but made a joke, avoids eye contact somewhat  Assessment & Plan:   See Encounters Tab for problem based charting.  Patient seen with Dr. Philipp Ovens

## 2020-03-01 LAB — CMP14 + ANION GAP
ALT: 21 IU/L (ref 0–44)
AST: 19 IU/L (ref 0–40)
Albumin/Globulin Ratio: 1.3 (ref 1.2–2.2)
Albumin: 4.7 g/dL (ref 3.8–4.8)
Alkaline Phosphatase: 112 IU/L (ref 44–121)
Anion Gap: 17 mmol/L (ref 10.0–18.0)
BUN/Creatinine Ratio: 8 — ABNORMAL LOW (ref 10–24)
BUN: 6 mg/dL — ABNORMAL LOW (ref 8–27)
Bilirubin Total: 0.5 mg/dL (ref 0.0–1.2)
CO2: 25 mmol/L (ref 20–29)
Calcium: 9.8 mg/dL (ref 8.6–10.2)
Chloride: 101 mmol/L (ref 96–106)
Creatinine, Ser: 0.74 mg/dL — ABNORMAL LOW (ref 0.76–1.27)
GFR calc Af Amer: 113 mL/min/{1.73_m2} (ref >59)
GFR calc non Af Amer: 97 mL/min/{1.73_m2} (ref >59)
Globulin, Total: 3.5 g/dL (ref 1.5–4.5)
Glucose: 93 mg/dL (ref 65–99)
Potassium: 4.1 mmol/L (ref 3.5–5.2)
Sodium: 143 mmol/L (ref 134–144)
Total Protein: 8.2 g/dL (ref 6.0–8.5)

## 2020-03-01 LAB — CBC WITH DIFFERENTIAL/PLATELET
Basophils Absolute: 0 10*3/uL (ref 0.0–0.2)
Basos: 1 %
EOS (ABSOLUTE): 0.1 10*3/uL (ref 0.0–0.4)
Eos: 2 %
Hematocrit: 39.8 % (ref 37.5–51.0)
Hemoglobin: 12.9 g/dL — ABNORMAL LOW (ref 13.0–17.7)
Immature Grans (Abs): 0 10*3/uL (ref 0.0–0.1)
Immature Granulocytes: 0 %
Lymphocytes Absolute: 2.1 10*3/uL (ref 0.7–3.1)
Lymphs: 34 %
MCH: 31.1 pg (ref 26.6–33.0)
MCHC: 32.4 g/dL (ref 31.5–35.7)
MCV: 96 fL (ref 79–97)
Monocytes Absolute: 0.8 10*3/uL (ref 0.1–0.9)
Monocytes: 12 %
Neutrophils Absolute: 3.3 10*3/uL (ref 1.4–7.0)
Neutrophils: 51 %
Platelets: 217 10*3/uL (ref 150–450)
RBC: 4.15 x10E6/uL (ref 4.14–5.80)
RDW: 11.9 % (ref 11.6–15.4)
WBC: 6.3 10*3/uL (ref 3.4–10.8)

## 2020-03-01 NOTE — Assessment & Plan Note (Signed)
Anemia improved and stable with Hgb of 12.9. Had recent GI bleed with colonoscopy which found a 91mm polyp which was benign on pathology. No more GI bleeds since then.

## 2020-03-06 NOTE — Progress Notes (Signed)
[facility] Attending  I saw and evaluated the patient.  I personally confirmed the key portions of the history and exam documented by Dr. Chen and I reviewed pertinent patient test results.  The assessment, diagnosis, and plan were formulated together and I agree with the documentation in the resident's note.  

## 2020-05-13 ENCOUNTER — Emergency Department (HOSPITAL_COMMUNITY): Payer: Medicaid Other

## 2020-05-13 ENCOUNTER — Encounter (HOSPITAL_COMMUNITY): Payer: Self-pay

## 2020-05-13 ENCOUNTER — Emergency Department (HOSPITAL_COMMUNITY)
Admission: EM | Admit: 2020-05-13 | Discharge: 2020-05-13 | Disposition: A | Discharge status: Home / Self Care | Payer: Medicaid Other | Attending: Emergency Medicine | Admitting: Emergency Medicine

## 2020-05-13 ENCOUNTER — Other Ambulatory Visit: Payer: Self-pay

## 2020-05-13 DIAGNOSIS — I10 Essential (primary) hypertension: Secondary | ICD-10-CM | POA: Insufficient documentation

## 2020-05-13 DIAGNOSIS — M5489 Other dorsalgia: Secondary | ICD-10-CM | POA: Diagnosis not present

## 2020-05-13 DIAGNOSIS — Z20822 Contact with and (suspected) exposure to covid-19: Secondary | ICD-10-CM | POA: Diagnosis not present

## 2020-05-13 DIAGNOSIS — R079 Chest pain, unspecified: Secondary | ICD-10-CM | POA: Diagnosis not present

## 2020-05-13 DIAGNOSIS — M549 Dorsalgia, unspecified: Secondary | ICD-10-CM

## 2020-05-13 DIAGNOSIS — R072 Precordial pain: Secondary | ICD-10-CM | POA: Diagnosis present

## 2020-05-13 LAB — COMPREHENSIVE METABOLIC PANEL
ALT: 24 U/L (ref 0–44)
AST: 27 U/L (ref 15–41)
Albumin: 4.2 g/dL (ref 3.5–5.0)
Alkaline Phosphatase: 96 U/L (ref 38–126)
Anion gap: 12 (ref 5–15)
BUN: 5 mg/dL — ABNORMAL LOW (ref 8–23)
CO2: 28 mmol/L (ref 22–32)
Calcium: 9.5 mg/dL (ref 8.9–10.3)
Chloride: 98 mmol/L (ref 98–111)
Creatinine, Ser: 0.79 mg/dL (ref 0.61–1.24)
GFR, Estimated: >60 mL/min (ref >60)
Glucose, Bld: 95 mg/dL (ref 70–99)
Potassium: 3.5 mmol/L (ref 3.5–5.1)
Sodium: 138 mmol/L (ref 135–145)
Total Bilirubin: 1 mg/dL (ref 0.3–1.2)
Total Protein: 8.3 g/dL — ABNORMAL HIGH (ref 6.5–8.1)

## 2020-05-13 LAB — LIPASE, BLOOD: Lipase: 21 U/L (ref 11–51)

## 2020-05-13 LAB — CBC WITH DIFFERENTIAL/PLATELET
Abs Immature Granulocytes: 0.03 10*3/uL (ref 0.00–0.07)
Basophils Absolute: 0 10*3/uL (ref 0.0–0.1)
Basophils Relative: 0 %
Eosinophils Absolute: 0.1 10*3/uL (ref 0.0–0.5)
Eosinophils Relative: 1 %
HCT: 41.1 % (ref 39.0–52.0)
Hemoglobin: 14.1 g/dL (ref 13.0–17.0)
Immature Granulocytes: 0 %
Lymphocytes Relative: 31 %
Lymphs Abs: 2.6 10*3/uL (ref 0.7–4.0)
MCH: 33.4 pg (ref 26.0–34.0)
MCHC: 34.3 g/dL (ref 30.0–36.0)
MCV: 97.4 fL (ref 80.0–100.0)
Monocytes Absolute: 0.7 10*3/uL (ref 0.1–1.0)
Monocytes Relative: 9 %
Neutro Abs: 4.8 10*3/uL (ref 1.7–7.7)
Neutrophils Relative %: 59 %
Platelets: 302 10*3/uL (ref 150–400)
RBC: 4.22 MIL/uL (ref 4.22–5.81)
RDW: 12.2 % (ref 11.5–15.5)
WBC: 8.2 10*3/uL (ref 4.0–10.5)
nRBC: 0 % (ref 0.0–0.2)

## 2020-05-13 LAB — TROPONIN I (HIGH SENSITIVITY)
Troponin I (High Sensitivity): 5 ng/L (ref <18)
Troponin I (High Sensitivity): 5 ng/L (ref <18)

## 2020-05-13 LAB — RESP PANEL BY RT-PCR (FLU A&B, COVID) ARPGX2
Influenza A by PCR: NEGATIVE
Influenza B by PCR: NEGATIVE
SARS Coronavirus 2 by RT PCR: NEGATIVE

## 2020-05-13 LAB — LACTIC ACID, PLASMA: Lactic Acid, Venous: 2 mmol/L (ref 0.5–1.9)

## 2020-05-13 MED ORDER — SODIUM CHLORIDE 0.9 % IV BOLUS
500.0000 mL | Freq: Once | INTRAVENOUS | Status: AC
  Administered 2020-05-13: 500 mL via INTRAVENOUS

## 2020-05-13 MED ORDER — IOHEXOL 350 MG/ML SOLN
80.0000 mL | Freq: Once | INTRAVENOUS | Status: AC | PRN
  Administered 2020-05-13: 80 mL via INTRAVENOUS

## 2020-05-13 NOTE — ED Triage Notes (Signed)
Pt arrived to ED via EMS from home w/ c/o mid upper back pain that radiates straight through to central chest, described as achey. Onset: 1130 at rest. Movement makes pain worse. No cardiac hx. Does not see a PCP.   VS: 210/130 manual, HR 88 NSR, O2 99% RA, RR 16, CBG 107, Temp 97.5  324mg  ASA and 1 SL nitro given, CP went away, but back pain didn't alleviate.   IV 20g L AC

## 2020-05-13 NOTE — ED Notes (Signed)
Patient transported to CT 

## 2020-05-13 NOTE — ED Provider Notes (Signed)
[facility]ER[facility] DEPARTMENT Provider Note   CSN: [id number] Arrival date & time: 05/13/20  1252     History Chief Complaint  Patient presents with  . Back Pain    Radiating into chest    Chase Parsons is a 65 y.o. male.  The history is provided by the patient and medical records. No language interpreter was used.  Chest Pain Pain location:  Substernal area and epigastric Pain quality: aching   Pain radiates to:  Upper back Pain severity:  Severe Onset quality:  Gradual Duration:  1 day Timing:  Constant Progression:  Unchanged Relieved by:  Nitroglycerin Worsened by:  Nothing Associated symptoms: no abdominal pain, no altered mental status, no back pain, no claudication, no cough, no fatigue, no fever, no headache, no lower extremity edema, no nausea, no numbness, no shortness of breath, no vomiting and no weakness   Risk factors: hypertension and male sex        Past Medical History:  Diagnosis Date  . Alcohol abuse   . Alcohol use disorder, moderate, dependence ([place]) 10/24/2019  . Encephalopathy   . Hyponatremia 10/24/2019  . Pneumonia   . Severe single current episode of major depressive disorder, without psychotic features ([place]) 01/26/2015  . Suicidal thoughts     Patient Active Problem List   Diagnosis Date Noted  . Alcoholic cirrhosis ([place]) 40/01/2724  . Hypertension 02/29/2020  . Diarrhea 12/06/2019  . Insomnia 12/06/2019  . Healthcare maintenance 12/06/2019  . GERD (gastroesophageal reflux disease) 12/06/2019  . Anemia of chronic disease 12/06/2019  . Physical deconditioning 10/24/2019  . Alcohol use disorder, mild, in controlled environment 10/24/2019  . MDD (major depressive disorder), single episode, severe , no psychosis ([place]) 01/26/2015    Past Surgical History:  Procedure Laterality Date  . BIOPSY  02/20/2020   Procedure: BIOPSY;  Surgeon: Otis Brace, MD;  Location: [facility];  Service: Gastroenterology;;   EGD and COLON  . COLONOSCOPY WITH PROPOFOL N/A 02/20/2020   Procedure: COLONOSCOPY WITH PROPOFOL;  Surgeon: Otis Brace, MD;  Location: [facility];  Service: Gastroenterology;  Laterality: N/A;  . ESOPHAGOGASTRODUODENOSCOPY (EGD) WITH PROPOFOL N/A 02/20/2020   Procedure: ESOPHAGOGASTRODUODENOSCOPY (EGD) WITH PROPOFOL;  Surgeon: Otis Brace, MD;  Location: [facility];  Service: Gastroenterology;  Laterality: N/A;  . POLYPECTOMY  02/20/2020   Procedure: POLYPECTOMY;  Surgeon: Otis Brace, MD;  Location: [facility];  Service: Gastroenterology;;       Family History  Problem Relation Age of Onset  . Diabetes Mother   . Hypertension Mother   . Pancreatic cancer Mother   . Alcohol abuse Father   . Diabetes Sister   . Cancer Brother   . Congestive Heart Failure Sister     Social History   Tobacco Use  . Smoking status: Never Smoker  . Smokeless tobacco: Never Used  Vaping Use  . Vaping Use: Never used  Substance Use Topics  . Alcohol use: Not Currently    Alcohol/week: 6.0 standard drinks    Types: 6 Cans of beer per week    Comment: formerly alcoholic, hospitalized for withdrawal June 2021, now sober  . Drug use: No    Home Medications Prior to Admission medications   Medication Sig Start Date End Date Taking? Authorizing Provider  CONSTULOSE 10 GM/15ML solution Take 15 mLs by mouth daily. 01/19/20   [provider]  FLUoxetine (PROZAC) 10 MG capsule Take 1 capsule (10 mg total) by mouth daily. 12/06/19   Dewayne Hatch, MD  folic  acid (FOLVITE) 1 MG tablet Take 1 tablet (1 mg total) by mouth daily. 12/06/19 06/03/20  Masoudi, Dorthula Rue, MD  Multiple Vitamin (MULTIVITAMIN WITH MINERALS) TABS tablet Take 1 tablet by mouth daily. 12/06/19 06/03/20  Masoudi, Dorthula Rue, MD  pantoprazole (PROTONIX) 40 MG tablet Take 1 tablet (40 mg total) by mouth daily. 12/06/19 06/03/20  Masoudi, Dorthula Rue, MD  rifaximin (XIFAXAN) 550 MG TABS tablet Take 550  mg by mouth 2 (two) times daily.    [provider]  thiamine (VITAMIN B-1) 100 MG tablet TAKE 1 TABLET (100 MG TOTAL) BY MOUTH DAILY. 11/07/19   [provider]    Allergies    Patient has no known allergies.  Review of Systems   Review of Systems  Constitutional: Negative for chills, fatigue and fever.  HENT: Negative for congestion.   Respiratory: Negative for cough, chest tightness and shortness of breath.   Cardiovascular: Positive for chest pain. Negative for claudication.  Gastrointestinal: Negative for abdominal pain, constipation, diarrhea, nausea and vomiting.  Genitourinary: Negative for dysuria, flank pain and frequency.  Musculoskeletal: Negative for back pain, neck pain and neck stiffness.  Neurological: Negative for weakness, light-headedness, numbness and headaches.  Psychiatric/Behavioral: Negative for agitation.  All other systems reviewed and are negative.   Physical Exam Updated Vital Signs BP (!) 166/100 (BP Location: Right Arm)   Pulse 88   Temp 98.7 F (37.1 C) (Oral)   Resp 17   Ht 5\' 9"  (1.753 m)   Wt 73.4 kg   SpO2 99%   BMI 23.90 kg/m   Physical Exam Vitals and nursing note reviewed.  Constitutional:      General: He is not in acute distress.    Appearance: He is well-developed and well-nourished. He is not ill-appearing, toxic-appearing or diaphoretic.  HENT:     Head: Normocephalic and atraumatic.     Nose: No congestion or rhinorrhea.     Mouth/Throat:     Mouth: Mucous membranes are moist.     Pharynx: No oropharyngeal exudate or posterior oropharyngeal erythema.  Eyes:     Extraocular Movements: Extraocular movements intact.     Conjunctiva/sclera: Conjunctivae normal.     Pupils: Pupils are equal, round, and reactive to light.  Cardiovascular:     Rate and Rhythm: Normal rate and regular rhythm.     Heart sounds: No murmur heard.   Pulmonary:     Effort: Pulmonary effort is normal. No respiratory distress.      Breath sounds: Normal breath sounds. No wheezing, rhonchi or rales.  Chest:     Chest wall: No tenderness.  Abdominal:     General: Abdomen is flat.     Palpations: Abdomen is soft.     Tenderness: There is no abdominal tenderness. There is no right CVA tenderness, left CVA tenderness, guarding or rebound.  Musculoskeletal:        General: No tenderness or edema.     Cervical back: Neck supple.  Skin:    General: Skin is warm and dry.     Capillary Refill: Capillary refill takes less than 2 seconds.     Findings: No erythema.  Neurological:     General: No focal deficit present.     Mental Status: He is alert.     Sensory: No sensory deficit.     Motor: No weakness.  Psychiatric:        Mood and Affect: Mood and affect and mood normal.     ED Results / Procedures / Treatments  Labs (all labs ordered are listed, but only abnormal results are displayed) Labs Reviewed  URINE CULTURE  RESP PANEL BY RT-PCR (FLU A&B, COVID) ARPGX2  CBC WITH DIFFERENTIAL/PLATELET  COMPREHENSIVE METABOLIC PANEL  LIPASE, BLOOD  LACTIC ACID, PLASMA  LACTIC ACID, PLASMA  URINALYSIS, ROUTINE W REFLEX MICROSCOPIC  TROPONIN I (HIGH SENSITIVITY)  TROPONIN I (HIGH SENSITIVITY)    EKG EKG Interpretation  Date/Time:  Monday May 13 2020 13:19:49 EST Ventricular Rate:  74 PR Interval:  168 QRS Duration: 82 QT Interval:  380 QTC Calculation: 421 R Axis:   65 Text Interpretation: Normal sinus rhythm Normal ECG When compared to prior, no signifincant changes seen. No sTEMI Confirmed by Antony Blackbird [id number]) on 05/13/2020 3:39:16 PM   Radiology DG Chest 2 View  Result Date: 05/13/2020 CLINICAL DATA:  Chest pain.  Hypertension. EXAM: CHEST - 2 VIEW COMPARISON:  10/27/2019 FINDINGS: Lateral view degraded by patient arm position. Mild right hemidiaphragm elevation. Midline trachea. Normal heart size. No pleural effusion or pneumothorax. Clear lungs. IMPRESSION: No acute cardiopulmonary disease.  Electronically Signed   By: Abigail Miyamoto M.D.   On: 05/13/2020 13:51    Procedures Procedures (including critical care time)  Medications Ordered in ED Medications - No data to display  ED Course  I have reviewed the triage vital signs and the nursing notes.  Pertinent labs & imaging results that were available during my care of the patient were reviewed by me and considered in my medical decision making (see chart for details).    MDM Rules/Calculators/A&P                          Chase Parsons is a 65 y.o. male with a past medical history significant for alcohol abuse, alcoholic cirrhosis, GERD, and depression who presents with back pain and chest pain.  Patient reports that this morning around 11:30 AM, patient was at rest and started having severe pain in his upper back going straight through to his chest.  He reports it was a pressure type pain.  He describes the back pain as a 10 out of 10 in the chest was more moderate.  He has never had this pain before.  He reports no sweatiness, lightheadedness, nausea, vomiting, urinary symptoms or other GI symptoms.  He denies any new shortness breath or cough.  He called EMS and was found to have blood pressure of over [id number] systolic.  It was 210/130 initially.  Patient was given 1 sublingual nitroglycerin and aspirin and the chest pain improved.  He is still having some back pain.  He denies recent trauma.  Denies any other complaints.  On exam, lungs are clear and chest is nontender.  No murmur.  Good pulses in upper extremities.  Symmetric pulses in lower extremities.  No focal neurologic deficits.  I cannot reproduce the discomfort on his chest.  Had some mild paraspinal tenderness in the upper back.  EKG showed no STEMI.  Clinically I suspect this is more musculoskeletal however, given his report that the chest pain went straight through from his chest to his back and his blood pressure was over [id number] systolic, we do feel he needs to have a  dissection ruled out.  He will get a dissection study and labs.  Care transferred to Dr. Ralene Bathe while waiting for work-up to return.   Final Clinical Impression(s) / ED Diagnoses Final diagnoses:  Chest pain, unspecified type  Other acute back pain  Clinical Impression: 1. Chest pain, unspecified type   2. Other acute back pain     Disposition: Care transferred to Dr. Ralene Bathe while waiting for work-up to return.  This note was prepared with assistance of Systems analyst. Occasional wrong-word or sound-a-like substitutions may have occurred due to the inherent limitations of voice recognition software.       Gladine Plude, Gwenyth Allegra, MD 05/13/20 [phone]

## 2020-05-13 NOTE — ED Notes (Signed)
Reviewed discharge instructions with patient. Follow-up care reviewed. Patient verbalized understanding. Patient A&Ox4, VSS, and ambulatory with steady gait upon discharge.  

## 2020-05-13 NOTE — ED Provider Notes (Signed)
Patient care assumed at 1500.  Pt here for evaluation of chest pain radiating to back that started this morning.  He is completely asymptomatic on ED evaluation.  Labs, CTA pending.    CTA is negative for dissection and troponins negative times two. Patient has been completely pain-free during his entire ED stay. Discussed with patient finding of coronary artery disease on his CT scan. Discussed option for inpatient observation for chest pain workup versus discharge home and be first discharged home. Feel patient is safe for outpatient follow-up for his brief episode of chest pain.   Quintella Reichert, MD 05/13/20 2220

## 2020-05-13 NOTE — ED Notes (Signed)
Lactic Acid 2.0, notified Ralene Bathe MD

## 2020-05-13 NOTE — Discharge Instructions (Signed)
You had a CT scan of your chest, abdomen and pelvis today.  The CT scan showed atherosclerosis, or hardening of the arteries in your chest and abdomen.  Please follow up with your family doctor for further evaluation and medication adjustments.  Get rechecked immediately if your chest pain returns.

## 2020-05-13 NOTE — ED Notes (Signed)
Pt transported to Xray at this time.

## 2020-05-16 ENCOUNTER — Ambulatory Visit: Payer: Medicaid Other | Admitting: Student

## 2020-05-16 ENCOUNTER — Encounter: Payer: Self-pay | Admitting: Student

## 2020-05-16 DIAGNOSIS — I10 Essential (primary) hypertension: Secondary | ICD-10-CM | POA: Diagnosis not present

## 2020-05-16 DIAGNOSIS — K7030 Alcoholic cirrhosis of liver without ascites: Secondary | ICD-10-CM | POA: Diagnosis not present

## 2020-05-16 DIAGNOSIS — R778 Other specified abnormalities of plasma proteins: Secondary | ICD-10-CM

## 2020-05-16 MED ORDER — LOSARTAN POTASSIUM 50 MG PO TABS: 50.0000 mg | ORAL_TABLET | Freq: Every day | ORAL | 11 refills | Status: DC

## 2020-05-16 NOTE — Patient Instructions (Signed)
It was a pleasure seeing you in clinic. Today we discussed:   Elevated blood pressure: Please start taking losartan 50 mg daily. Please keep a log of your BP and bring them to you next visit.   Please follow up in 2 -4 weeks for BP recheck and lab work  If you have any questions or concerns, please call our clinic at [phone] between 9am-5pm and after hours call [phone] and ask for the internal medicine resident on call. If you feel you are having a medical emergency please call 911.   Thank you, we look forward to helping you remain healthy!  To schedule an appointment for a COVID vaccine or be added to the vaccine wait list: Go to [email or web address]   OR Go to [email or web address]                  OR Call [phone]                                     OR Call [phone] and select Option 2

## 2020-05-17 NOTE — Progress Notes (Signed)
   CC: Follow up of symptomatic hypertensive urgency   HPI:  [name] is a 65 y.o. male with history below presents for follow up of hypertensive urgency with associated chest pain on 05/13/2020. Please refer to problem based charting for further details and assessment and plan of current problem and chronic medical conditions.   Past Medical History:  Diagnosis Date  . Alcohol abuse   . Alcohol use disorder, moderate, dependence ([place]) 10/24/2019  . Encephalopathy   . Hyponatremia 10/24/2019  . Pneumonia   . Severe single current episode of major depressive disorder, without psychotic features ([place]) 01/26/2015  . Suicidal thoughts    Review of Systems:  Negative as per HPI  Physical Exam:  Vitals:   05/16/20 1506 05/16/20 1550  BP: (!) 160/107 (!) 153/92  Pulse: 100 98  SpO2: 99%   Weight:  161 lb 3.2 oz (73.1 kg)  Height:  5\' 9"  (1.753 m)   Physical Exam Constitutional:      Appearance: Normal appearance.  HENT:     Head: Normocephalic and atraumatic.     Right Ear: External ear normal.     Left Ear: External ear normal.     Nose: Nose normal.     Mouth/Throat:     Mouth: Mucous membranes are moist.     Pharynx: Oropharynx is clear.  Eyes:     Conjunctiva/sclera: Conjunctivae normal.     Pupils: Pupils are equal, round, and reactive to light.  Cardiovascular:     Rate and Rhythm: Normal rate and regular rhythm.     Pulses: Normal pulses.     Heart sounds: Normal heart sounds.  Pulmonary:     Effort: Pulmonary effort is normal.     Breath sounds: Normal breath sounds.  Abdominal:     General: Abdomen is flat. Bowel sounds are normal.     Palpations: Abdomen is soft.  Musculoskeletal:        General: Normal range of motion.     Right lower leg: No edema.     Left lower leg: No edema.  Skin:    General: Skin is warm and dry.     Capillary Refill: Capillary refill takes less than 2 seconds.  Neurological:     General: No focal deficit present.      Mental Status: He is alert. Mental status is at baseline.  Psychiatric:        Mood and Affect: Mood normal.        Behavior: Behavior normal.      Assessment & Plan:   See Encounters Tab for problem based charting.  Patient discussed with Dr. Philipp Ovens

## 2020-05-17 NOTE — Assessment & Plan Note (Addendum)
Patient presents for follow up of symptomatic hypertensive urgency. ON morning of 05/12/2020 patient started having chest pain that did not improve with tylenol. States pain was a pressure like sensation radiation up his shoulder and to the back. Sister called EMS and was taken to ED. Given ASA and nitro which resolved his pain. There he was found to have BP elevated to 200s. No EKG, negative troponin's, CTA with moderately severe coronary artery disease. Since that patient has been free of chest pain. BP today of 160/107. De nes chest pain palpitation, weakness, dizziness, headache, n/v, LOC. Had not been check BPs at home. Kidney function wnl on 1/17 in the ED. Will start on losartan 50 mg daily for BP. Patient may also benefit from statin therapy given finding of severe CAD on CTA, if liver disease remain well compensated and he is able to maintain abstinence from alcohol.   Plan Start losartan 50 mg daily Patient to being BP log to next visit Follow up in 2 weeks for blood pressure check and BMP

## 2020-05-21 DIAGNOSIS — R778 Other specified abnormalities of plasma proteins: Secondary | ICD-10-CM | POA: Insufficient documentation

## 2020-05-21 DIAGNOSIS — R7989 Other specified abnormal findings of blood chemistry: Secondary | ICD-10-CM | POA: Insufficient documentation

## 2020-05-21 NOTE — Addendum Note (Signed)
Addended by: Iona Beard on: 05/21/2020 07:28 AM   Modules accepted: Level of Service

## 2020-05-21 NOTE — Assessment & Plan Note (Addendum)
Patient with history of cirrhosis redemonstrated on recent CTA. Follow with GI. CMP and CBC during ED visit benign. MELD score of 9 with <2 % 90 day mortality. Patient has been remaining abstinent of alcohol with help of sister. On exam no ascites, jaundice, or asterixis. Does not appear to have decompensated liver disease at this time and if liver disease remains well controlled may benefit statin therapy for secondary prevention given severe CAD.  Plan - Continue rifaximin - Continue to monitor

## 2020-05-21 NOTE — Assessment & Plan Note (Addendum)
On labs in the ED on 05/13/2020 noted to have elevated total protein and normal albumin and mild anemia. Total protein also elevated back in 12/2019 This may be related to his liver cirrhosis, but may benefit from further workup for multiple myeloma as this has not been done before. Patient is very anxious in medical settings and has much difficulty with blood draws. Will defer labs for SPEP, UPEP, serum free light chains until he follows up for BP recheck and renal function labs after starting on ARB to minimze blood draws  Plan Consider lipid panel, SPEP, UPEP, light chains at next visit

## 2020-05-23 NOTE — Progress Notes (Signed)
Internal Medicine Clinic Attending ? ?Case discussed with Dr. Liang  At the time of the visit.  We reviewed the resident?s history and exam and pertinent patient test results.  I agree with the assessment, diagnosis, and plan of care documented in the resident?s note. ? ?

## 2020-05-30 ENCOUNTER — Encounter: Payer: Self-pay | Admitting: *Deleted

## 2020-06-07 NOTE — Progress Notes (Incomplete)
   CC: ***  HPI:  [name] is a 65 y.o. male with history as below presenting for ***. Please refer to problem based charting for further details of assessment and plan of current problem and chronic medical conditions.  HTN    Seen in ED for severe symptomatic hypertension on 05/13/20, SBP in 200s. Negative troponin, EKG. CTA without dissection but did show severe CAD. BP of 160/107 on follow up in clinic. Started on losartan $RemoveBef'50mg'[id number]$  daily. Denies chest pain, palpitations, dizziness, headaches, LOC***.   -bmp for kidney function  Elevated total protein On labs in the ED on 05/13/2020 noted to have elevated total protein and normal albumin and mild anemia. Total protein also elevated back in 12/2019 This may be related to his liver cirrhosis, but may benefit from further workup for multiple myeloma as this has not been done before. Labs deferred at last visit due to aversion to blood draws -lipid panel, SPEP, UPEP, light chains  CAD -lipid panel  Past Medical History:  Diagnosis Date  . Alcohol abuse   . Alcohol use disorder, moderate, dependence ([place]) 10/24/2019  . Encephalopathy   . Hyponatremia 10/24/2019  . Pneumonia   . Severe single current episode of major depressive disorder, without psychotic features ([place]) 01/26/2015  . Suicidal thoughts    Review of Systems:   ROS ***  Physical Exam: There were no vitals filed for this visit. Constitutional: no acute distress Head: atraumatic ENT: external ears normal Cardiovascular: regular rate and rhythm, normal heart sounds Pulmonary: effort normal, normal breath sounds bilaterally Abdominal: flat, nontender, no rebound tenderness, bowel sounds normal Musculoskeletal: *** Skin: warm and dry Neurological: alert, no focal deficit Psychiatric: normal mood and affect  Assessment & Plan:   See Encounters Tab for problem based charting.  Patient {GC/GE:[id number]::"discussed with","seen with"} Dr.  {NAMES:[id number]::"Butcher","Guilloud","Hoffman","Mullen","Narendra","Raines","Vincent"}

## 2020-06-10 ENCOUNTER — Encounter: Payer: Medicaid Other | Admitting: Student

## 2020-06-17 ENCOUNTER — Encounter: Payer: Medicaid Other | Admitting: Student

## 2020-07-15 ENCOUNTER — Other Ambulatory Visit: Payer: Self-pay

## 2020-07-15 ENCOUNTER — Ambulatory Visit (INDEPENDENT_AMBULATORY_CARE_PROVIDER_SITE_OTHER): Payer: Medicaid Other | Admitting: Cardiovascular Disease

## 2020-07-15 ENCOUNTER — Encounter: Payer: Self-pay | Admitting: Cardiovascular Disease

## 2020-07-15 VITALS — BP 176/122 | HR 96 | Ht 72.0 in | Wt 164.0 lb

## 2020-07-15 DIAGNOSIS — F1010 Alcohol abuse, uncomplicated: Secondary | ICD-10-CM

## 2020-07-15 DIAGNOSIS — K7030 Alcoholic cirrhosis of liver without ascites: Secondary | ICD-10-CM

## 2020-07-15 DIAGNOSIS — I10 Essential (primary) hypertension: Secondary | ICD-10-CM | POA: Diagnosis not present

## 2020-07-15 MED ORDER — AMLODIPINE BESYLATE 5 MG PO TABS: 5.0000 mg | ORAL_TABLET | Freq: Every day | ORAL | 3 refills | Status: DC

## 2020-07-15 NOTE — Patient Instructions (Addendum)
Medication Instructions:  START AMLODIPINE 5 MG DAILY    Labwork: FASTING LP/CMET SOON    Testing/Procedures: NONE   Follow-Up: August 15, 2020 AT 9:30 AM WITH PHARM D    Special Instructions:   MONITOR YOUR BLOOD PRESSURE TWICE A DAY, LOG IN THE BOOK PROVIDED. BRING THE BOOK AND YOUR BLOOD PRESSURE MACHINE TO YOUR FOLLOW UP IN 1 MONTH   OUR SOCIAL WORKER ISABEL WILL BE IN TOUCH SOON   DASH Eating Plan DASH stands for "Dietary Approaches to Stop Hypertension." The DASH eating plan is a healthy eating plan that has been shown to reduce high blood pressure (hypertension). It may also reduce your risk for type 2 diabetes, heart disease, and stroke. The DASH eating plan may also help with weight loss. What are tips for following this plan?  General guidelines  Avoid eating more than 2,300 mg (milligrams) of salt (sodium) a day. If you have hypertension, you may need to reduce your sodium intake to 1,500 mg a day.  Limit alcohol intake to no more than 1 drink a day for nonpregnant women and 2 drinks a day for men. One drink equals 12 oz of beer, 5 oz of wine, or 1 oz of hard liquor.  Work with your health care provider to maintain a healthy body weight or to lose weight. Ask what an ideal weight is for you.  Get at least 30 minutes of exercise that causes your heart to beat faster (aerobic exercise) most days of the week. Activities may include walking, swimming, or biking.  Work with your health care provider or diet and nutrition specialist (dietitian) to adjust your eating plan to your individual calorie needs. Reading food labels   Check food labels for the amount of sodium per serving. Choose foods with less than 5 percent of the Daily Value of sodium. Generally, foods with less than 300 mg of sodium per serving fit into this eating plan.  To find whole grains, look for the word "whole" as the first word in the ingredient list. Shopping  Buy products labeled as "low-sodium"  or "no salt added."  Buy fresh foods. Avoid canned foods and premade or frozen meals. Cooking  Avoid adding salt when cooking. Use salt-free seasonings or herbs instead of table salt or sea salt. Check with your health care provider or pharmacist before using salt substitutes.  Do not fry foods. Cook foods using healthy methods such as baking, boiling, grilling, and broiling instead.  Cook with heart-healthy oils, such as olive, canola, soybean, or sunflower oil. Meal planning  Eat a balanced diet that includes: ? 5 or more servings of fruits and vegetables each day. At each meal, try to fill half of your plate with fruits and vegetables. ? Up to 6-8 servings of whole grains each day. ? Less than 6 oz of lean meat, poultry, or fish each day. A 3-oz serving of meat is about the same size as a deck of cards. One egg equals 1 oz. ? 2 servings of low-fat dairy each day. ? A serving of nuts, seeds, or beans 5 times each week. ? Heart-healthy fats. Healthy fats called Omega-3 fatty acids are found in foods such as flaxseeds and coldwater fish, like sardines, salmon, and mackerel.  Limit how much you eat of the following: ? Canned or prepackaged foods. ? Food that is high in trans fat, such as fried foods. ? Food that is high in saturated fat, such as fatty meat. ? Sweets, desserts, sugary  drinks, and other foods with added sugar. ? Full-fat dairy products.  Do not salt foods before eating.  Try to eat at least 2 vegetarian meals each week.  Eat more home-cooked food and less restaurant, buffet, and fast food.  When eating at a restaurant, ask that your food be prepared with less salt or no salt, if possible. What foods are recommended? The items listed may not be a complete list. Talk with your dietitian about what dietary choices are best for you. Grains Whole-grain or whole-wheat bread. Whole-grain or whole-wheat pasta. Brown rice. [place]. Bulgur. Whole-grain and low-sodium  cereals. Pita bread. Low-fat, low-sodium crackers. Whole-wheat flour tortillas. Vegetables Fresh or frozen vegetables (raw, steamed, roasted, or grilled). Low-sodium or reduced-sodium tomato and vegetable juice. Low-sodium or reduced-sodium tomato sauce and tomato paste. Low-sodium or reduced-sodium canned vegetables. Fruits All fresh, dried, or frozen fruit. Canned fruit in natural juice (without added sugar). Meat and other protein foods Skinless chicken or [place]. Ground chicken or [place]. Pork with fat trimmed off. Fish and seafood. Egg whites. Dried beans, peas, or lentils. Unsalted nuts, nut butters, and seeds. Unsalted canned beans. Lean cuts of beef with fat trimmed off. Low-sodium, lean deli meat. Dairy Low-fat (1%) or fat-free (skim) milk. Fat-free, low-fat, or reduced-fat cheeses. Nonfat, low-sodium ricotta or cottage cheese. Low-fat or nonfat yogurt. Low-fat, low-sodium cheese. Fats and oils Soft margarine without trans fats. Vegetable oil. Low-fat, reduced-fat, or light mayonnaise and salad dressings (reduced-sodium). Canola, safflower, olive, soybean, and sunflower oils. Avocado. Seasoning and other foods Herbs. Spices. Seasoning mixes without salt. Unsalted popcorn and pretzels. Fat-free sweets. What foods are not recommended? The items listed may not be a complete list. Talk with your dietitian about what dietary choices are best for you. Grains Baked goods made with fat, such as croissants, muffins, or some breads. Dry pasta or rice meal packs. Vegetables Creamed or fried vegetables. Vegetables in a cheese sauce. Regular canned vegetables (not low-sodium or reduced-sodium). Regular canned tomato sauce and paste (not low-sodium or reduced-sodium). Regular tomato and vegetable juice (not low-sodium or reduced-sodium). Angie Fava. Olives. Fruits Canned fruit in a light or heavy syrup. Fried fruit. Fruit in cream or butter sauce. Meat and other protein foods Fatty cuts of meat. Ribs.  Fried meat. Berniece Salines. Sausage. Bologna and other processed lunch meats. Salami. Fatback. Hotdogs. Bratwurst. Salted nuts and seeds. Canned beans with added salt. Canned or smoked fish. Whole eggs or egg yolks. Chicken or [place] with skin. Dairy Whole or 2% milk, cream, and half-and-half. Whole or full-fat cream cheese. Whole-fat or sweetened yogurt. Full-fat cheese. Nondairy creamers. Whipped toppings. Processed cheese and cheese spreads. Fats and oils Butter. Stick margarine. Lard. Shortening. Ghee. Bacon fat. Tropical oils, such as coconut, palm kernel, or palm oil. Seasoning and other foods Salted popcorn and pretzels. Onion salt, garlic salt, seasoned salt, table salt, and sea salt. Worcestershire sauce. Tartar sauce. Barbecue sauce. Teriyaki sauce. Soy sauce, including reduced-sodium. Steak sauce. Canned and packaged gravies. Fish sauce. Oyster sauce. Cocktail sauce. Horseradish that you find on the shelf. Ketchup. Mustard. Meat flavorings and tenderizers. Bouillon cubes. Hot sauce and Tabasco sauce. Premade or packaged marinades. Premade or packaged taco seasonings. Relishes. Regular salad dressings. Where to find more information:  National Heart, Lung, and [place]: https://wilson-eaton.com/  American Heart Association: www.heart.org Summary  The DASH eating plan is a healthy eating plan that has been shown to reduce high blood pressure (hypertension). It may also reduce your risk for type 2 diabetes, heart disease, and  stroke.  With the DASH eating plan, you should limit salt (sodium) intake to 2,300 mg a day. If you have hypertension, you may need to reduce your sodium intake to 1,500 mg a day.  When on the DASH eating plan, aim to eat more fresh fruits and vegetables, whole grains, lean proteins, low-fat dairy, and heart-healthy fats.  Work with your health care provider or diet and nutrition specialist (dietitian) to adjust your eating plan to your individual calorie needs. This  information is not intended to replace advice given to you by your health care provider. Make sure you discuss any questions you have with your health care provider. Document Released: 04/02/2011 Document Revised: 03/26/2017 Document Reviewed: 04/06/2016 Elsevier Patient Education  2020 [employer].
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
## 2020-07-16 ENCOUNTER — Telehealth: Payer: Self-pay | Admitting: Licensed Clinical Social Worker

## 2020-07-16 LAB — COMPREHENSIVE METABOLIC PANEL
ALT: 26 IU/L (ref 0–44)
AST: 28 IU/L (ref 0–40)
Albumin/Globulin Ratio: 1.4 (ref 1.2–2.2)
Albumin: 4.9 g/dL — ABNORMAL HIGH (ref 3.8–4.8)
Alkaline Phosphatase: 136 IU/L — ABNORMAL HIGH (ref 44–121)
BUN/Creatinine Ratio: 11 (ref 10–24)
BUN: 9 mg/dL (ref 8–27)
Bilirubin Total: 0.5 mg/dL (ref 0.0–1.2)
CO2: 26 mmol/L (ref 20–29)
Calcium: 9.5 mg/dL (ref 8.6–10.2)
Chloride: 100 mmol/L (ref 96–106)
Creatinine, Ser: 0.79 mg/dL (ref 0.76–1.27)
Globulin, Total: 3.6 g/dL (ref 1.5–4.5)
Glucose: 91 mg/dL (ref 65–99)
Potassium: 4.8 mmol/L (ref 3.5–5.2)
Sodium: 143 mmol/L (ref 134–144)
Total Protein: 8.5 g/dL (ref 6.0–8.5)
eGFR: 99 mL/min/{1.73_m2} (ref >59)

## 2020-07-16 LAB — LIPID PANEL
Chol/HDL Ratio: 3.2 ratio (ref 0.0–5.0)
Cholesterol, Total: 184 mg/dL (ref 100–199)
HDL: 57 mg/dL (ref >39)
LDL Chol Calc (NIH): 91 mg/dL (ref 0–99)
Triglycerides: 213 mg/dL — ABNORMAL HIGH (ref 0–149)
VLDL Cholesterol Cal: 36 mg/dL (ref 5–40)

## 2020-07-16 NOTE — Telephone Encounter (Signed)
LCSW spoke with pt this afternoon briefly at [phone]. Introduced self, role, reason for call. Pt shares he is heading to his part time job and inquires if LCSW can call him back Thursday at [place]. I shared that I would place him on my calendar and call back at that time.   Chase Parsons, MSW, [place]  [phone]

## 2020-07-18 ENCOUNTER — Telehealth: Payer: Self-pay | Admitting: Licensed Clinical Social Worker

## 2020-07-18 NOTE — Progress Notes (Signed)
Heart and Vascular Care Navigation  07/18/2020  Yuto Cajuste 1955-05-22 [id number]  Reason for Referral:  ETOH cessation resources Engaged with patient by telephone for initial visit for Heart and Vascular Care Coordination.                                                                                                   Assessment:        LCSW called at 10am as discussed earlier this week- pt still sleeping. I was able to reach him again at 11am for full assessment. Pt does not have a phone- utilizes his sister Evangelene's phone. She is present on speakerphone which pt is okay with, she also provides most of the answers also to pt in the background of the call. Confirmed home address, PCP and current emergency contacts. Pt lives with his sister Gwyndolyn Saxon and brother in law Herbie [place]; they collectively request for pt brother Randall Hiss to removed from contact list as they "don't know where he is." Pt and pt sister deny any current housing cost issues, pt recieves SNAP, and they also deny any costs around obtaining or affording medications at this time. Pt sister provides rides to pt to get to appointments; I shared information about [facility] for pt, they will let me know if this is something that he needs help with in the future.  Pt receives disability income, there was some question about [employer] coverage which patient family member had signed patient up for and it is taking some of his income at this time. He would like to cancel this since he is getting all of his needs met with his Medicaid coverage. I provided pt sister with information to contact [facility] Medicare about current coverage and to cancel it if they desire to do so.   I then let pt know about reason for referral related to pt concerns around taking medications since he shared at the office that he is a regular drinker. Pt and pt sister deny any desire for support resources at this moment. Pt sister states pt  has not had anything to drink this week and she is there to support him. LCSW offered to mail resources to pt home if he so desires in case they would like to look into any more formal resources. Pt agreeable to this. No additional questions/concerns shared by pt or pt sister at this time.                                   HRT/VAS Care Coordination    Patients Home Cardiology Office [place] Team Social Worker   Social Worker Name: Westley Hummer, LCSW, [place] arrangements for the past 2 months Single Family Home   Lives with: Siblings; Relatives   Patient Current Insurance Coverage Medicaid   Patient Has Concern With Paying Medical Bills No   Does Patient Have Prescription Coverage? Yes   Home Assistive Devices/Equipment None   DME Agency [employer]  Social History:                                                                             SDOH Screenings   Alcohol Screen: Medium Risk  . Last Alcohol Screening Score (AUDIT): 38  Depression (PHQ2-9): Low Risk   . PHQ-2 Score: 3  Financial Resource Strain: Low Risk   . Difficulty of Paying Living Expenses: Not hard at all  Food Insecurity: No Food Insecurity  . Worried About Charity fundraiser in the Last Year: Never true  . Ran Out of Food in the Last Year: Never true  Housing: Low Risk   . Last Housing Risk Score: 0  Physical Activity: Inactive  . Days of Exercise per Week: 0 days  . Minutes of Exercise per Session: 0 min  Social Connections: Not on file  Stress: Stress Concern Present  . Feeling of Stress : To some extent  Tobacco Use: Low Risk   . Smoking Tobacco Use: Never Smoker  . Smokeless Tobacco Use: Never Used  Transportation Needs: No Transportation Needs  . Lack of Transportation (Medical): No  . Lack of Transportation (Non-Medical): No    SDOH Interventions: Financial Resources:  Sales promotion account executive Interventions: Intervention Not Indicated- pt sister  denies current Estate agent Insecurity:   Food Insecurity: Intervention Not Indicated- pt receives [employer]  Housing Insecurity:  Housing Interventions: Intervention Not Indicated  Transportation:   Transportation Interventions: Financial planner- pt sister provides rides      Other Care Navigation Interventions:     Provided Pharmacy assistance resources  Pt able to obtain medications at [facility] and has   Patient expressed Mental Health concerns No.- pt denies current challenges- hasnt had anything to drink "this week." Pt sister states that she will support pt but open to additional resources.  Patient Referred to: [facility] and Outpatient Substance Abuse    Follow-up plan:   LCSW has mailed information regarding outpatient supports to patient, I have also mailed my card and Transportation Services card. I remain available as needed moving forward.

## 2020-07-24 ENCOUNTER — Other Ambulatory Visit: Payer: Self-pay | Admitting: Internal Medicine

## 2020-07-24 DIAGNOSIS — F322 Major depressive disorder, single episode, severe without psychotic features: Secondary | ICD-10-CM

## 2020-07-24 DIAGNOSIS — Z87898 Personal history of other specified conditions: Secondary | ICD-10-CM

## 2020-07-25 ENCOUNTER — Encounter (HOSPITAL_COMMUNITY): Payer: Self-pay | Admitting: Emergency Medicine

## 2020-07-25 ENCOUNTER — Emergency Department (HOSPITAL_COMMUNITY)
Admission: EM | Admit: 2020-07-25 | Discharge: 2020-07-25 | Disposition: A | Discharge status: Home / Self Care | Payer: Medicaid Other | Attending: Emergency Medicine | Admitting: Emergency Medicine

## 2020-07-25 ENCOUNTER — Other Ambulatory Visit: Payer: Self-pay

## 2020-07-25 DIAGNOSIS — R5383 Other fatigue: Secondary | ICD-10-CM | POA: Insufficient documentation

## 2020-07-25 DIAGNOSIS — R1030 Lower abdominal pain, unspecified: Secondary | ICD-10-CM | POA: Insufficient documentation

## 2020-07-25 DIAGNOSIS — R109 Unspecified abdominal pain: Secondary | ICD-10-CM

## 2020-07-25 DIAGNOSIS — K219 Gastro-esophageal reflux disease without esophagitis: Secondary | ICD-10-CM | POA: Insufficient documentation

## 2020-07-25 DIAGNOSIS — R197 Diarrhea, unspecified: Secondary | ICD-10-CM | POA: Diagnosis not present

## 2020-07-25 DIAGNOSIS — K0889 Other specified disorders of teeth and supporting structures: Secondary | ICD-10-CM | POA: Diagnosis not present

## 2020-07-25 DIAGNOSIS — R112 Nausea with vomiting, unspecified: Secondary | ICD-10-CM | POA: Diagnosis not present

## 2020-07-25 LAB — TYPE AND SCREEN
ABO/RH(D): B POS
Antibody Screen: NEGATIVE

## 2020-07-25 LAB — CBC
HCT: 43.2 % (ref 39.0–52.0)
Hemoglobin: 14.6 g/dL (ref 13.0–17.0)
MCH: 33 pg (ref 26.0–34.0)
MCHC: 33.8 g/dL (ref 30.0–36.0)
MCV: 97.5 fL (ref 80.0–100.0)
Platelets: 253 10*3/uL (ref 150–400)
RBC: 4.43 MIL/uL (ref 4.22–5.81)
RDW: 11.7 % (ref 11.5–15.5)
WBC: 7.9 10*3/uL (ref 4.0–10.5)
nRBC: 0 % (ref 0.0–0.2)

## 2020-07-25 LAB — COMPREHENSIVE METABOLIC PANEL
ALT: 27 U/L (ref 0–44)
AST: 32 U/L (ref 15–41)
Albumin: 4.6 g/dL (ref 3.5–5.0)
Alkaline Phosphatase: 89 U/L (ref 38–126)
Anion gap: 13 (ref 5–15)
BUN: 9 mg/dL (ref 8–23)
CO2: 25 mmol/L (ref 22–32)
Calcium: 9.3 mg/dL (ref 8.9–10.3)
Chloride: 97 mmol/L — ABNORMAL LOW (ref 98–111)
Creatinine, Ser: 1.07 mg/dL (ref 0.61–1.24)
GFR, Estimated: >60 mL/min (ref >60)
Glucose, Bld: 148 mg/dL — ABNORMAL HIGH (ref 70–99)
Potassium: 3.2 mmol/L — ABNORMAL LOW (ref 3.5–5.1)
Sodium: 135 mmol/L (ref 135–145)
Total Bilirubin: 2.9 mg/dL — ABNORMAL HIGH (ref 0.3–1.2)
Total Protein: 9 g/dL — ABNORMAL HIGH (ref 6.5–8.1)

## 2020-07-25 LAB — URINALYSIS, ROUTINE W REFLEX MICROSCOPIC
Bilirubin Urine: NEGATIVE
Glucose, UA: NEGATIVE mg/dL
Hgb urine dipstick: NEGATIVE
Ketones, ur: 5 mg/dL — AB
Leukocytes,Ua: NEGATIVE
Nitrite: NEGATIVE
Protein, ur: NEGATIVE mg/dL
Specific Gravity, Urine: 1.013 (ref 1.005–1.030)
pH: 6 (ref 5.0–8.0)

## 2020-07-25 LAB — LIPASE, BLOOD: Lipase: 25 U/L (ref 11–51)

## 2020-07-25 LAB — POC OCCULT BLOOD, ED: Fecal Occult Bld: NEGATIVE

## 2020-07-25 LAB — PROTIME-INR
INR: 1.1 (ref 0.8–1.2)
Prothrombin Time: 13.4 seconds (ref 11.4–15.2)

## 2020-07-25 LAB — ETHANOL: Alcohol, Ethyl (B): <10 mg/dL (ref <10)

## 2020-07-25 MED ORDER — ONDANSETRON HCL 4 MG/2ML IJ SOLN
4.0000 mg | Freq: Once | INTRAMUSCULAR | Status: AC
  Administered 2020-07-25: 4 mg via INTRAVENOUS
  Filled 2020-07-25: qty 2

## 2020-07-25 MED ORDER — PANTOPRAZOLE SODIUM 40 MG IV SOLR
40.0000 mg | Freq: Once | INTRAVENOUS | Status: AC
  Administered 2020-07-25: 40 mg via INTRAVENOUS
  Filled 2020-07-25: qty 40

## 2020-07-25 MED ORDER — ONDANSETRON 4 MG PO TBDP: 4.0000 mg | ORAL_TABLET | Freq: Three times a day (TID) | ORAL | 0 refills | Status: DC | PRN

## 2020-07-25 MED ORDER — SODIUM CHLORIDE 0.9 % IV BOLUS
1000.0000 mL | Freq: Once | INTRAVENOUS | Status: AC
  Administered 2020-07-25: 1000 mL via INTRAVENOUS

## 2020-07-25 MED ORDER — PANTOPRAZOLE SODIUM 40 MG PO TBEC
40.0000 mg | DELAYED_RELEASE_TABLET | Freq: Every day | ORAL | 1 refills | Status: DC
Start: 2020-07-25 — End: 2021-02-25

## 2020-07-25 NOTE — Discharge Instructions (Signed)
Watch for bleeding.  Try and keep yourself hydrated.  We will give the nausea medicines to help.  Follow-up with your primary care doctor soon.

## 2020-07-25 NOTE — ED Notes (Signed)
Pt. Ambulated to bathroom.

## 2020-07-25 NOTE — ED Notes (Signed)
Pt in room. Changed in gown and placed on monitor. Call light in reach

## 2020-07-25 NOTE — ED Triage Notes (Signed)
Pt reports began having vomiting with blood and diarrhea since yesterday. Pt with hx of alcohol abuse. Last drink a couple days ago.

## 2020-07-25 NOTE — ED Provider Notes (Signed)
[facility] EMERGENCY DEPARTMENT Provider Note   CSN: [id number] Arrival date & time: 07/25/20  0272     History Chief Complaint  Patient presents with  . Abdominal Pain    Chase Parsons is a 65 y.o. male.  HPI Patient presents with nausea vomiting diarrhea.  States he has had some black emesis and some black and bloody diarrhea.  Began last night.  States he has been fatigued with no appetite over the last couple days.  Is a heavy drinker and has not drank in the last couple days either.  Feels as if it could be some withdrawal.  Has some dull abdominal pain.  No fevers or chills.  Does not feel lightheaded or dizziness but does feel better overall he said.  He is not on blood thinners.    Past Medical History:  Diagnosis Date  . Alcohol abuse   . Alcohol use disorder, moderate, dependence ([place]) 10/24/2019  . Encephalopathy   . Hyponatremia 10/24/2019  . Pneumonia   . Severe single current episode of major depressive disorder, without psychotic features ([place]) 01/26/2015  . Suicidal thoughts     Patient Active Problem List   Diagnosis Date Noted  . Elevated total protein 05/21/2020  . Alcoholic cirrhosis ([place]) 53/66/4403  . Severe symptomatic hypertensive urgenrcy  02/29/2020  . Diarrhea 12/06/2019  . Insomnia 12/06/2019  . Healthcare maintenance 12/06/2019  . GERD (gastroesophageal reflux disease) 12/06/2019  . Anemia of chronic disease 12/06/2019  . Physical deconditioning 10/24/2019  . Alcohol use disorder, mild, in controlled environment 10/24/2019  . MDD (major depressive disorder), single episode, severe , no psychosis ([place]) 01/26/2015    Past Surgical History:  Procedure Laterality Date  . BIOPSY  02/20/2020   Procedure: BIOPSY;  Surgeon: Otis Brace, MD;  Location: [facility];  Service: Gastroenterology;;  EGD and COLON  . COLONOSCOPY WITH PROPOFOL N/A 02/20/2020   Procedure: COLONOSCOPY WITH PROPOFOL;  Surgeon: Otis Brace,  MD;  Location: [facility];  Service: Gastroenterology;  Laterality: N/A;  . ESOPHAGOGASTRODUODENOSCOPY (EGD) WITH PROPOFOL N/A 02/20/2020   Procedure: ESOPHAGOGASTRODUODENOSCOPY (EGD) WITH PROPOFOL;  Surgeon: Otis Brace, MD;  Location: [facility];  Service: Gastroenterology;  Laterality: N/A;  . POLYPECTOMY  02/20/2020   Procedure: POLYPECTOMY;  Surgeon: Otis Brace, MD;  Location: [facility];  Service: Gastroenterology;;       Family History  Problem Relation Age of Onset  . Diabetes Mother   . Hypertension Mother   . Pancreatic cancer Mother   . Alcohol abuse Father   . Diabetes Sister   . Hypertension Sister   . Cancer Brother   . Congestive Heart Failure Sister     Social History   Tobacco Use  . Smoking status: Never Smoker  . Smokeless tobacco: Never Used  Vaping Use  . Vaping Use: Never used  Substance Use Topics  . Alcohol use: Yes    Alcohol/week: 4.0 standard drinks    Types: 4 Cans of beer per week  . Drug use: No    Home Medications Prior to Admission medications   Medication Sig Start Date End Date Taking? Authorizing Provider  ondansetron (ZOFRAN-ODT) 4 MG disintegrating tablet Take 1 tablet (4 mg total) by mouth every 8 (eight) hours as needed for nausea or vomiting. 07/25/20  Yes Davonna Belling, MD  acetaminophen (TYLENOL) 500 MG tablet Take 1,000 mg by mouth every 6 (six) hours as needed for mild pain.    [provider]  amLODipine (NORVASC) 5 MG tablet  Take 1 tablet (5 mg total) by mouth daily. 07/15/20 10/13/20  Skeet Latch, MD  FLUoxetine (PROZAC) 10 MG capsule Take 1 capsule by mouth once daily 07/24/20   Mitzi Hansen, MD  folic acid (FOLVITE) 1 MG tablet Take 1 tablet by mouth once daily 07/24/20   Mitzi Hansen, MD  ibuprofen (ADVIL) 200 MG tablet Take 400 mg by mouth every 6 (six) hours as needed for mild pain.    [provider]  losartan (COZAAR) 50 MG tablet Take 1 tablet (50 mg total) by mouth  daily. 05/16/20 05/16/21  Iona Beard, MD  pantoprazole (PROTONIX) 40 MG tablet Take 1 tablet (40 mg total) by mouth daily. 07/25/20 01/21/21  Davonna Belling, MD  rifaximin (XIFAXAN) 550 MG TABS tablet Take 550 mg by mouth 2 (two) times daily.    [provider]  thiamine (VITAMIN B-1) 100 MG tablet Take 100 mg by mouth daily. 11/07/19   [provider]    Allergies    Patient has no known allergies.  Review of Systems   Review of Systems  Constitutional: Positive for appetite change and fatigue.  HENT: Negative for congestion.   Respiratory: Negative for shortness of breath.   Cardiovascular: Negative for chest pain.  Gastrointestinal: Positive for abdominal pain, blood in stool, diarrhea, nausea and vomiting.  Endocrine: Negative for polyuria.  Genitourinary: Negative for flank pain.  Musculoskeletal: Negative for back pain.  Skin: Negative for rash.  Neurological: Negative for tremors.  Psychiatric/Behavioral: Negative for confusion.    Physical Exam Updated Vital Signs BP (!) 152/102   Pulse (!) 116   Temp 98.4 F (36.9 C)   Resp 15   Ht 6' (1.829 m)   Wt 70.3 kg   SpO2 90%   BMI 21.02 kg/m   Physical Exam Vitals and nursing note reviewed.  HENT:     Head: Normocephalic.  Cardiovascular:     Rate and Rhythm: Normal rate and regular rhythm.  Pulmonary:     Breath sounds: No rhonchi.  Abdominal:     Comments: Mild lower abdominal tenderness no rebound or guarding.  No hernia palpated.  Genitourinary:    Comments: Guaiac negative although minimal stool on rectal exam. Skin:    General: Skin is warm.     Capillary Refill: Capillary refill takes less than 2 seconds.  Neurological:     Mental Status: He is alert and oriented to person, place, and time.  Psychiatric:        Mood and Affect: Mood normal.     ED Results / Procedures / Treatments   Labs (all labs ordered are listed, but only abnormal results are displayed) Labs Reviewed   COMPREHENSIVE METABOLIC PANEL - Abnormal; Notable for the following components:      Result Value   Potassium 3.2 (*)    Chloride 97 (*)    Glucose, Bld 148 (*)    Total Protein 9.0 (*)    Total Bilirubin 2.9 (*)    All other components within normal limits  URINALYSIS, ROUTINE W REFLEX MICROSCOPIC - Abnormal; Notable for the following components:   Ketones, ur 5 (*)    All other components within normal limits  LIPASE, BLOOD  CBC  PROTIME-INR  ETHANOL  POC OCCULT BLOOD, ED  TYPE AND SCREEN  ABO/RH    EKG EKG Interpretation  Date/Time:  Thursday July 25 2020 09:42:44 EDT Ventricular Rate:  95 PR Interval:  159 QRS Duration: 92 QT Interval:  366 QTC Calculation: 461 R  Axis:   81 Text Interpretation: Sinus rhythm Borderline right axis deviation Confirmed by Davonna Belling [phone]) on 07/25/2020 11:31:50 AM   Radiology No results found.  Procedures Procedures   Medications Ordered in ED Medications  pantoprazole (PROTONIX) injection 40 mg (40 mg Intravenous Given 07/25/20 1005)  ondansetron (ZOFRAN) injection 4 mg (4 mg Intravenous Given 07/25/20 1004)  sodium chloride 0.9 % bolus 1,000 mL (0 mLs Intravenous Stopped 07/25/20 1356)    ED Course  I have reviewed the triage vital signs and the nursing notes.  Pertinent labs & imaging results that were available during my care of the patient were reviewed by me and considered in my medical decision making (see chart for details).    MDM Rules/Calculators/A&P                          Patient with nausea vomiting dental pain.  Reportedly blood in the stool.  Negative on my test.  Hemoglobin reassuring.  Labs reassuring.  Feels better.  Only minimal amount of tenderness on exam.  Has tolerated orals.  Potentially could be gastroenteritis or other more benign cause of the vomiting.  Review and has a history of cirrhosis but has not had varices in the past.  Do not think we need further work-up at this point.  Will refill  the patient's Protonix and give Zofran also.  Discharge home with outpatient follow-up with PCP. Initial differential diagnosis included bowel obstruction, gastroenteritis, GI bleed. Final Clinical Impression(s) / ED Diagnoses Final diagnoses:  Abdominal pain, unspecified abdominal location  Nausea vomiting and diarrhea    Rx / DC Orders ED Discharge Orders         Ordered    pantoprazole (PROTONIX) 40 MG tablet  Daily        07/25/20 1436    ondansetron (ZOFRAN-ODT) 4 MG disintegrating tablet  Every 8 hours PRN        07/25/20 1436           Davonna Belling, MD 07/25/20 1506

## 2020-07-25 NOTE — ED Notes (Signed)
Pt tolerated well PO challenge.

## 2020-08-15 ENCOUNTER — Ambulatory Visit (INDEPENDENT_AMBULATORY_CARE_PROVIDER_SITE_OTHER): Payer: Medicare Other | Admitting: Pharmacist Clinician (PhC)/ Clinical Pharmacy Specialist

## 2020-08-15 ENCOUNTER — Other Ambulatory Visit: Payer: Self-pay

## 2020-08-15 DIAGNOSIS — I10 Essential (primary) hypertension: Secondary | ICD-10-CM

## 2020-08-15 NOTE — Assessment & Plan Note (Signed)
Patient with history of hypertension, currently taking losartan 50 mg and amlodipine 5 mg, both once daily in the mornings.  Today patient hypotensive and was given salt water and chips - pressure up to 92 systolic after 20 minutes.   For now will have brother-in-law check home BP readings every morning.  If systolic pressure is < 014 he should hold both medications.  Systolic 103-013 take losartan 50 mg daily and systolic > 143 can take both medications.  We will see him back in the office in a month for follow up.  He was encouraged to limit himself to no more than 2 of the 40 oz beers per day.

## 2020-08-15 NOTE — Progress Notes (Signed)
08/15/2020 Chase Parsons 1955/05/24 [id number]   HPI:  Chase Parsons is a 65 y.o. male patient of Dr Oval Linsey, with a [place] below who presents today for hypertension clinic evaluation.  He was seen in the ED in January for chest pain  On admission his pressure was 210/130.  He was given SL nitroglycerine and aspirin, after which his chest pain improved.  STEMI was ruled out by EKG and workup for dissection was also negative.  At follow up with PCP pressure was 153/92, at which time he was started on losartan 50 mg daily.  He was next seen in cardiology 2 months later and his pressure was back up to 176/122.  Dr. Oval Linsey added amlodipine 5 mg daily and encouraged compliance with medications.  Social worker notes that she has spoken with patient about resources for alcohol cessation, he currently is living with his sister.  Today he comes in with his brother-in-law for a follow up appointment.  He has been noting an increase in dizziness over the past week.  His brother-in-law answers most of the questions in office today, although patient does chime in on some.  He continues to drink 2+ 40 oz beers each evening.  Brother in law notes that his dizziness is worse with positional changes.  He reports no recent chest pains or lower extremity edema.   His blood pressure is first recorded today at 80/60.  We asked him to drink some water and a repeat pressure about 10 minutes later was 76/58.  At this time he was given some salt water and a small bag of potato chips.  He drank the water and ate about 1/2 the bag of chips.  Another 20 minutes later and his pressure was up to 92/62.     Past Medical History: Alcohol abuse Drinks 40 oz beers, family notes 2 or more on most days; noted to have esophageal varices and hx of rectal bleeding  CAD Noted on CT   cirrhosis No statins for now     Blood Pressure Goal:  130/80  Current Medications: amlodipine 5 mg daily, losartan 50 mg qd  Family Hx:  mother had htn, died about 5-6 years; father w/alcohol issues  Social Hx: no tobacco, 80 +oz beer/day, no regular caffeine  Diet:  Didn't eat breakfast this am, 2-3 40oz beer last night, no time for food this am. Usually some variation of bacon/eggs/toast/pancakes for breakfast; doesn't like fruits and vegetables; last night had chicken nuggets for dinner  Exercise:  None recently, feeling lazy recently - previously walking in neighborhood  Home BP readings: no home readings recently  Intolerances: nkda  Labs: 3/22: Na 135, K 3.2, Glu 148, BUN 9, SCr 1.07     Wt Readings from Last 3 Encounters:  08/15/20 170 lb (77.1 kg)  07/25/20 155 lb (70.3 kg)  07/15/20 164 lb (74.4 kg)   BP Readings from Last 3 Encounters:  08/15/20 92/62  07/25/20 (!) 152/102  07/15/20 (!) 176/122   Pulse Readings from Last 3 Encounters:  08/15/20 74  07/25/20 (!) 116  07/15/20 96    Current Outpatient Medications  Medication Sig Dispense Refill  . acetaminophen (TYLENOL) 500 MG tablet Take 1,000 mg by mouth every 6 (six) hours as needed for mild pain.    Marland Kitchen amLODipine (NORVASC) 5 MG tablet Take 1 tablet (5 mg total) by mouth daily. 90 tablet 3  . FLUoxetine (PROZAC) 10 MG capsule Take 1 capsule by mouth once daily 90  capsule 0  . folic acid (FOLVITE) 1 MG tablet Take 1 tablet by mouth once daily 90 tablet 0  . ibuprofen (ADVIL) 200 MG tablet Take 400 mg by mouth every 6 (six) hours as needed for mild pain.    Marland Kitchen losartan (COZAAR) 50 MG tablet Take 1 tablet (50 mg total) by mouth daily. 30 tablet 11  . ondansetron (ZOFRAN-ODT) 4 MG disintegrating tablet Take 1 tablet (4 mg total) by mouth every 8 (eight) hours as needed for nausea or vomiting. 8 tablet 0  . pantoprazole (PROTONIX) 40 MG tablet Take 1 tablet (40 mg total) by mouth daily. 90 tablet 1  . rifaximin (XIFAXAN) 550 MG TABS tablet Take 550 mg by mouth 2 (two) times daily.    Marland Kitchen thiamine (VITAMIN B-1) 100 MG tablet Take 100 mg by mouth daily.      No current facility-administered medications for this visit.    No Known Allergies  Past Medical History:  Diagnosis Date  . Alcohol abuse   . Alcohol use disorder, moderate, dependence ([place]) 10/24/2019  . Encephalopathy   . Hyponatremia 10/24/2019  . Pneumonia   . Severe single current episode of major depressive disorder, without psychotic features ([place]) 01/26/2015  . Suicidal thoughts     Blood pressure 92/62, pulse 74, resp. rate 16, height 6' (1.829 m), weight 170 lb (77.1 kg), SpO2 90 %.  First BP in office 80/60, then dropped to 76/58.  Final reading above  Severe symptomatic hypertensive urgenrcy  Patient with history of hypertension, currently taking losartan 50 mg and amlodipine 5 mg, both once daily in the mornings.  Today patient hypotensive and was given salt water and chips - pressure up to 92 systolic after 20 minutes.   For now will have brother-in-law check home BP readings every morning.  If systolic pressure is < 150 he should hold both medications.  Systolic 569-794 take losartan 50 mg daily and systolic > 801 can take both medications.  We will see him back in the office in a month for follow up.  He was encouraged to limit himself to no more than 2 of the 40 oz beers per day.     Tommy Medal PharmD CPP [facility] [address] [place], [place] [address] [phone]

## 2020-08-15 NOTE — Patient Instructions (Addendum)
Return for a a follow up appointment May 19 at 9:30 am  Check your blood pressure at home every morning before breakfast.    If top BP number is < 120 - no medications this day  If top BP number is 120-150 -take losartan 50 mg only  If top BP number is > 150 - take both losartan 50 mg and amlodipine 5 mg  Please try to limit to 2 of the 40 oz beer each day.    Bring all of your meds, your BP cuff and your record of home blood pressures to your next appointment.  Exercise as you're able, try to walk approximately 30 minutes per day.  Keep salt intake to a minimum, especially watch canned and prepared boxed foods.  Eat more fresh fruits and vegetables and fewer canned items.  Avoid eating in fast food restaurants.    HOW TO TAKE YOUR BLOOD PRESSURE: . Rest 5 minutes before taking your blood pressure. .  Don't smoke or drink caffeinated beverages for at least 30 minutes before. . Take your blood pressure before (not after) you eat. . Sit comfortably with your back supported and both feet on the floor (don't cross your legs). . Elevate your arm to heart level on a table or a desk. . Use the proper sized cuff. It should fit smoothly and snugly around your bare upper arm. There should be enough room to slip a fingertip under the cuff. The bottom edge of the cuff should be 1 inch above the crease of the elbow. . Ideally, take 3 measurements at one sitting and record the average.

## 2020-08-24 NOTE — Progress Notes (Addendum)
   CC: Hypotension  HPI:  [name] is a 65 y.o. male with history as below presenting for regular visit, found to be hypotensive.  Patient had blood pressure of 65/39 on presentation, reports feeling dizzy.  Brother-in-law is helping with history.  States he has had nausea, vomiting, and diarrhea for the past several days and has had very little oral intake.  History of heavy alcohol use but no drinks in the past few days due to this gastrointestinal illness.  I suspect that he is withdrawing at this time.  Has previously been hospitalized for alcohol withdrawal.  Attempted to place IV line, but patient ripped it out accidentally.  Transferring to emergency room emergently for continued care.  Accompanied patient up to emergency room.  Past Medical History:  Diagnosis Date  . Alcohol abuse   . Alcohol use disorder, moderate, dependence ([place]) 10/24/2019  . Encephalopathy   . Hyponatremia 10/24/2019  . Pneumonia   . Severe single current episode of major depressive disorder, without psychotic features ([place]) 01/26/2015  . Suicidal thoughts    Review of Systems:   Review of Systems  Unable to perform ROS: Acuity of condition     Physical Exam: Vitals:   08/26/20 0923  BP: (!) 65/39  Pulse: (!) 139  Temp: 97.6 F (36.4 C)  TempSrc: Oral  SpO2: 92%  Weight: 149 lb 14.4 oz (68 kg)  Height: 6' (1.829 m)   Constitutional: no acute distress Head: atraumatic ENT: external ears normal Pulmonary: effort normal Abdominal: flat Skin: warm and dry Neurological: Initially alert, became more somnolent over course of encounter Psychiatric: normal mood and affect  Assessment & Plan:   See Encounters Tab for problem based charting.  Patient seen with Dr. Dareen Piano

## 2020-08-26 ENCOUNTER — Ambulatory Visit: Payer: Medicare Other | Admitting: Student

## 2020-08-26 ENCOUNTER — Inpatient Hospital Stay (HOSPITAL_COMMUNITY): Payer: Medicare Other

## 2020-08-26 ENCOUNTER — Inpatient Hospital Stay (HOSPITAL_COMMUNITY)
Admission: EM | Admit: 2020-08-26 | Discharge: 2020-08-30 | DRG: 640 | Disposition: A | Discharge status: Home / Self Care | Payer: Medicare Other | Source: Ambulatory Visit | Attending: Internal Medicine | Admitting: Internal Medicine

## 2020-08-26 ENCOUNTER — Other Ambulatory Visit: Payer: Self-pay

## 2020-08-26 ENCOUNTER — Encounter: Payer: Medicaid Other | Admitting: Student

## 2020-08-26 ENCOUNTER — Emergency Department (HOSPITAL_COMMUNITY): Payer: Medicare Other

## 2020-08-26 ENCOUNTER — Encounter (HOSPITAL_COMMUNITY): Payer: Self-pay | Admitting: *Deleted

## 2020-08-26 VITALS — BP 65/39 | HR 139 | Temp 97.6°F | Ht 72.0 in | Wt 149.9 lb

## 2020-08-26 DIAGNOSIS — E861 Hypovolemia: Secondary | ICD-10-CM

## 2020-08-26 DIAGNOSIS — R413 Other amnesia: Secondary | ICD-10-CM | POA: Diagnosis present

## 2020-08-26 DIAGNOSIS — I952 Hypotension due to drugs: Secondary | ICD-10-CM | POA: Diagnosis present

## 2020-08-26 DIAGNOSIS — Z833 Family history of diabetes mellitus: Secondary | ICD-10-CM | POA: Diagnosis not present

## 2020-08-26 DIAGNOSIS — I10 Essential (primary) hypertension: Secondary | ICD-10-CM | POA: Diagnosis present

## 2020-08-26 DIAGNOSIS — I959 Hypotension, unspecified: Secondary | ICD-10-CM

## 2020-08-26 DIAGNOSIS — Z800 Family history of malignant neoplasm of digestive organs: Secondary | ICD-10-CM

## 2020-08-26 DIAGNOSIS — R112 Nausea with vomiting, unspecified: Secondary | ICD-10-CM

## 2020-08-26 DIAGNOSIS — Z681 Body mass index (BMI) 19 or less, adult: Secondary | ICD-10-CM

## 2020-08-26 DIAGNOSIS — K766 Portal hypertension: Secondary | ICD-10-CM | POA: Diagnosis present

## 2020-08-26 DIAGNOSIS — K921 Melena: Secondary | ICD-10-CM | POA: Diagnosis present

## 2020-08-26 DIAGNOSIS — I9589 Other hypotension: Secondary | ICD-10-CM | POA: Diagnosis present

## 2020-08-26 DIAGNOSIS — D509 Iron deficiency anemia, unspecified: Secondary | ICD-10-CM | POA: Diagnosis present

## 2020-08-26 DIAGNOSIS — K859 Acute pancreatitis without necrosis or infection, unspecified: Secondary | ICD-10-CM | POA: Diagnosis present

## 2020-08-26 DIAGNOSIS — T465X5A Adverse effect of other antihypertensive drugs, initial encounter: Secondary | ICD-10-CM | POA: Diagnosis present

## 2020-08-26 DIAGNOSIS — E871 Hypo-osmolality and hyponatremia: Secondary | ICD-10-CM | POA: Diagnosis present

## 2020-08-26 DIAGNOSIS — R296 Repeated falls: Secondary | ICD-10-CM | POA: Diagnosis present

## 2020-08-26 DIAGNOSIS — E860 Dehydration: Secondary | ICD-10-CM | POA: Diagnosis present

## 2020-08-26 DIAGNOSIS — E872 Acidosis: Secondary | ICD-10-CM | POA: Diagnosis present

## 2020-08-26 DIAGNOSIS — R634 Abnormal weight loss: Secondary | ICD-10-CM | POA: Diagnosis present

## 2020-08-26 DIAGNOSIS — K7030 Alcoholic cirrhosis of liver without ascites: Secondary | ICD-10-CM | POA: Diagnosis present

## 2020-08-26 DIAGNOSIS — Z20822 Contact with and (suspected) exposure to covid-19: Secondary | ICD-10-CM | POA: Diagnosis present

## 2020-08-26 DIAGNOSIS — R7401 Elevation of levels of liver transaminase levels: Secondary | ICD-10-CM

## 2020-08-26 DIAGNOSIS — K2920 Alcoholic gastritis without bleeding: Secondary | ICD-10-CM | POA: Diagnosis present

## 2020-08-26 DIAGNOSIS — R5381 Other malaise: Secondary | ICD-10-CM | POA: Diagnosis present

## 2020-08-26 DIAGNOSIS — T461X5A Adverse effect of calcium-channel blockers, initial encounter: Secondary | ICD-10-CM | POA: Diagnosis present

## 2020-08-26 DIAGNOSIS — F1020 Alcohol dependence, uncomplicated: Secondary | ICD-10-CM | POA: Diagnosis present

## 2020-08-26 DIAGNOSIS — Z79899 Other long term (current) drug therapy: Secondary | ICD-10-CM

## 2020-08-26 DIAGNOSIS — Z8249 Family history of ischemic heart disease and other diseases of the circulatory system: Secondary | ICD-10-CM

## 2020-08-26 DIAGNOSIS — N179 Acute kidney failure, unspecified: Secondary | ICD-10-CM | POA: Diagnosis present

## 2020-08-26 LAB — VITAMIN B12: Vitamin B-12: 243 pg/mL (ref 180–914)

## 2020-08-26 LAB — LACTIC ACID, PLASMA
Lactic Acid, Venous: 6.9 mmol/L (ref 0.5–1.9)
Lactic Acid, Venous: 1.2 mmol/L (ref 0.5–1.9)

## 2020-08-26 LAB — COMPREHENSIVE METABOLIC PANEL
ALT: 126 U/L — ABNORMAL HIGH (ref 0–44)
AST: 153 U/L — ABNORMAL HIGH (ref 15–41)
Albumin: 4 g/dL (ref 3.5–5.0)
Alkaline Phosphatase: 156 U/L — ABNORMAL HIGH (ref 38–126)
Anion gap: 19 — ABNORMAL HIGH (ref 5–15)
BUN: 25 mg/dL — ABNORMAL HIGH (ref 8–23)
CO2: 19 mmol/L — ABNORMAL LOW (ref 22–32)
Calcium: 9.3 mg/dL (ref 8.9–10.3)
Chloride: 93 mmol/L — ABNORMAL LOW (ref 98–111)
Creatinine, Ser: 1.93 mg/dL — ABNORMAL HIGH (ref 0.61–1.24)
GFR, Estimated: 38 mL/min — ABNORMAL LOW (ref >60)
Glucose, Bld: 149 mg/dL — ABNORMAL HIGH (ref 70–99)
Potassium: 4.3 mmol/L (ref 3.5–5.1)
Sodium: 131 mmol/L — ABNORMAL LOW (ref 135–145)
Total Bilirubin: 2.7 mg/dL — ABNORMAL HIGH (ref 0.3–1.2)
Total Protein: 8.6 g/dL — ABNORMAL HIGH (ref 6.5–8.1)

## 2020-08-26 LAB — CBC WITH DIFFERENTIAL/PLATELET
Abs Immature Granulocytes: 0.06 10*3/uL (ref 0.00–0.07)
Basophils Absolute: 0.1 10*3/uL (ref 0.0–0.1)
Basophils Relative: 1 %
Eosinophils Absolute: 0 10*3/uL (ref 0.0–0.5)
Eosinophils Relative: 0 %
HCT: 34.9 % — ABNORMAL LOW (ref 39.0–52.0)
Hemoglobin: 11.4 g/dL — ABNORMAL LOW (ref 13.0–17.0)
Immature Granulocytes: 1 %
Lymphocytes Relative: 25 %
Lymphs Abs: 2.5 10*3/uL (ref 0.7–4.0)
MCH: 33 pg (ref 26.0–34.0)
MCHC: 32.7 g/dL (ref 30.0–36.0)
MCV: 101.2 fL — ABNORMAL HIGH (ref 80.0–100.0)
Monocytes Absolute: 0.8 10*3/uL (ref 0.1–1.0)
Monocytes Relative: 8 %
Neutro Abs: 6.5 10*3/uL (ref 1.7–7.7)
Neutrophils Relative %: 65 %
Platelets: 214 10*3/uL (ref 150–400)
RBC: 3.45 MIL/uL — ABNORMAL LOW (ref 4.22–5.81)
RDW: 12.1 % (ref 11.5–15.5)
WBC: 9.9 10*3/uL (ref 4.0–10.5)
nRBC: 0 % (ref 0.0–0.2)

## 2020-08-26 LAB — URINALYSIS, ROUTINE W REFLEX MICROSCOPIC
Bacteria, UA: NONE SEEN
Bilirubin Urine: NEGATIVE
Glucose, UA: NEGATIVE mg/dL
Ketones, ur: NEGATIVE mg/dL
Leukocytes,Ua: NEGATIVE
Nitrite: NEGATIVE
Protein, ur: NEGATIVE mg/dL
Specific Gravity, Urine: 1.006 (ref 1.005–1.030)
pH: 6 (ref 5.0–8.0)

## 2020-08-26 LAB — PROTIME-INR
INR: 1.2 (ref 0.8–1.2)
Prothrombin Time: 15.6 seconds — ABNORMAL HIGH (ref 11.4–15.2)

## 2020-08-26 LAB — CBG MONITORING, ED: Glucose-Capillary: 107 mg/dL — ABNORMAL HIGH (ref 70–99)

## 2020-08-26 LAB — TSH: TSH: 5.83 u[IU]/mL — ABNORMAL HIGH (ref 0.350–4.500)

## 2020-08-26 LAB — FOLATE: Folate: 23 ng/mL (ref >5.9)

## 2020-08-26 LAB — LIPASE, BLOOD: Lipase: 101 U/L — ABNORMAL HIGH (ref 11–51)

## 2020-08-26 LAB — APTT: aPTT: 25 seconds (ref 24–36)

## 2020-08-26 MED ORDER — LACTATED RINGERS IV BOLUS
1000.0000 mL | Freq: Once | INTRAVENOUS | Status: AC
  Administered 2020-08-26: 1000 mL via INTRAVENOUS

## 2020-08-26 MED ORDER — LACTATED RINGERS IV BOLUS: 1000.0000 mL | Freq: Once | INTRAVENOUS | Status: DC

## 2020-08-26 MED ORDER — SENNOSIDES-DOCUSATE SODIUM 8.6-50 MG PO TABS: 1.0000 | ORAL_TABLET | Freq: Every evening | ORAL | Status: DC | PRN

## 2020-08-26 MED ORDER — THIAMINE HCL 100 MG/ML IJ SOLN
100.0000 mg | Freq: Every day | INTRAMUSCULAR | Status: DC
  Administered 2020-08-26 – 2020-08-27 (×2): 100 mg via INTRAVENOUS
  Filled 2020-08-26 (×2): qty 2

## 2020-08-26 MED ORDER — ONDANSETRON HCL 4 MG/2ML IJ SOLN
4.0000 mg | Freq: Once | INTRAMUSCULAR | Status: AC
  Administered 2020-08-26: 4 mg via INTRAVENOUS
  Filled 2020-08-26: qty 2

## 2020-08-26 MED ORDER — FOLIC ACID 5 MG/ML IJ SOLN
1.0000 mg | Freq: Every day | INTRAMUSCULAR | Status: DC
  Administered 2020-08-26: 1 mg via INTRAVENOUS
  Filled 2020-08-26 (×2): qty 0.2

## 2020-08-26 MED ORDER — LACTATED RINGERS IV SOLN: INTRAVENOUS | Status: DC

## 2020-08-26 MED ORDER — HEPARIN SODIUM (PORCINE) 5000 UNIT/ML IJ SOLN
5000.0000 [IU] | Freq: Three times a day (TID) | INTRAMUSCULAR | Status: DC
  Administered 2020-08-26 – 2020-08-29 (×10): 5000 [IU] via SUBCUTANEOUS
  Filled 2020-08-26 (×9): qty 1

## 2020-08-26 MED ORDER — PANTOPRAZOLE SODIUM 40 MG PO TBEC
40.0000 mg | DELAYED_RELEASE_TABLET | Freq: Every day | ORAL | Status: DC
  Administered 2020-08-27 – 2020-08-28 (×2): 40 mg via ORAL
  Filled 2020-08-26 (×2): qty 1

## 2020-08-26 MED ORDER — FLUOXETINE HCL 10 MG PO CAPS
10.0000 mg | ORAL_CAPSULE | Freq: Every day | ORAL | Status: DC
  Administered 2020-08-27 – 2020-08-30 (×4): 10 mg via ORAL
  Filled 2020-08-26 (×4): qty 1

## 2020-08-26 NOTE — ED Provider Notes (Addendum)
[facility] EMERGENCY DEPARTMENT Provider Note   CSN: [id number] Arrival date & time: 08/26/20  0957     History No chief complaint on file.   Chase Parsons is a 65 y.o. male.  Patient is a 65 year old male with a history of alcohol abuse, hypertension, cirrhosis who is presenting here today from internal medicine clinic due to hypotension and tachycardia.  Patient reports that he has been sick all week and has been having vomiting and diarrhea.  Patient's family member who is present with him corroborates the story.  Patient has been eating and drinking very little to nothing because of the vomiting and diarrhea.  Today when he stood up he became very woozy and family member reports that he almost passed out.  When he got to the internal medicine clinic this morning his blood pressure was 65 over 40s with a heart rate of 139.  Patient currently is lying flat and just reports that he feels sick.  He denies any localized abdominal pain but states it just hurts all over a little bit.  He has not had any hematemesis or melena.  He normally drinks 3 to 4 40 ounce beers and states for the last 2 days he has not had anything to drink because he has not been feeling well.  He denies any fever.  No chest pain, shortness of breath or cough.  Family member does report that he took all of his medications this morning including his losartan 50 mg and amlodipine 5 mg.  Of note patient was seen approximately 1 month ago and was having episodes of hypotension and was told to only take the losartan if blood pressure was greater than 120.  And only take both medications a pressure was greater than 150.  It is unclear if they are checking his blood pressure at home.  The history is provided by the patient and a relative.       Past Medical History:  Diagnosis Date  . Alcohol abuse   . Alcohol use disorder, moderate, dependence ([place]) 10/24/2019  . Encephalopathy   . Hyponatremia 10/24/2019   . Pneumonia   . Severe single current episode of major depressive disorder, without psychotic features ([place]) 01/26/2015  . Suicidal thoughts     Patient Active Problem List   Diagnosis Date Noted  . Elevated total protein 05/21/2020  . Alcoholic cirrhosis ([place]) [id number]  . Severe symptomatic hypertensive urgenrcy  02/29/2020  . Diarrhea 12/06/2019  . Insomnia 12/06/2019  . Healthcare maintenance 12/06/2019  . GERD (gastroesophageal reflux disease) 12/06/2019  . Anemia of chronic disease 12/06/2019  . Physical deconditioning 10/24/2019  . Alcohol use disorder, mild, in controlled environment 10/24/2019  . MDD (major depressive disorder), single episode, severe , no psychosis ([place]) 01/26/2015    Past Surgical History:  Procedure Laterality Date  . BIOPSY  02/20/2020   Procedure: BIOPSY;  Surgeon: Otis Brace, MD;  Location: [facility];  Service: Gastroenterology;;  EGD and COLON  . COLONOSCOPY WITH PROPOFOL N/A 02/20/2020   Procedure: COLONOSCOPY WITH PROPOFOL;  Surgeon: Otis Brace, MD;  Location: [facility];  Service: Gastroenterology;  Laterality: N/A;  . ESOPHAGOGASTRODUODENOSCOPY (EGD) WITH PROPOFOL N/A 02/20/2020   Procedure: ESOPHAGOGASTRODUODENOSCOPY (EGD) WITH PROPOFOL;  Surgeon: Otis Brace, MD;  Location: [facility];  Service: Gastroenterology;  Laterality: N/A;  . POLYPECTOMY  02/20/2020   Procedure: POLYPECTOMY;  Surgeon: Otis Brace, MD;  Location: [facility];  Service: Gastroenterology;;       Family History  Problem  Relation Age of Onset  . Diabetes Mother   . Hypertension Mother   . Pancreatic cancer Mother   . Alcohol abuse Father   . Diabetes Sister   . Hypertension Sister   . Cancer Brother   . Congestive Heart Failure Sister     Social History   Tobacco Use  . Smoking status: Never Smoker  . Smokeless tobacco: Never Used  Vaping Use  . Vaping Use: Never used  Substance Use Topics  . Alcohol use: Yes     Alcohol/week: 4.0 standard drinks    Types: 4 Cans of beer per week  . Drug use: No    Home Medications Prior to Admission medications   Medication Sig Start Date End Date Taking? Authorizing Provider  acetaminophen (TYLENOL) 500 MG tablet Take 1,000 mg by mouth every 6 (six) hours as needed for mild pain.    [provider]  amLODipine (NORVASC) 5 MG tablet Take 1 tablet (5 mg total) by mouth daily. 07/15/20 10/13/20  Skeet Latch, MD  FLUoxetine (PROZAC) 10 MG capsule Take 1 capsule by mouth once daily 07/24/20   Mitzi Hansen, MD  folic acid (FOLVITE) 1 MG tablet Take 1 tablet by mouth once daily 07/24/20   Mitzi Hansen, MD  ibuprofen (ADVIL) 200 MG tablet Take 400 mg by mouth every 6 (six) hours as needed for mild pain.    [provider]  losartan (COZAAR) 50 MG tablet Take 1 tablet (50 mg total) by mouth daily. 05/16/20 05/16/21  Iona Beard, MD  ondansetron (ZOFRAN-ODT) 4 MG disintegrating tablet Take 1 tablet (4 mg total) by mouth every 8 (eight) hours as needed for nausea or vomiting. 07/25/20   Davonna Belling, MD  pantoprazole (PROTONIX) 40 MG tablet Take 1 tablet (40 mg total) by mouth daily. 07/25/20 01/21/21  Davonna Belling, MD  rifaximin (XIFAXAN) 550 MG TABS tablet Take 550 mg by mouth 2 (two) times daily.    [provider]  thiamine (VITAMIN B-1) 100 MG tablet Take 100 mg by mouth daily. 11/07/19   [provider]    Allergies    Patient has no known allergies.  Review of Systems   Review of Systems  All other systems reviewed and are negative.   Physical Exam Updated Vital Signs BP 92/71 (BP Location: Left Arm)   Pulse (!) 102   Temp 99.2 F (37.3 C) (Oral)   Resp 20   Ht 6' (1.829 m)   Wt 63.5 kg   SpO2 100%   BMI 18.99 kg/m   Physical Exam Vitals and nursing note reviewed.  Constitutional:      General: He is not in acute distress.    Appearance: He is well-developed. He is ill-appearing.  HENT:      Head: Normocephalic and atraumatic.     Mouth/Throat:     Mouth: Mucous membranes are dry.  Eyes:     Conjunctiva/sclera: Conjunctivae normal.     Pupils: Pupils are equal, round, and reactive to light.  Cardiovascular:     Rate and Rhythm: Regular rhythm. Tachycardia present.     Heart sounds: No murmur heard.   Pulmonary:     Effort: Pulmonary effort is normal. No respiratory distress.     Breath sounds: Normal breath sounds. No wheezing or rales.  Abdominal:     General: There is no distension.     Palpations: Abdomen is soft.     Tenderness: There is abdominal tenderness. There is no guarding or rebound.  Comments: Mild diffuse tenderness  Musculoskeletal:        General: No tenderness. Normal range of motion.     Cervical back: Normal range of motion and neck supple.     Right lower leg: No edema.     Left lower leg: No edema.  Skin:    General: Skin is warm and dry.     Findings: No erythema or rash.  Neurological:     Mental Status: He is oriented to person, place, and time. He is lethargic.  Psychiatric:        Mood and Affect: Affect is flat.        Behavior: Behavior normal.     ED Results / Procedures / Treatments   Labs (all labs ordered are listed, but only abnormal results are displayed) Labs Reviewed  CBC WITH DIFFERENTIAL/PLATELET - Abnormal; Notable for the following components:      Result Value   RBC 3.45 (*)    Hemoglobin 11.4 (*)    HCT 34.9 (*)    MCV 101.2 (*)    All other components within normal limits  COMPREHENSIVE METABOLIC PANEL - Abnormal; Notable for the following components:   Sodium 131 (*)    Chloride 93 (*)    CO2 19 (*)    Glucose, Bld 149 (*)    BUN 25 (*)    Creatinine, Ser 1.93 (*)    Total Protein 8.6 (*)    AST 153 (*)    ALT 126 (*)    Alkaline Phosphatase 156 (*)    Total Bilirubin 2.7 (*)    GFR, Estimated 38 (*)    Anion gap 19 (*)    All other components within normal limits  LIPASE, BLOOD - Abnormal;  Notable for the following components:   Lipase 101 (*)    All other components within normal limits  LACTIC ACID, PLASMA - Abnormal; Notable for the following components:   Lactic Acid, Venous 6.9 (*)    All other components within normal limits  CBG MONITORING, ED - Abnormal; Notable for the following components:   Glucose-Capillary 107 (*)    All other components within normal limits  URINALYSIS, ROUTINE W REFLEX MICROSCOPIC    EKG EKG Interpretation  Date/Time:  Monday Aug 26 2020 10:04:10 EDT Ventricular Rate:  101 PR Interval:  170 QRS Duration: 95 QT Interval:  340 QTC Calculation: 441 R Axis:   71 Text Interpretation: Sinus tachycardia No significant change since last tracing Confirmed by Blanchie Dessert [phone] on 08/26/2020 10:59:19 AM   Radiology CT ABDOMEN PELVIS WO CONTRAST  Result Date: 08/26/2020 CLINICAL DATA:  Acute generalized abdominal pain. Weakness. Vomiting. Diarrhea. EXAM: CT ABDOMEN AND PELVIS WITHOUT CONTRAST TECHNIQUE: Multidetector CT imaging of the abdomen and pelvis was performed following the standard protocol without IV contrast. COMPARISON:  02/22/2020 CT abdomen/pelvis. 05/13/2020 CT angiogram of the chest, abdomen and pelvis. FINDINGS: Lower chest: No significant pulmonary nodules or acute consolidative airspace disease. Hepatobiliary: Diffuse hepatic steatosis. Finely irregular liver surface, suggesting cirrhosis. No liver masses on this noncontrast study. Normal gallbladder with no radiopaque cholelithiasis. No biliary ductal dilatation. Pancreas: There is mild haziness of the peripancreatic fat at the pancreatic head suggesting acute pancreatitis. No discrete pancreatic mass or duct dilation. Spleen: Normal size. No mass. Adrenals/Urinary Tract: Normal adrenals. At least 5 nonobstructing stones scattered in the right kidney, largest 3 mm in the interpolar right kidney. Nonobstructing 4 mm interpolar left renal stone. No hydronephrosis. No contour  deforming renal masses.  Normal caliber ureters. No ureteral stones. Normal bladder with no bladder stones. Stomach/Bowel: Normal non-distended stomach. Normal caliber small bowel with no small bowel wall thickening. Normal appendix. Mild diffuse colonic diverticulosis with no large bowel wall thickening or significant pericolonic fat stranding. Vascular/Lymphatic: Atherosclerotic nonaneurysmal abdominal aorta. No pathologically enlarged lymph nodes in the abdomen or pelvis. Reproductive: Top-normal size prostate. Other: No pneumoperitoneum, ascites or focal fluid collection. Musculoskeletal: No aggressive appearing focal osseous lesions. IMPRESSION: 1. Mild haziness of the peripancreatic fat at the pancreatic head, suggesting acute pancreatitis. No discrete pancreatic mass or duct dilation. No biliary ductal dilatation. No radiopaque cholelithiasis. Suggest correlation with serum lipase level. 2. Nonobstructing bilateral nephrolithiasis. No hydronephrosis. 3. Diffuse hepatic steatosis. Finely irregular liver surface, suggesting cirrhosis. No liver masses on this noncontrast study. 4. Mild diffuse colonic diverticulosis. 5. Aortic Atherosclerosis (ICD10-I70.0). Electronically Signed   By: Ilona Sorrel M.D.   On: 08/26/2020 13:50    Procedures Procedures   Medications Ordered in ED Medications  ondansetron (ZOFRAN) injection 4 mg (has no administration in time range)  lactated ringers bolus 1,000 mL (has no administration in time range)    ED Course  I have reviewed the triage vital signs and the nursing notes.  Pertinent labs & imaging results that were available during my care of the patient were reviewed by me and considered in my medical decision making (see chart for details).    MDM Rules/Calculators/A&P                          65 year old male presenting today with 1 week of vomiting and diarrhea, poor oral intake and went to his PCP office today and found to be hypotensive and  tachycardic.  Patient has mild diffuse abdominal pain but no localized findings.  Low suspicion for anemia today.  Patient also took 2 blood pressure medications this morning and suspect that that is also adding into his hypotension.  Concern for severe diet dehydration, possible electrolyte abnormalities, concern for AKI.  Will give IV fluids, Zofran.  Will follow blood pressure.  With lying down patient's blood pressure is 90/60 with a heart rate of 101.  11:33 AM CBC without acute findings, CMP with new AKI with creatinine of 1.93, transaminitis with AST of 153, ALT of 126 and total bilirubin of 2.7.  Anion gap of 19 and a lactic acidosis of 6.9.  Patient's lipase is mildly elevated at 101.  We will do CT to rule out any other acute pathology.  Patient given second liter of fluid and started on a rate.  Will admit for further care for severe dehydration, transaminitis and aki.  Ct with signs of pancreatitis.  MDM Number of Diagnoses or Management Options   Amount and/or Complexity of Data Reviewed Clinical lab tests: ordered and reviewed Tests in the radiology section of CPT: ordered and reviewed Tests in the medicine section of CPT: ordered and reviewed Decide to obtain previous medical records or to obtain history from someone other than the patient: yes Obtain history from someone other than the patient: yes Review and summarize past medical records: yes Discuss the patient with other providers: yes Independent visualization of images, tracings, or specimens: yes  Risk of Complications, Morbidity, and/or Mortality Presenting problems: high Diagnostic procedures: high Management options: high  Patient Progress Patient progress: improved  CRITICAL CARE Performed by: Aviance [place] Total critical care time: 30 minutes Critical care time was exclusive of separately billable procedures and treating  other patients. Critical care was necessary to treat or prevent imminent or  life-threatening deterioration. Critical care was time spent personally by me on the following activities: development of treatment plan with patient and/or surrogate as well as nursing, discussions with consultants, evaluation of patient's response to treatment, examination of patient, obtaining history from patient or surrogate, ordering and performing treatments and interventions, ordering and review of laboratory studies, ordering and review of radiographic studies, pulse oximetry and re-evaluation of patient's condition.   Final Clinical Impression(s) / ED Diagnoses Final diagnoses:  Hypotension, unspecified hypotension type  AKI (acute kidney injury) ([place])  Transaminitis  Nausea vomiting and diarrhea  Dehydration    Rx / DC Orders ED Discharge Orders    None       Blanchie Dessert, MD 08/26/20 1136    Blanchie Dessert, MD 08/26/20 1459

## 2020-08-26 NOTE — ED Notes (Signed)
Dr. Johny Blamer aware of lactic of 6.9

## 2020-08-26 NOTE — H&P (Addendum)
Date: 08/26/2020               Patient Name:  Chase Parsons MRN: [id number]  DOB: 1955/12/05 Age / Sex: 65 y.o., male   PCP: Andrew Au, MD              Medical Service: Internal Medicine Teaching Service              Attending Physician: Dr. Lucious Groves, DO    First Contact: Jacqlyn Larsen, MS4 Pager: [phone]  Second Contact: Dr. Maudie Mercury  Pager: [phone]       After Hours (After 5p/  First Contact Pager: [phone]  weekends / holidays): Second Contact Pager: [phone]   Chief Complaint: Hypotension, tachycardia, dizziness, falls   History of Present Illness: Chase Parsons is a 65 yo male with PMH with hypertension, cirrhosis, alcohol use disorder, diverticulosis who presents to the emergency department from internal medicine clinic with hypotension, tachycardia, tachycardia, dizziness, and falls. Patient reports the dizziness and falls started about a week ago. He reports he feels dizziness mostly when transitioning from sitting or laying to standing. The patient reports seeing black spots and headaches during the episodes of dizziness. He does not endorse chest pain, palpitations, or shortness of breath associated with these episodes. He has hit his head during the falls, which have occurred about 8 times over the last week. Reports abdominal pain, nausea, non-bloody vomiting, non-bloody diarrhea, and loss of appetite over the past week as well. Endorses chills and sweating, but denies fevers, urinary symptoms. Unintentional weight loss of 30 lbs (170 lbs 08/15/20) and today he is 140 lbs.Additionally, the patient states he has been losing his memory, but cannot provide a clear timeline. He reports getting lost on his way to grocery store several times.   His brother-in-law and sister help manage his medications. He reports taking all of his medications daily, including blood pressure (losartan 50 mg and 5 mg amlodipine) medications. Per chart review, patient was seen 1 month  ago and was having episodes of hypotension and was told to only take the losartan if blood pressure 120-150. And only take both medications if pressure was greater than 150.   No tobacco use. Drinks two 40 oz beers a day, but has not been drinking for the past week due to loss of appetite, nausea, and vomiting. State he has been drinking since he was 65 years old, but unclear how much alcohol he consumed in the past. No other drug use. Lives with his sister and brother-in-law. Unsure of family history, but reports both his mother and father have passed away. Reports he has 9 sibling, and 2 of his brothers and 1 sister are living.   ED Course: Upon presentation to ED afebrile, hypotensive to 65/39, increased to 90-100s/70s. MAPs in 70-100s. Tachycardic to 90-100s. Respirations 14-19. EKG sinus tachycardia. Given IV fluids and zofran. Blood pressure improved with two boluses of IV fluids. CBC without acute findings, CMP with new AKI with creatinine of 1.93, transaminitis with AST of 153, ALT of 126 and total bilirubin of 2.7. Anion gap of 19 and a lactic acidosis of 6.9. Patient's lipase is mildly elevated at 101. CT with signs of pancreatitis.  Meds: Current Outpatient Medications  Medication Instructions  . amLODipine (NORVASC) 5 mg, Oral, Daily  . FLUoxetine (PROZAC) 10 MG capsule Take 1 capsule by mouth once daily  . folic acid (FOLVITE) 1 MG tablet Take 1 tablet by mouth once daily  . losartan (COZAAR)  50 mg, Oral, Daily  . ondansetron (ZOFRAN-ODT) 4 mg, Oral, Every 8 hours PRN  . pantoprazole (PROTONIX) 40 mg, Oral, Daily  . thiamine (VITAMIN B-1) 100 mg, Oral, Daily   Allergies: Allergies as of 08/26/2020  . (No Known Allergies)   Past Medical History:  Diagnosis Date  . Alcohol abuse   . Alcohol use disorder, moderate, dependence ([place]) 10/24/2019  . Encephalopathy   . Hyponatremia 10/24/2019  . Pneumonia   . Severe single current episode of major depressive disorder, without psychotic  features ([place]) 01/26/2015  . Suicidal thoughts    Family History: Family History  Problem Relation Age of Onset  . Diabetes Mother   . Hypertension Mother   . Pancreatic cancer Mother   . Alcohol abuse Father   . Diabetes Sister   . Hypertension Sister   . Cancer Brother   . Congestive Heart Failure Sister    Social History:  No tobacco use. Drinks two 40 oz beers a day, but has not been drinking for the past week due to loss of appetite, nausea, and vomiting. State he has been drinking since he was 65 years old, but unclear how much alcohol he consumed in the past. No other drug use. Lives with his sister and brother-in-law.   Review of Systems: A complete ROS was negative except as per HPI.  Physical Exam: Blood pressure 127/80, pulse (!) 108, temperature 99.2 F (37.3 C), temperature source Oral, resp. rate 17, height 6' (1.829 m), weight 63.5 kg, SpO2 99 %.  Physical Exam Constitutional:      Comments: Male laying down in bed, uncomfortable appearing, responding appropriately to questions, speaking softly  HENT:     Head: Atraumatic.  Eyes:     Conjunctiva/sclera: Conjunctivae normal.  Cardiovascular:     Rate and Rhythm: Normal rate and regular rhythm.     Heart sounds: Normal heart sounds.  Pulmonary:     Effort: Pulmonary effort is normal.     Breath sounds: Normal breath sounds.  Abdominal:     Palpations: Abdomen is soft.     Comments: Bowel sounds decreased, tenderness to palpation on left side of abdomen  Skin:    General: Skin is warm and dry.  Neurological:     General: No focal deficit present.     Comments: AxO x2 (person and place) not to time or situation. No asterixis.  Psychiatric:        Behavior: Behavior normal.    EKG: Sinus tachycardia, no ST changes   CT abdomen pelvis:  IMPRESSION: 1. Mild haziness of the peripancreatic fat at the pancreatic head, suggesting acute pancreatitis. No discrete pancreatic mass or duct dilation. No biliary  ductal dilatation. No radiopaque cholelithiasis. Suggest correlation with serum lipase level. 2. Nonobstructing bilateral nephrolithiasis. No hydronephrosis. 3. Diffuse hepatic steatosis. Finely irregular liver surface, suggesting cirrhosis. No liver masses on this noncontrast study. 4. Mild diffuse colonic diverticulosis. 5. Aortic Atherosclerosis (ICD10-I70.0).   Assessment & Plan by Problem: Active Problems:   Hypotension due to hypovolemia  Hypotension due to hypovolemia  Dizziness, falls  Patient presents to ED from internal medicine clinic with 1 week of dizziness and falls, found to be hypotensive, tachycardic, and dehydrated. This is most likely secondary to daily use of losartan 50 and  amlodipine 5 mg. Was seen in hypertension clinic 1 month ago and found to be hypotensive. Instructed to take losartan only if BP >852 systolic and both losartan and amlodipine if >150.  Additionally, patient reports  history of heavy alcohol consumption which could be contributing to his dizziness and falls, however has not been consuming alcohol for the past 1 week. Plan to hold blood pressure medications and continue IV fluids.   -Hold losartan and amlodipine -Continue to monitor BP and HR  -IV LR fluids @ 125 ml/hr  -PT and OT evaluations for falls   Memory Loss  Patient reports getting lost on his way to grocery store in his neighborhood on several occasions and on interview today was not oriented to time or situation. Patient could also be more disoriented in the setting of hypotension and dehydration. Hepatic encephalopathy less likely as patient has been adherent to rifaximin at home, no stigmata of liver cirrhosis, has been improving with IV fluids, and no asterixis on exam. Given patient has had several falls, will also check CT head for possible subdural hematoma. Will work up for reversible causes of memory loss/dementia.   -f/u head CT  -TSH  -Vitamin B12  -Folate   Lactic acidosis   Lactate elevated to 6.9 and anion gap of 19. Most likely type a lactic acidosis 2/2 to decreased systemic blood pressure. History of chronic alcohol use and hepatic dysfunction also likely contributing to elevated lactic in setting of hypotension.  -Trend lactate  -IV LR fluids @ 125 ml/hr   Elevated lipase  Patient presents with nausea, vomiting, and diarrhea and CT abdomen revealed findings of acute pancreatitis. Lipase mildly elevated to 101. This could be a result of history of heavy alcohol use and/or severe dehydration.   AKI  Creatinine of 1.93. Most likely due to daily doses of losartan.   -Hold losartan -IV LR fluids @ 125 ml/hr -recheck BMP   Weight loss  Patient has had 30 lb weight loss since 07/2020. On ED labs on 05/13/2020 patient had elevated total protein, normal albumin, and mild anemia. Seen in internal medicine clinic 05/21/2020 and proposed workup for multiple myeloma. On labs upon ED presentation today, continues to have elevated total protein at 8.6, albumin 4.0, hgb 11.4.   -Consider multiple myeloma work up with [facility]  Transaminitis  Cirrhosis 2/2 Alcohol Use Disorder  Has a history of heavy alcohol use and found to have transaminitis, although has not consumed alcohol in 1 week. Transaminitis is likely due to hypotension and dehydration. Expect improvement with fluid resuscitation.   -IV LR fluids @ 125 ml/hr -Monitor liver enzymes  -Check aPTT and INR   Hypertension   Holding home medications due to severe hypotension   Dispo: Admit patient to Observation with expected length of stay less than 2 midnights.  Signed: Jacqlyn Larsen, Medical Student 08/26/2020, 3:02 PM   Pager: [phone] After 5pm on weekdays and 1pm on weekends: On Call pager: [phone]  Attestation for Student Documentation:  I personally was present and performed or re-performed the history, physical exam and medical decision-making activities of this service and have verified that  the service and findings are accurately documented in the student's note.  Maudie Mercury, MD 08/26/2020, 6:54 PM

## 2020-08-26 NOTE — ED Notes (Signed)
Brother -in -law Herbie [place] [phone]

## 2020-08-26 NOTE — ED Notes (Signed)
Tele-transport is in  

## 2020-08-27 ENCOUNTER — Encounter (HOSPITAL_COMMUNITY): Payer: Self-pay | Admitting: Internal Medicine

## 2020-08-27 DIAGNOSIS — I959 Hypotension, unspecified: Secondary | ICD-10-CM

## 2020-08-27 LAB — CBC
HCT: 24.4 % — ABNORMAL LOW (ref 39.0–52.0)
HCT: 23.8 % — ABNORMAL LOW (ref 39.0–52.0)
Hemoglobin: 8.2 g/dL — ABNORMAL LOW (ref 13.0–17.0)
Hemoglobin: 7.7 g/dL — ABNORMAL LOW (ref 13.0–17.0)
MCH: 32.5 pg (ref 26.0–34.0)
MCH: 32.5 pg (ref 26.0–34.0)
MCHC: 33.6 g/dL (ref 30.0–36.0)
MCHC: 32.4 g/dL (ref 30.0–36.0)
MCV: 96.8 fL (ref 80.0–100.0)
MCV: 100.4 fL — ABNORMAL HIGH (ref 80.0–100.0)
Platelets: 177 10*3/uL (ref 150–400)
Platelets: 191 10*3/uL (ref 150–400)
RBC: 2.52 MIL/uL — ABNORMAL LOW (ref 4.22–5.81)
RBC: 2.37 MIL/uL — ABNORMAL LOW (ref 4.22–5.81)
RDW: 11.9 % (ref 11.5–15.5)
RDW: 12.2 % (ref 11.5–15.5)
WBC: 7.5 10*3/uL (ref 4.0–10.5)
WBC: 6.4 10*3/uL (ref 4.0–10.5)
nRBC: 0 % (ref 0.0–0.2)
nRBC: 0 % (ref 0.0–0.2)

## 2020-08-27 LAB — COMPREHENSIVE METABOLIC PANEL
ALT: 78 U/L — ABNORMAL HIGH (ref 0–44)
AST: 73 U/L — ABNORMAL HIGH (ref 15–41)
Albumin: 3 g/dL — ABNORMAL LOW (ref 3.5–5.0)
Alkaline Phosphatase: 114 U/L (ref 38–126)
Anion gap: 8 (ref 5–15)
BUN: 15 mg/dL (ref 8–23)
CO2: 25 mmol/L (ref 22–32)
Calcium: 8.4 mg/dL — ABNORMAL LOW (ref 8.9–10.3)
Chloride: 99 mmol/L (ref 98–111)
Creatinine, Ser: 1.11 mg/dL (ref 0.61–1.24)
GFR, Estimated: >60 mL/min (ref >60)
Glucose, Bld: 88 mg/dL (ref 70–99)
Potassium: 3.6 mmol/L (ref 3.5–5.1)
Sodium: 132 mmol/L — ABNORMAL LOW (ref 135–145)
Total Bilirubin: 2.3 mg/dL — ABNORMAL HIGH (ref 0.3–1.2)
Total Protein: 6.3 g/dL — ABNORMAL LOW (ref 6.5–8.1)

## 2020-08-27 LAB — T4, FREE: Free T4: 0.84 ng/dL (ref 0.61–1.12)

## 2020-08-27 LAB — SARS CORONAVIRUS 2 (TAT 6-24 HRS): SARS Coronavirus 2: NEGATIVE

## 2020-08-27 MED ORDER — THIAMINE HCL 100 MG PO TABS
100.0000 mg | ORAL_TABLET | Freq: Every day | ORAL | Status: DC
  Administered 2020-08-28 – 2020-08-30 (×3): 100 mg via ORAL
  Filled 2020-08-27 (×3): qty 1

## 2020-08-27 MED ORDER — VITAMIN B-12 1000 MCG PO TABS
1000.0000 ug | ORAL_TABLET | Freq: Every day | ORAL | Status: DC
  Administered 2020-08-27 – 2020-08-30 (×4): 1000 ug via ORAL
  Filled 2020-08-27 (×4): qty 1

## 2020-08-27 MED ORDER — FOLIC ACID 1 MG PO TABS
1.0000 mg | ORAL_TABLET | Freq: Every day | ORAL | Status: DC
  Administered 2020-08-27 – 2020-08-30 (×4): 1 mg via ORAL
  Filled 2020-08-27 (×4): qty 1

## 2020-08-27 NOTE — Evaluation (Addendum)
Occupational Therapy Evaluation Patient Details Name: Chase Parsons MRN: [id number] DOB: 11-10-1955 Today's Date: 08/27/2020    History of Present Illness Pt adm 08/26/20 with dizziness and falls. Pt found to be hypotensive (65/39) and tachycardic. Pt also with unintentional 30# wt loss since 08/15/20 as well as memory loss. PMH - HTN, cirrhosis, ETOH use disorder.   Clinical Impression   Pt PTA Pt living with sister. Pt reports dizziness with falls. Pt currently limited by dizziness with positional change from sit to standing and for prolonged periods of standing. Pt requiring increased assist for ADL and requires seated rest breaks for safety and for dizziness. Pt stood x3 mins prior to needing to sit down. BP taken and it was stable. Pt with good immediate recall, but poor recall after 5 and 10 mins even with description of items. VSS. BP in sitting: 115/73, 91 BPM; standing 106/67 100 BPM, unable to get standing after 3 mins as pt remained dizzy. Pt would benefit from continued OT skilled services. OT following acutely.    Follow Up Recommendations  Outpatient OT;Other (comment) (see an eye doctor for chronic blurry vision)    Equipment Recommendations  3 in 1 bedside commode    Recommendations for Other Services       Precautions / Restrictions Precautions Precautions: Fall Restrictions Weight Bearing Restrictions: No      Mobility Bed Mobility Overal bed mobility: Needs Assistance Bed Mobility: Supine to Sit     Supine to sit: Min guard     General bed mobility comments: assist for arm placement on bed to assist with scooting to EOB    Transfers Overall transfer level: Needs assistance Equipment used: 4-wheeled walker Transfers: Sit to/from Stand Sit to Stand: Supervision         General transfer comment: Assist for safety.    Balance Overall balance assessment: Needs assistance Sitting-balance support: No upper extremity supported;Feet supported Sitting  balance-Leahy Scale: Good     Standing balance support: Single extremity supported Standing balance-Leahy Scale: Poor Standing balance comment: holding onto sink for stability                           ADL either performed or assessed with clinical judgement   ADL Overall ADL's : Needs assistance/impaired Eating/Feeding: Set up;Sitting   Grooming: Set up;Sitting   Upper Body Bathing: Set up;Sitting   Lower Body Bathing: Minimal assistance;Sitting/lateral leans   Upper Body Dressing : Set up;Sitting   Lower Body Dressing: Minimal assistance;Sitting/lateral leans;Sit to/from stand   Toilet Transfer: Minimal assistance;Cueing for safety   Toileting- Clothing Manipulation and Hygiene: Minimal assistance;Cueing for safety;Sitting/lateral lean;Sit to/from stand       Functional mobility during ADLs: Min guard;Rolling walker;Cueing for safety General ADL Comments: Pt limited by dizziness with positional change from sit to standing and for prolonged periods of standing. Pt requiring increased assist for ADL and requires seated rest breaks for safety and for dizziness. Pt stood x3 mins prior to needing to sit down. BP taken and it was stable.     Vision Baseline Vision/History: Cataracts (Pt reports blurriness at baseline) Patient Visual Report: Blurring of vision (blurring of vision is not new; pt does not have glasses and reports blurriness with smaller print. Pt able to read clock on wall for time, date and day of week.) Vision Assessment?: Yes Eye Alignment: Within Functional Limits Ocular Range of Motion: Within Functional Limits     Perception  Praxis      Pertinent Vitals/Pain Pain Assessment: No/denies pain     Hand Dominance Right   Extremity/Trunk Assessment Upper Extremity Assessment Upper Extremity Assessment: Generalized weakness   Lower Extremity Assessment Lower Extremity Assessment: Generalized weakness   Cervical / Trunk  Assessment Cervical / Trunk Assessment: Normal   Communication Communication Communication: No difficulties   Cognition Arousal/Alertness: Awake/alert Behavior During Therapy: Flat affect Overall Cognitive Status: History of cognitive impairments - at baseline                                 General Comments: Pt reports memory loss. Pt with immediate recall 3/3 times; after 5 mins of recalling 3 objects 1/3 and after 10 mins 1/3 with no cues and 2/3 with description. Pt unable to spell 'world' backwards and unable to count down from 100 by 5s. Pt did countdown from 20 to 1, but skipped 11.   General Comments  VSS. BP in sitting: 115/73, 91 BPM; standing 106/67 100 BPM, unable to get standing after 3 mins as pt remained dizzy.    Exercises     Shoulder Instructions      Home Living Family/patient expects to be discharged to:: Private residence Living Arrangements: Other relatives (sister) Available Help at Discharge: Family;Available PRN/intermittently Type of Home: House Home Access: Stairs to enter [employer] of Steps: 4 Entrance Stairs-Rails: None Home Layout: One level     Bathroom Shower/Tub: Teacher, early years/pre: Standard     Home Equipment: Environmental consultant - 4 wheels;Shower seat          Prior Functioning/Environment Level of Independence: Independent with assistive device(s)                 OT Problem List: Decreased strength;Decreased activity tolerance;Impaired balance (sitting and/or standing);Decreased cognition;Decreased coordination;Pain;Decreased safety awareness      OT Treatment/Interventions: Self-care/ADL training;Therapeutic exercise;Energy conservation;Therapeutic activities;Patient/family education;Balance training    OT Goals(Current goals can be found in the care plan section) Acute Rehab OT Goals Patient Stated Goal: not stated OT Goal Formulation: With patient Time For Goal Achievement:  09/10/20 Potential to Achieve Goals: Good ADL Goals Pt Will Perform Lower Body Dressing: with modified independence;sit to/from stand Pt Will Transfer to Toilet: with supervision;ambulating Additional ADL Goal #1: Pt state 3 fall prevention strategies to decrease risk of falls with dizziness. Additional ADL Goal #2: Pt will perform OOB ADL with supervisionA in standing x5 mins with no cues for safety.  OT Frequency: Min 2X/week   Barriers to D/C:            Co-evaluation              AM-PAC OT "6 Clicks" Daily Activity     Outcome Measure Help from another person eating meals?: None Help from another person taking care of personal grooming?: A Little Help from another person toileting, which includes using toliet, bedpan, or urinal?: A Little Help from another person bathing (including washing, rinsing, drying)?: A Little Help from another person to put on and taking off regular upper body clothing?: A Little Help from another person to put on and taking off regular lower body clothing?: A Little 6 Click Score: 19   End of Session Equipment Utilized During Treatment: Gait belt;Rolling walker Nurse Communication: Mobility status  Activity Tolerance: Patient tolerated treatment well Patient left: in chair;with call bell/phone within reach;with chair alarm set  OT Visit Diagnosis:  Unsteadiness on feet (R26.81);Muscle weakness (generalized) (M62.81);Other symptoms and signs involving cognitive function                Time: 6734-1937 OT Time Calculation (min): 38 min Charges:  OT General Charges $OT Visit: 1 Visit OT Evaluation $OT Eval Moderate Complexity: 1 Mod OT Treatments $Self Care/Home Management : 8-22 mins $Therapeutic Activity: 8-22 mins  Jefferey Pica, OTR/L Acute Rehabilitation Services Pager: [phone] Office: [place] [facility] 08/27/2020, 4:07 PM

## 2020-08-27 NOTE — Progress Notes (Signed)
New Admission Note:   Arrival Method: stretcher Mental Orientation: alet and oriented x4 Telemetry: box 10 Assessment: Completed Skin: see flowsheet IV: LR at 125cc Pain: none Tubes: none Safety Measures: Safety Fall Prevention Plan has been discussed Admission: Completed [facility] Orientation: Patient has been orientated to the room, unit and staff.  Family: none at bedside  Orders have been reviewed and implemented. Will continue to monitor the patient. Call light has been placed within reach and bed alarm has been activated.   Rockie Neighbours BSN, RN Phone number: [phone]

## 2020-08-27 NOTE — Progress Notes (Signed)
Subjective:  Chase Parsons was seen and evaluated at bedside. He states that he still feels dizzy, but has not had any falls. He is concerned today about his liver as he was told his liver enzymes were elevated. Discussed that his findings are likely in the setting of his hypovolemia due to poor PO intake and BP medication use.  Objective:  Vital signs in last 24 hours: Vitals:   08/26/20 1800 08/26/20 2109 08/26/20 2218 08/27/20 0412  BP: (!) 152/111 128/88 124/82 129/88  Pulse:  98 94 89  Resp: (!) _0 Temp: 98.8 F (37.1 C) 98 F (36.7 C) 98.6 F (37 C) 99.4 F (37.4 C)  TempSrc: Oral Oral Oral Oral  SpO2: 95% 100% 94% 99%  Weight:      Height:       Weight change:   Intake/Output Summary (Last 24 hours) at 08/27/2020 0703 Last data filed at 08/27/2020 0400 Gross per 24 hour  Intake 1724.95 ml  Output --  Net 1724.95 ml   Assessment/Plan:  Active Problems:   Hypotension due to hypovolemia  Hypotension due to hypovolemia  Dizziness, falls  Patient presents to ED from internal medicine clinic with 1 week of dizziness and falls, found to be hypotensive, tachycardic, and dehydrated. This is most likely secondary to daily use of losartan 50 and  amlodipine 5 mg. Was seen in hypertension clinic 1 month ago and found to be hypotensive. Instructed to take losartan only if BP >916 systolic and both losartan and amlodipine if >150.  BP within normal limits, no longer hypotensive. Continues to report dizziness, but has not fallen. Denies grossly blood stools or melena. Hemoglobin 8.2 this AM, but most likely due to hemodilution as other cell counts were down as well. Will continue to monitor symptoms and recheck CBC this afternoon. Will check AM cortisol to evaluate for possible adrenal insufficiency given persistent dizziness and weight loss.   -Follow up PM CBC  -Follow up AM cortisol  -Continue to hold losartan and amlodipine -IV LR fluids @ 125 ml/hr  -Follow up PT  and OT evaluations for falls   Memory Loss  Patient reports getting lost on his way to grocery store in his neighborhood on several occasions and on initial interview only oriented to person and place.   Given history of falls - head CT negative. B12 and folate within normal limits. TSH slightly elevated at 5.830, but not enough to be etiology of memory changes. Will check free T4.   -Follow up Free T4   Elevated lipase  Patient presents with nausea, vomiting, and diarrhea and CT abdomen revealed findings of acute pancreatitis. Lipase mildly elevated to 101. This could be a result of history of heavy alcohol use and/or severe dehydration.   -Hold losartan -IV LR fluids @ 125 ml/hr -recheck CMP  Weight loss  Patient has had 30 lb weight loss since 07/2020. On ED labs on 05/13/2020 patient had elevated total protein, normal albumin, and mild anemia. Seen in internal medicine clinic 05/21/2020 and proposed workup for multiple myeloma. On labs upon ED presentation today, continues to have elevated total protein at 8.6, albumin 4.0, hgb 11.4.   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 Liver enzymes have improved with fluid resuscitation and aPTT (25) and INR (1.2) within normal limits.   -continue to monitor liver enzymes  -IV LR  fluids @ 125 ml/hr   Lactic acidosis (resolved) Lactate down to 1.2 from to 6.9 and normal anion gap of 8.   AKI (resolved)  Creatinine of down to 1.11 from 1.93. Improved with IV fluids and continue to hold BP meds.   Hypertension   Holding home medications due to severe hypotension.   LOS: 1 day   Jacqlyn Larsen, Medical Student 08/27/2020, 7:03 AM  Pager: [phone] After 5pm on weekdays and 1pm on weekends: On Call pager: [phone]

## 2020-08-27 NOTE — Plan of Care (Signed)
  Problem: Nutrition: Goal: Adequate nutrition will be maintained Outcome: Progressing   Problem: Coping: Goal: Level of anxiety will decrease Outcome: Progressing   Problem: Elimination: Goal: Will not experience complications related to urinary retention Outcome: Progressing   Problem: Pain Managment: Goal: General experience of comfort will improve Outcome: Progressing   

## 2020-08-27 NOTE — Evaluation (Signed)
Physical Therapy Evaluation Patient Details Name: Chase Parsons MRN: [id number] DOB: [birth year] Today's Date: 08/27/2020   History of Present Illness  Pt adm 08/26/20 with dizziness and falls. Pt found to be hypotensive (65/39) and tachycardic. Pt also with unintentional 30# wt loss since 08/15/20 as well as memory loss. PMH - HTN, cirrhosis, ETOH use disorder.  Clinical Impression  Pt presents to PT with history of frequent falls at home. Pt reports he falls when he is dizzy and has passed out multiple times. Today with me he was able to amb in hallway and did not have any dizzyness/lightheadedness. Pt reports the dizziness is when he is up and it doesn't occur when he is lying down. Documented hypotension along with etoh use is most likely the cause of his frequent falls at home. Until medical issues improve pt will remain at high risk. With frequent falls pt is probably less active at home as well causing some deconditioning. Pt had some outpatient PT las summer after hospitalization and he could benefit from some further OPPT to maximize strength and activity tolerance.     Follow Up Recommendations Outpatient PT    Equipment Recommendations  None recommended by PT    Recommendations for Other Services       Precautions / Restrictions Precautions Precautions: Fall      Mobility  Bed Mobility               General bed mobility comments: Pt up in chair    Transfers Overall transfer level: Needs assistance Equipment used: 4-wheeled walker Transfers: Sit to/from Stand Sit to Stand: Supervision         General transfer comment: Assist for safety.  Ambulation/Gait Ambulation/Gait assistance: Min guard Gait Distance (Feet): 150 Feet Assistive device: 4-wheeled walker Gait Pattern/deviations: Step-through pattern;Decreased stride length;Trunk flexed Gait velocity: decr Gait velocity interpretation: 1.31 - 2.62 ft/sec, indicative of limited community ambulator General  Gait Details: Assist for safety and lines. No loss of balance with rollator. Denies any dizziness with amb  Stairs            Wheelchair Mobility    Modified Rankin (Stroke Patients Only)       Balance Overall balance assessment: Needs assistance Sitting-balance support: No upper extremity supported;Feet supported Sitting balance-Leahy Scale: Good     Standing balance support: Single extremity supported Standing balance-Leahy Scale: Poor Standing balance comment: rollator and supervision for static standing                             Pertinent Vitals/Pain Pain Assessment: No/denies pain    Home Living Family/patient expects to be discharged to:: Private residence Living Arrangements: Other relatives (sister) Available Help at Discharge: Family;Available PRN/intermittently Type of Home: House Home Access: Stairs to enter Entrance Stairs-Rails: None Entrance Stairs-Number of Steps: 4 Home Layout: One level Home Equipment: Walker - 4 wheels;Shower seat      Prior Function Level of Independence: Independent with assistive device(s)               Hand Dominance   Dominant Hand: Right    Extremity/Trunk Assessment   Upper Extremity Assessment Upper Extremity Assessment: Defer to OT evaluation    Lower Extremity Assessment Lower Extremity Assessment: Generalized weakness       Communication   Communication: No difficulties  Cognition Arousal/Alertness: Awake/alert Behavior During Therapy: Flat affect Overall Cognitive Status: History of cognitive impairments - at baseline  General Comments: Pt reports memory loss      General Comments      Exercises     Assessment/Plan    PT Assessment Patient needs continued PT services  PT Problem List Decreased strength;Decreased activity tolerance;Decreased balance;Decreased mobility       PT Treatment Interventions DME instruction;Gait  training;Functional mobility training;Therapeutic activities;Therapeutic exercise;Balance training;Patient/family education;Stair training    PT Goals (Current goals can be found in the Care Plan section)  Acute Rehab PT Goals Patient Stated Goal: not stated PT Goal Formulation: With patient Time For Goal Achievement: 09/10/20 Potential to Achieve Goals: Good    Frequency Min 3X/week   Barriers to discharge        Co-evaluation               AM-PAC PT "6 Clicks" Mobility  Outcome Measure Help needed turning from your back to your side while in a flat bed without using bedrails?: None Help needed moving from lying on your back to sitting on the side of a flat bed without using bedrails?: None Help needed moving to and from a bed to a chair (including a wheelchair)?: A Little Help needed standing up from a chair using your arms (e.g., wheelchair or bedside chair)?: A Little Help needed to walk in hospital room?: A Little Help needed climbing 3-5 steps with a railing? : A Little 6 Click Score: 20    End of Session Equipment Utilized During Treatment: Gait belt Activity Tolerance: Patient limited by fatigue Patient left: in chair;with call bell/phone within reach;with chair alarm set   PT Visit Diagnosis: History of falling (Z91.81);Muscle weakness (generalized) (M62.81);Other abnormalities of gait and mobility (R26.89)    Time: 1240-1250 PT Time Calculation (min) (ACUTE ONLY): 10 min   Charges:   PT Evaluation $PT Eval Moderate Complexity: [place] Pager [phone] Office [place] 08/27/2020, 2:20 PM

## 2020-08-28 LAB — COMPREHENSIVE METABOLIC PANEL
ALT: 62 U/L — ABNORMAL HIGH (ref 0–44)
AST: 48 U/L — ABNORMAL HIGH (ref 15–41)
Albumin: 2.8 g/dL — ABNORMAL LOW (ref 3.5–5.0)
Alkaline Phosphatase: 114 U/L (ref 38–126)
Anion gap: 7 (ref 5–15)
BUN: 9 mg/dL (ref 8–23)
CO2: 26 mmol/L (ref 22–32)
Calcium: 8.4 mg/dL — ABNORMAL LOW (ref 8.9–10.3)
Chloride: 101 mmol/L (ref 98–111)
Creatinine, Ser: 0.84 mg/dL (ref 0.61–1.24)
GFR, Estimated: >60 mL/min (ref >60)
Glucose, Bld: 89 mg/dL (ref 70–99)
Potassium: 3.5 mmol/L (ref 3.5–5.1)
Sodium: 134 mmol/L — ABNORMAL LOW (ref 135–145)
Total Bilirubin: 1.1 mg/dL (ref 0.3–1.2)
Total Protein: 6.1 g/dL — ABNORMAL LOW (ref 6.5–8.1)

## 2020-08-28 LAB — CBC
HCT: 26.2 % — ABNORMAL LOW (ref 39.0–52.0)
Hemoglobin: 8.6 g/dL — ABNORMAL LOW (ref 13.0–17.0)
MCH: 32.3 pg (ref 26.0–34.0)
MCHC: 32.8 g/dL (ref 30.0–36.0)
MCV: 98.5 fL (ref 80.0–100.0)
Platelets: 212 10*3/uL (ref 150–400)
RBC: 2.66 MIL/uL — ABNORMAL LOW (ref 4.22–5.81)
RDW: 12 % (ref 11.5–15.5)
WBC: 7.6 10*3/uL (ref 4.0–10.5)
nRBC: 0 % (ref 0.0–0.2)

## 2020-08-28 LAB — LIPASE, BLOOD: Lipase: 134 U/L — ABNORMAL HIGH (ref 11–51)

## 2020-08-28 LAB — CORTISOL-AM, BLOOD: Cortisol - AM: 16.7 ug/dL (ref 6.7–22.6)

## 2020-08-28 MED ORDER — PANTOPRAZOLE SODIUM 40 MG IV SOLR
40.0000 mg | Freq: Two times a day (BID) | INTRAVENOUS | Status: DC
  Administered 2020-08-28 – 2020-08-29 (×4): 40 mg via INTRAVENOUS
  Filled 2020-08-28 (×4): qty 40
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
## 2020-08-28 NOTE — Progress Notes (Signed)
Physical Therapy Treatment Patient Details Name: Chase Parsons MRN: [id number] DOB: 04-24-1956 Today's Date: 08/28/2020    History of Present Illness Pt adm 08/26/20 with dizziness and falls. Pt found to be hypotensive (65/39) and tachycardic. Pt also with unintentional 30# wt loss since 08/15/20 as well as memory loss. PMH - HTN, cirrhosis, ETOH use disorder.    PT Comments    Pt sitting up in bed watching horror movies, but agreeable to working with therapy. Pt denies dizziness with positional change today. He is supervision for bed mobility and min guard for transfers and ambulation with Rollator. Pt does have c/o back pain with ambulation which subsides when he returns to bed. D/c plans remain appropriate at this time. PT will continue to follow acutely.    Follow Up Recommendations  Outpatient PT     Equipment Recommendations  None recommended by PT       Precautions / Restrictions Precautions Precautions: Fall    Mobility  Bed Mobility Overal bed mobility: Needs Assistance Bed Mobility: Supine to Sit;Sit to Supine     Supine to sit: Supervision Sit to supine: Supervision   General bed mobility comments: supervision for safety with coming to EoB and for return to supine    Transfers Overall transfer level: Needs assistance Equipment used: 4-wheeled walker Transfers: Sit to/from Stand Sit to Stand: Supervision         General transfer comment: supervision for safety, checks brakes prior to power up, good self steadying, no dizziness reported  Ambulation/Gait Ambulation/Gait assistance: Min guard Gait Distance (Feet): 200 Feet Assistive device: 4-wheeled walker Gait Pattern/deviations: Step-through pattern;Decreased stride length;Trunk flexed Gait velocity: decr Gait velocity interpretation: 1.31 - 2.62 ft/sec, indicative of limited community ambulator General Gait Details: min guard for safety, assist for management of lines, decreased foot clearance in  bilateral swing through, but no LoB, denies dizziness         Balance Overall balance assessment: Needs assistance Sitting-balance support: No upper extremity supported;Feet supported Sitting balance-Leahy Scale: Good     Standing balance support: Single extremity supported Standing balance-Leahy Scale: Poor Standing balance comment: rollator and supervision for static standing                            Cognition Arousal/Alertness: Awake/alert Behavior During Therapy: Flat affect Overall Cognitive Status: History of cognitive impairments - at baseline                                 General Comments: Pt reports memory loss         General Comments General comments (skin integrity, edema, etc.): VSS on RA      Pertinent Vitals/Pain Pain Assessment: Faces Pain Score: 9  Faces Pain Scale: Hurts little more Pain Location: back increased with ambulation Pain Descriptors / Indicators: Aching;Sore Pain Intervention(s): Limited activity within patient's tolerance;Monitored during session           PT Goals (current goals can now be found in the care plan section) Acute Rehab PT Goals Patient Stated Goal: not stated PT Goal Formulation: With patient Time For Goal Achievement: 09/10/20 Potential to Achieve Goals: Good Progress towards PT goals: Progressing toward goals    Frequency    Min 3X/week      PT Plan Current plan remains appropriate       AM-PAC PT "6 Clicks" Mobility   Outcome Measure  Help needed turning from your back to your side while in a flat bed without using bedrails?: None Help needed moving from lying on your back to sitting on the side of a flat bed without using bedrails?: None Help needed moving to and from a bed to a chair (including a wheelchair)?: A Little Help needed standing up from a chair using your arms (e.g., wheelchair or bedside chair)?: A Little Help needed to walk in hospital room?: A Little Help  needed climbing 3-5 steps with a railing? : A Little 6 Click Score: 20    End of Session Equipment Utilized During Treatment: Gait belt Activity Tolerance: Patient tolerated treatment well Patient left: with call bell/phone within reach;in bed;with bed alarm set Nurse Communication: Mobility status PT Visit Diagnosis: History of falling (Z91.81);Muscle weakness (generalized) (M62.81);Other abnormalities of gait and mobility (R26.89)     Time: 6546-5035 PT Time Calculation (min) (ACUTE ONLY): 16 min  Charges:  $Therapeutic Exercise: 8-22 mins                     Elnor Renovato B. Migdalia Dk PT, DPT Acute Rehabilitation Services Pager [phone] Office [phone]    [place] 08/28/2020, 4:19 PM

## 2020-08-28 NOTE — Progress Notes (Signed)
Subjective: Patient worked with PT and OT yesterday. Able to ambulate without dizziness or falls yesterday. Today reports feeling weak and only dizzy upon standing. Reports last bowel movement looked black and has had black stools in past, denies grossly bloods stools. Denies nausea and vomiting.   Objective:  Vital signs in last 24 hours: Vitals:   08/27/20 0915 08/27/20 1703 08/27/20 2035 08/28/20 0440  BP: 104/64 129/77 113/69 (!) 143/89  Pulse: 93 99 95 90  Resp: 17 17 17 16   Temp: 98.2 F (36.8 C) 98.2 F (36.8 C) 98.2 F (36.8 C) 98.1 F (36.7 C)  TempSrc:   Oral Oral  SpO2: 100% 100% 98% 96%  Weight:      Height:       Weight change:   Intake/Output Summary (Last 24 hours) at 08/28/2020 0659 Last data filed at 08/27/2020 1858 Gross per 24 hour  Intake 2093.28 ml  Output 850 ml  Net 1243.28 ml   Physical Exam Vitals reviewed.  Constitutional:      Comments: Laying down in bed, appeared tired, speaking softly  HENT:     Head: Atraumatic.  Eyes:     Conjunctiva/sclera: Conjunctivae normal.  Cardiovascular:     Rate and Rhythm: Normal rate and regular rhythm.     Heart sounds: Normal heart sounds.  Pulmonary:     Effort: Pulmonary effort is normal.  Abdominal:     General: Abdomen is flat.  Skin:    General: Skin is warm and dry.  Neurological:     General: No focal deficit present.     Mental Status: Mental status is at baseline.  Psychiatric:        Mood and Affect: Affect is flat.    Assessment/Plan:  Active Problems:   Hypotension due to hypovolemia  Hypotension due to hypovolemia  Dizziness, falls  Patient presents to ED from internal medicine clinic with 1 week of dizziness and falls, found to be hypotensive, tachycardic, and dehydrated. After initial resuscitation, started on maintenance IV fluids and blood pressure improved. Thought to be secondary to daily use of losartan 50 and amlodipine 5 mg.   CBC significant for anemia, which was  intitially thought to be dilutional. However, baseline around 11-14. At admission hemoglobin 11.4 last 2 measurements 7.7 (5/3) and 8.8 (5/4). Given that patient reports melena and has history of GI bleed, will consult for GI for further work up of possible GI bleed.   AM cortisol 16.7, adrenal insufficiency unlikely.   -GI consult for evaluation of possible GI bleed, appreciate recommendations  -Continue to hold losartan and amlodipine -IV LR fluids @ 125 ml/hr  -PT recommended outpatient therapy -OT recommended outpatient therapy and outpatient evaluation for blurry vision   Memory Loss  Patient reports getting lost on his way to grocery store in his neighborhood on several occasions. CT head negative. B12 and folate within normal limits. TSH slightly elevated at 5.830, T4 within normal limits at 0.84.   Elevated lipase  Patient presents with nausea, vomiting, and diarrhea and CT abdomen revealed findings of acute pancreatitis. Lipase mildly elevated to 101. This could be a result of history of heavy alcohol use and/or severe dehydration.    Tolerating PO intake without nausea or vomiting.   -Hold losartan -IV LR fluids @ 125 ml/hr  Weight loss  Patient has had 30 lb weight loss since 07/2020. On ED labs on 05/13/2020 patient had elevated total protein, normal albumin, and mild anemia. Seen in internal medicine clinic  05/21/2020 and proposed workup for multiple myeloma. On labs upon ED presentation, continued to have elevated total protein at 8.6, albumin 4.0, hgb 11.4.   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 Liver enzymes have improved with fluid resuscitation and aPTT (25) and INR (1.2) within normal limits.   -AST (48) and ALT (68) continue to trend downward  -IV LR fluids @ 125 ml/hr   Lactic acidosis (resolved) Lactate down to 1.2 from to 6.9 and  normal anion gap of 8.   AKI (resolved)  Creatinine down to 0.84 from 1.93. Improved with IV fluids and continue to hold BP meds.   Hypertension   Holding home medications due to severe hypotension.   LOS: 2 days   Jacqlyn Larsen, Medical Student 08/28/2020, 6:59 AM  Pager: [phone] After 5pm on weekdays and 1pm on weekends: On Call pager: [phone]

## 2020-08-28 NOTE — TOC Initial Note (Addendum)
Transition of Care [facility]) - Initial/Assessment Note    Patient Details  Name: Chase Parsons MRN: [id number] Date of Birth: 23-Jun-1955  [facility]) CM/SW Contact:    Curlene Labrum, RN Phone Number: 08/28/2020, 11:53 AM  Clinical Narrative:                 Case management met with the patient at the bedside to discuss transitions of care to home - most likely discharge to home on 08/29/2020.  The patient states that he lives with his brother at the home and he will be able to transport him home by car.  Bedside nursing to call the brother for transport to home once the patient is medically clear for discharge.  The patient is having a procedure today and will most likely go home tomorrow per nursing.  I spoke with the patient and the patient was set up with outpatient Pt/OT at the [facility] clinic on [address].  The brother is going to provide transportation.  I spoke with the patient regarding his alcohol abuse and referral instructions for outpatient support for substance abuse was placed in the discharge instructions.  CM will continue to follow the patient for discharge plans to home tomorrow.  08/28/2020 1203  [employer] called and 3:1 ordered to be delivered to the bedside.  Expected Discharge Plan: OP Rehab Barriers to Discharge: Continued Medical Work up   Patient Goals and CMS Choice Patient states their goals for this hospitalization and ongoing recovery are:: Patient plans to discharge home with family once tests at hospital complete. CMS Medicare.gov Compare Post Acute Care list provided to:: Patient Choice offered to / list presented to : Patient  Expected Discharge Plan and Services Expected Discharge Plan: OP Rehab In-house Referral: PCP / [facility] Discharge Planning Services: CM Consult Post Acute Care Choice:  (Outpatient PT/OT at [facility].) Living arrangements for the past 2 months: [place]                                       Prior Living Arrangements/Services Living arrangements for the past 2 months: Single Family Home Lives with:: Relatives Patient language and need for interpreter reviewed:: Yes Do you feel safe going back to the place where you live?: Yes      Need for Family Participation in Patient Care: Yes (Comment) Care giver support system in place?: Yes (comment)   Criminal Activity/Legal Involvement Pertinent to Current Situation/Hospitalization: No - Comment as needed  Activities of Daily Living Home Assistive Devices/Equipment: Walker (specify type) ADL Screening (condition at time of admission) Patient's cognitive ability adequate to safely complete daily activities?: Yes Is the patient deaf or have difficulty hearing?: No Does the patient have difficulty seeing, even when wearing glasses/contacts?: No Does the patient have difficulty concentrating, remembering, or making decisions?: No Patient able to express need for assistance with ADLs?: Yes Does the patient have difficulty dressing or bathing?: No Independently performs ADLs?: Yes (appropriate for developmental age) Does the patient have difficulty walking or climbing stairs?: Yes Weakness of Legs: Both Weakness of Arms/Hands: Both  Permission Sought/Granted Permission sought to share information with : Case Manager Permission granted to share information with : Yes, Verbal Permission Granted     Permission granted to share info w AGENCY: Outpatient Pt/OT set up  Permission granted to share info w Relationship: brother - Dario Guardian -  [phone] for transportation     Emotional Assessment Appearance:: Appears stated age Attitude/Demeanor/Rapport: Gracious Affect (typically observed): Accepting Orientation: : Oriented to Self,Oriented to Place,Oriented to  Time,Oriented to Situation Alcohol / Substance Use: Alcohol Use Psych Involvement: No (comment)  Admission diagnosis:  Dehydration  [E86.0] Transaminitis [R74.01] AKI (acute kidney injury) ([place]) [N17.9] Nausea vomiting and diarrhea [R11.2, R19.7] Hypotension, unspecified hypotension type [I95.9] Hypotension due to hypovolemia [I95.89, E86.1] Patient Active Problem List   Diagnosis Date Noted  . Hypotension due to hypovolemia 08/26/2020  . Elevated total protein 05/21/2020  . Alcoholic cirrhosis ([place]) 75/42/3702  . Severe symptomatic hypertensive urgenrcy  02/29/2020  . Diarrhea 12/06/2019  . Insomnia 12/06/2019  . Healthcare maintenance 12/06/2019  . GERD (gastroesophageal reflux disease) 12/06/2019  . Anemia of chronic disease 12/06/2019  . Physical deconditioning 10/24/2019  . Alcohol use disorder, mild, in controlled environment 10/24/2019  . MDD (major depressive disorder), single episode, severe , no psychosis ([place]) 01/26/2015   PCP:  Andrew Au, MD Pharmacy:   [facility] ([place]), [place] - [address] [place] ([place]) [place] [address] Phone: [phone] Fax: [phone]     Social Determinants of Health (SDOH) Interventions    Readmission Risk Interventions No flowsheet data found.

## 2020-08-29 LAB — CBC
HCT: 22.4 % — ABNORMAL LOW (ref 39.0–52.0)
Hemoglobin: 7.5 g/dL — ABNORMAL LOW (ref 13.0–17.0)
MCH: 32.8 pg (ref 26.0–34.0)
MCHC: 33.5 g/dL (ref 30.0–36.0)
MCV: 97.8 fL (ref 80.0–100.0)
Platelets: 201 10*3/uL (ref 150–400)
RBC: 2.29 MIL/uL — ABNORMAL LOW (ref 4.22–5.81)
RDW: 11.9 % (ref 11.5–15.5)
WBC: 7.3 10*3/uL (ref 4.0–10.5)
nRBC: 0 % (ref 0.0–0.2)

## 2020-08-29 LAB — COMPREHENSIVE METABOLIC PANEL
ALT: 54 U/L — ABNORMAL HIGH (ref 0–44)
AST: 36 U/L (ref 15–41)
Albumin: 2.9 g/dL — ABNORMAL LOW (ref 3.5–5.0)
Alkaline Phosphatase: 103 U/L (ref 38–126)
Anion gap: 10 (ref 5–15)
BUN: 7 mg/dL — ABNORMAL LOW (ref 8–23)
CO2: 29 mmol/L (ref 22–32)
Calcium: 8.7 mg/dL — ABNORMAL LOW (ref 8.9–10.3)
Chloride: 97 mmol/L — ABNORMAL LOW (ref 98–111)
Creatinine, Ser: 0.81 mg/dL (ref 0.61–1.24)
GFR, Estimated: >60 mL/min (ref >60)
Glucose, Bld: 84 mg/dL (ref 70–99)
Potassium: 3.3 mmol/L — ABNORMAL LOW (ref 3.5–5.1)
Sodium: 136 mmol/L (ref 135–145)
Total Bilirubin: 0.8 mg/dL (ref 0.3–1.2)
Total Protein: 6.3 g/dL — ABNORMAL LOW (ref 6.5–8.1)

## 2020-08-29 LAB — C DIFFICILE QUICK SCREEN W PCR REFLEX
C Diff antigen: NEGATIVE
C Diff interpretation: NOT DETECTED
C Diff toxin: NEGATIVE

## 2020-08-29 MED ORDER — POTASSIUM CHLORIDE CRYS ER 10 MEQ PO TBCR
30.0000 meq | EXTENDED_RELEASE_TABLET | Freq: Once | ORAL | Status: AC
  Administered 2020-08-29: 30 meq via ORAL
  Filled 2020-08-29: qty 3

## 2020-08-29 MED ORDER — LACTATED RINGERS IV SOLN: INTRAVENOUS | Status: DC

## 2020-08-29 NOTE — Care Management Important Message (Signed)
Important Message  Patient Details  Name: Chase Parsons MRN: [id number] Date of Birth: May 26, 1955   Medicare Important Message Given:  Yes - Important Message mailed due to current National Emergency   Verbal consent obtained due to current National Emergency  Relationship to patient: Self Contact Name: Yousef Call Date: 08/29/20  Time: 1206 Phone: [phone] Outcome: Spoke with contact Important Message mailed to: Patient address on file    Delorse Lek 08/29/2020, 12:06 PM

## 2020-08-29 NOTE — Progress Notes (Signed)
[facility] Gastroenterology Progress Note  Chase Parsons 65 y.o. [birth year]  CC:   GI bleed   Subjective: Patient seen and examined at bedside.  Complaining of some dizziness when ambulating.   Had a bowel movement this morning and denied seeing any blood in the stool.  It was brown color.  Complaining of left upper quadrant discomfort which has been present prior to admission.  Denied nausea or vomiting.  ROS : Febrile, positive for dizziness.  Negative for chest pain   Objective: Vital signs in last 24 hours: Vitals:   08/28/20 2025 08/29/20 0513  BP: 135/77 (!) 139/93  Pulse: 84 79  Resp: 17 17  Temp: 98.3 F (36.8 C) 98.2 F (36.8 C)  SpO2: 96% 97%    Physical Exam:  General:  Alert, cooperative, no distress, appears stated age  Head:  Normocephalic, without obvious abnormality, atraumatic  Eyes:  , EOM's intact,   Lungs:   Clear to auscultation bilaterally, respirations unlabored  Heart:  Regular rate and rhythm, S1, S2 normal  Abdomen:   Soft, non-tender, left upper quadrant discomfort on palpation, bowel sounds present.  No peritoneal signs  Extremities: Extremities normal, atraumatic, no  edema  Pulses: 2+ and symmetric    Lab Results: Recent Labs    08/28/20 0308 08/29/20 0445  NA 134* 136  K 3.5 3.3*  CL 101 97*  CO2 26 29  GLUCOSE 89 84  BUN 9 7*  CREATININE 0.84 0.81  CALCIUM 8.4* 8.7*   Recent Labs    08/28/20 0308 08/29/20 0445  AST 48* 36  ALT 62* 54*  ALKPHOS 114 103  BILITOT 1.1 0.8  PROT 6.1* 6.3*  ALBUMIN 2.8* 2.9*   Recent Labs    08/26/20 1020 08/27/20 0202 08/28/20 0800 08/29/20 0445  WBC 9.9   < > 7.6 7.3  NEUTROABS 6.5  --   --   --   HGB 11.4*   < > 8.6* 7.5*  HCT 34.9*   < > 26.2* 22.4*  MCV 101.2*   < > 98.5 97.8  PLT 214   < > 212 201   < > = values in this interval not displayed.   Recent Labs    08/26/20 2054  LABPROT 15.6*  INR 1.2      Assessment/Plan: -Anemia with intermittent melena.  Patient with  history of cirrhosis and portal hypertensive gastropathy.  No active bleeding at this time. BUN is trending down.  Mild drop in hemoglobin noted. -Diarrhea.  Resolved. -Cirrhosis most likely from alcohol use. - ?  pancreatitis.  Could be related to underlying alcohol use.  Recommendations ------------------------- -Increase IV hydration to 200 cc/h -N.p.o. past midnight -Continue IV PPI - EGD tomorrow  Risks (bleeding, infection, bowel perforation that could require surgery, sedation-related changes in cardiopulmonary systems), benefits (identification and possible treatment of source of symptoms, exclusion of certain causes of symptoms), and alternatives (watchful waiting, radiographic imaging studies, empiric medical treatment)  were explained to patient/family in detail and patient wishes to proceed.   Otis Brace MD, [place] 08/29/2020, 9:39 AM  Contact #  [phone]

## 2020-08-29 NOTE — Progress Notes (Signed)
Occupational Therapy Treatment Patient Details Name: Chase Parsons MRN: [id number] DOB: 03/30/1956 Today's Date: 08/29/2020    History of present illness Pt adm 08/26/20 with dizziness and falls. Pt found to be hypotensive (65/39) and tachycardic. Pt also with unintentional 30# wt loss since 08/15/20 as well as memory loss. PMH - HTN, cirrhosis, ETOH use disorder.   OT comments  Patient supine in bed and agreeable to OT session.  Completing transfers using rollator with supervision, but requires cueing for rollator brake mgmt and safety, in room mobility to/from sink with min guard for safety. He denies symptoms of lightheaded in sitting but reports when standing/walking today-- once reaching the sink, pt sitting down and reports symptoms not resolving and requests to return supine.  BP assessed once supine at 121/83, then transitioned to sitting with BP 100/83, and standing (initated standing but unable to sustain due to symptoms--therefore not clear reading) at 136/67.  Educated pt on fall prevention strategies, use of BSC for toileting needs at home as he reports his sister works during the day.  Will continue to follow acutely.     Follow Up Recommendations  Outpatient OT;Other (comment) (eye dr for blurry vision)    Equipment Recommendations  3 in 1 bedside commode    Recommendations for Other Services      Precautions / Restrictions Precautions Precautions: Fall Restrictions Weight Bearing Restrictions: No       Mobility Bed Mobility Overal bed mobility: Needs Assistance Bed Mobility: Supine to Sit;Sit to Supine     Supine to sit: Supervision Sit to supine: Supervision   General bed mobility comments: supervision for safety with coming to EoB and for return to supine    Transfers Overall transfer level: Needs assistance Equipment used: 4-wheeled walker Transfers: Sit to/from Stand Sit to Stand: Supervision         General transfer comment: supervision for safety,  requires cueing for rollator brakes mgmt; reports lightheaded in standing    Balance Overall balance assessment: Needs assistance Sitting-balance support: No upper extremity supported;Feet supported Sitting balance-Leahy Scale: Good     Standing balance support: No upper extremity supported;During functional activity;Bilateral upper extremity supported Standing balance-Leahy Scale: Poor Standing balance comment: rollator and supervision for static standing, min guard dynamically                           ADL either performed or assessed with clinical judgement   ADL Overall ADL's : Needs assistance/impaired     Grooming: Set up;Sitting       Lower Body Bathing: Supervison/ safety;Sit to/from stand Lower Body Bathing Details (indicate cue type and reason): reviewed safety, figure 4 techniques sitting due to dizziness with standing     Lower Body Dressing: Supervision/safety;Sit to/from stand Lower Body Dressing Details (indicate cue type and reason): reviewed figure 4 technique for safety, completing socks with supervision sit to stand with supervision Toilet Transfer: Min guard;Ambulation (rollator) Toilet Transfer Details (indicate cue type and reason): cueing for safety         Functional mobility during ADLs: Min guard;Cueing for safety;Cueing for sequencing (rollator) General ADL Comments: pt limited by dizziness when standing and ambulating today.  Patient required cueing to recall rollator brake mgmt during once reaching the sink; requested to return to bed due to lightheadedness.  BP monitored, supine after activity 121/83, seated EOB 100/83 and standing (but completed in standing) 136/67     Vision       Perception  Praxis      Cognition Arousal/Alertness: Awake/alert Behavior During Therapy: Flat affect Overall Cognitive Status: History of cognitive impairments - at baseline                                 General Comments: pt  with memory loss, decreased safety awareness        Exercises     Shoulder Instructions       General Comments Vitals assessed, see note for details.  Pt reports lightheadedness today with standing and mobility in room. Reviewed fall prevention techniques but pt with poor recall    Pertinent Vitals/ Pain       Pain Assessment: Faces Faces Pain Scale: No hurt Pain Intervention(s): Monitored during session  Home Living                                          Prior Functioning/Environment              Frequency  Min 2X/week        Progress Toward Goals  OT Goals(current goals can now be found in the care plan section)  Progress towards OT goals: Progressing toward goals  Acute Rehab OT Goals Patient Stated Goal: to feel better OT Goal Formulation: With patient  Plan Discharge plan remains appropriate;Frequency remains appropriate    Co-evaluation                 AM-PAC OT "6 Clicks" Daily Activity     Outcome Measure   Help from another person eating meals?: None Help from another person taking care of personal grooming?: A Little Help from another person toileting, which includes using toliet, bedpan, or urinal?: A Little Help from another person bathing (including washing, rinsing, drying)?: A Little Help from another person to put on and taking off regular upper body clothing?: A Little Help from another person to put on and taking off regular lower body clothing?: A Little 6 Click Score: 19    End of Session Equipment Utilized During Treatment: Gait belt;Other (comment) (rollator)  OT Visit Diagnosis: Unsteadiness on feet (R26.81);Muscle weakness (generalized) (M62.81);Other symptoms and signs involving cognitive function   Activity Tolerance Other (comment) (limited by lightheadedness in standing)   Patient Left in bed;with call bell/phone within reach;with bed alarm set   Nurse Communication Mobility status;Other (comment)  (BP)        Time: 0947-0962 OT Time Calculation (min): 24 min  Charges: OT General Charges $OT Visit: 1 Visit OT Treatments $Self Care/Home Management : 23-37 mins  Jolaine Artist, OT Acute Rehabilitation Services Pager [phone] Office [phone]    Delight Stare 08/29/2020, 10:46 AM

## 2020-08-29 NOTE — Progress Notes (Signed)
Subjective: Continues to feel dizzy when transitioning from sitting to standing. Denies feeling dizzy when sitting or laying down. Reports history of blood in stools and currently feels abdominal pain. No nausea, vomiting, or diarrhea.     Objective:  Vital signs in last 24 hours: Vitals:   08/28/20 0934 08/28/20 1852 08/28/20 2025 08/29/20 0513  BP: 127/79 138/83 135/77 (!) 139/93  Pulse: 91 95 84 79  Resp: _0 Temp: 98.5 F (36.9 C) 98.3 F (36.8 C) 98.3 F (36.8 C) 98.2 F (36.8 C)  TempSrc: Oral Oral Oral Oral  SpO2: 97% 96% 96% 97%  Weight:      Height:       Weight change:   Intake/Output Summary (Last 24 hours) at 08/29/2020 0727 Last data filed at 08/29/2020 0600 Gross per 24 hour  Intake 1980 ml  Output 875 ml  Net 1105 ml   Physical Exam Vitals reviewed.  Constitutional:      Comments: Sitting up in bed, watching TV, no acute distress  HENT:     Head: Atraumatic.     Mouth/Throat:     Mouth: Mucous membranes are moist.     Pharynx: Oropharynx is clear.  Eyes:     Conjunctiva/sclera: Conjunctivae normal.  Cardiovascular:     Rate and Rhythm: Normal rate and regular rhythm.     Heart sounds: Normal heart sounds.  Pulmonary:     Effort: Pulmonary effort is normal.  Abdominal:     General: Abdomen is flat. Bowel sounds are normal.     Palpations: Abdomen is soft.     Comments: Tenderness to palpation in left lower quadrant  Skin:    General: Skin is warm and dry.  Neurological:     General: No focal deficit present.     Mental Status: Mental status is at baseline.  Psychiatric:        Mood and Affect: Affect is flat.    Assessment/Plan:  Active Problems:   Hypotension due to hypovolemia  Hypotension due to hypovolemia  Dizziness, falls  Patient presents to ED from internal medicine clinic with 1 week of dizziness and falls, found to be hypotensive, tachycardic, and dehydrated. After initial resuscitation, started on maintenance IV  fluids and blood pressure improved. Thought to be secondary to daily use of losartan 50 and amlodipine 5 mg. AM cortisol 16.7, adrenal insufficiency unlikely.   CBC significant for anemia, which was intitially thought to be dilutional. However, baseline around 11-14.Previous 3 hemoglobin measurements 7.7 (5/3), 8.8 (5/4), 7.5 (5/5). Given history of melena and bloody stools, concern for possible GI bleed. GI consulted and EGD scheduled for 5/6.   -Follow up orthostatic vitals signs  -EGD scheduled tomorrow (5/6)  -Continue to hold losartan and amlodipine -IV LR fluids @ 100 ml/hr, consider discontinuing fluids given patient looks well perfused -GI following, appreciate recommendations  -PT recommended outpatient therapy -OT recommended outpatient therapy and outpatient evaluation for blurry vision   Memory Loss  Patient reports getting lost on his way to grocery store in his neighborhood on several occasions. CT head negative. B12 and folate within normal limits. TSH slightly elevated at 5.830, T4 within normal limits at 0.84.   Elevated lipase  Patient presents with nausea, vomiting, and diarrhea and CT abdomen revealed findings of acute pancreatitis. Lipase mildly elevated to 101. This could be a result of history of heavy alcohol use and/or severe dehydration.    Tolerating PO intake without nausea or vomiting.   -  Hold losartan -IV LR fluids @ 125 ml/hr  Weight loss  Patient has had 30 lb weight loss since 07/2020. On ED labs on 05/13/2020 patient had elevated total protein, normal albumin, and mild anemia. Seen in internal medicine clinic 05/21/2020 and proposed workup for multiple myeloma. On labs upon ED presentation, continued to have elevated total protein at 8.6, albumin 4.0, hgb 11.4.   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 Liver enzymes have improved  with fluid resuscitation and aPTT (25) and INR (1.2) within normal limits.   -AST (36) and ALT (54) continue to trend downward   Lactic acidosis (resolved) Lactate down to 1.2 from to 6.9 and normal anion gap of 8.   AKI (resolved)  Creatinine down to 0.81 from 1.93. Improved with IV fluids and continue to hold BP meds.   Hypertension   Holding home medications due to severe hypotension.   LOS: 3 days   Jacqlyn Larsen, Medical Student 08/29/2020, 7:27 AM  Pager: [phone] After 5pm on weekdays and 1pm on weekends: On Call pager: [phone]

## 2020-08-30 ENCOUNTER — Encounter (HOSPITAL_COMMUNITY)
Admission: EM | Disposition: A | Discharge status: Home / Self Care | Payer: Self-pay | Source: Ambulatory Visit | Attending: Internal Medicine

## 2020-08-30 ENCOUNTER — Inpatient Hospital Stay (HOSPITAL_COMMUNITY): Payer: Medicare Other | Admitting: Certified Registered Nurse Anesthetist

## 2020-08-30 ENCOUNTER — Encounter (HOSPITAL_COMMUNITY): Payer: Self-pay | Admitting: Internal Medicine

## 2020-08-30 HISTORY — PX: ESOPHAGOGASTRODUODENOSCOPY: SHX5428

## 2020-08-30 LAB — COMPREHENSIVE METABOLIC PANEL
ALT: 50 U/L — ABNORMAL HIGH (ref 0–44)
AST: 31 U/L (ref 15–41)
Albumin: 3.1 g/dL — ABNORMAL LOW (ref 3.5–5.0)
Alkaline Phosphatase: 85 U/L (ref 38–126)
Anion gap: 11 (ref 5–15)
BUN: 8 mg/dL (ref 8–23)
CO2: 27 mmol/L (ref 22–32)
Calcium: 8.8 mg/dL — ABNORMAL LOW (ref 8.9–10.3)
Chloride: 98 mmol/L (ref 98–111)
Creatinine, Ser: 0.79 mg/dL (ref 0.61–1.24)
GFR, Estimated: >60 mL/min (ref >60)
Glucose, Bld: 90 mg/dL (ref 70–99)
Potassium: 3.4 mmol/L — ABNORMAL LOW (ref 3.5–5.1)
Sodium: 136 mmol/L (ref 135–145)
Total Bilirubin: 0.7 mg/dL (ref 0.3–1.2)
Total Protein: 6.6 g/dL (ref 6.5–8.1)

## 2020-08-30 LAB — GASTROINTESTINAL PANEL BY PCR, STOOL (REPLACES STOOL CULTURE)

## 2020-08-30 LAB — CBC
HCT: 23.1 % — ABNORMAL LOW (ref 39.0–52.0)
Hemoglobin: 7.7 g/dL — ABNORMAL LOW (ref 13.0–17.0)
MCH: 32.6 pg (ref 26.0–34.0)
MCHC: 33.3 g/dL (ref 30.0–36.0)
MCV: 97.9 fL (ref 80.0–100.0)
Platelets: 214 10*3/uL (ref 150–400)
RBC: 2.36 MIL/uL — ABNORMAL LOW (ref 4.22–5.81)
RDW: 12.3 % (ref 11.5–15.5)
WBC: 7.8 10*3/uL (ref 4.0–10.5)
nRBC: 0.3 % — ABNORMAL HIGH (ref 0.0–0.2)

## 2020-08-30 SURGERY — EGD (ESOPHAGOGASTRODUODENOSCOPY)
Anesthesia: Monitor Anesthesia Care

## 2020-08-30 MED ORDER — LACTATED RINGERS IV SOLN
INTRAVENOUS | Status: DC
  Administered 2020-08-30: 1000 mL via INTRAVENOUS

## 2020-08-30 MED ORDER — CYANOCOBALAMIN 1000 MCG PO TABS: 1000.0000 ug | ORAL_TABLET | Freq: Every day | ORAL | 0 refills | Status: DC

## 2020-08-30 MED ORDER — LIDOCAINE 2% (20 MG/ML) 5 ML SYRINGE
INTRAMUSCULAR | Status: DC | PRN
  Administered 2020-08-30: 20 mg via INTRAVENOUS

## 2020-08-30 MED ORDER — PANTOPRAZOLE SODIUM 40 MG PO TBEC
40.0000 mg | DELAYED_RELEASE_TABLET | Freq: Every day | ORAL | Status: DC
  Administered 2020-08-30: 40 mg via ORAL
  Filled 2020-08-30: qty 1

## 2020-08-30 MED ORDER — LACTATED RINGERS IV SOLN: INTRAVENOUS | Status: DC | PRN

## 2020-08-30 MED ORDER — SODIUM CHLORIDE 0.9 % IV SOLN: INTRAVENOUS | Status: DC

## 2020-08-30 MED ORDER — PROPOFOL 500 MG/50ML IV EMUL
INTRAVENOUS | Status: DC | PRN
  Administered 2020-08-30: 150 ug/kg/min via INTRAVENOUS

## 2020-08-30 MED ORDER — PROPOFOL 10 MG/ML IV BOLUS
INTRAVENOUS | Status: DC | PRN
  Administered 2020-08-30: 20 mg via INTRAVENOUS
  Administered 2020-08-30: 30 mg via INTRAVENOUS

## 2020-08-30 MED ORDER — DEXMEDETOMIDINE (PRECEDEX) IN NS 20 MCG/5ML (4 MCG/ML) IV SYRINGE
PREFILLED_SYRINGE | INTRAVENOUS | Status: DC | PRN
  Administered 2020-08-30: 8 ug via INTRAVENOUS

## 2020-08-30 NOTE — Op Note (Signed)
[facility] Patient Name: Chase Parsons Procedure Date : 08/30/2020 MRN: [id number] Attending MD: Otis Brace , MD Date of Birth: Jun 13, 1955 CSN: [id number] Age: 65 Admit Type: Inpatient Procedure:                Upper GI endoscopy Indications:              Iron deficiency anemia, Melena Providers:                Otis Brace, MD, Baird Cancer, RN, Ladona Ridgel, Technician Referring MD:              Medicines:                Sedation Administered by an Anesthesia Professional Complications:            No immediate complications. Estimated Blood Loss:     Estimated blood loss was minimal. Procedure:                Pre-Anesthesia Assessment:                           - Prior to the procedure, a History and Physical                            was performed, and patient medications and                            allergies were reviewed. The patient's tolerance of                            previous anesthesia was also reviewed. The risks                            and benefits of the procedure and the sedation                            options and risks were discussed with the patient.                            All questions were answered, and informed consent                            was obtained. Prior Anticoagulants: The patient has                            taken no previous anticoagulant or antiplatelet                            agents. ASA Grade Assessment: III - A patient with                            severe systemic disease. After reviewing the risks  and benefits, the patient was deemed in                            satisfactory condition to undergo the procedure.                           After obtaining informed consent, the endoscope was                            passed under direct vision. Throughout the                            procedure, the patient's blood pressure, pulse, and                             oxygen saturations were monitored continuously. The                            GIF-H190 ([id number]) Olympus gastroscope was                            introduced through the mouth, and advanced to the                            second part of duodenum. The upper GI endoscopy was                            accomplished without difficulty. The patient                            tolerated the procedure well. Scope In: Scope Out: Findings:      The Z-line was regular and was found 39 cm from the incisors.      There is no endoscopic evidence of esophagitis or varices in the entire       esophagus.      Scattered moderate inflammation characterized by congestion (edema),       erosions and erythema was found in the gastric body.      The cardia and gastric fundus were normal on retroflexion.      The duodenal bulb, first portion of the duodenum and second portion of       the duodenum were normal. Impression:               - Z-line regular, 39 cm from the incisors.                           - Gastritis.                           - Normal duodenal bulb, first portion of the                            duodenum and second portion of the duodenum.                           -  No specimens collected. Recommendation:           - Return patient to hospital ward for ongoing care.                           - Low sodium diet.                           - Continue present medications.                           - Return to my office in 6 weeks. Procedure Code(s):        --- Professional ---                           [phone], Esophagogastroduodenoscopy, flexible,                            transoral; diagnostic, including collection of                            specimen(s) by brushing or washing, when performed                            (separate procedure) Diagnosis Code(s):        --- Professional ---                           K29.70, Gastritis, unspecified, without bleeding                            D50.9, Iron deficiency anemia, unspecified                           K92.1, Melena (includes Hematochezia) CPT copyright 2019 [employer] Association. All rights reserved. The codes documented in this report are preliminary and upon coder review may  be revised to meet current compliance requirements. Otis Brace, MD Otis Brace, MD 08/30/2020 8:43:12 AM This report has been signed electronically. Number of Addenda: 0

## 2020-08-30 NOTE — Anesthesia Procedure Notes (Signed)
Procedure Name: MAC Date/Time: 08/30/2020 8:34 AM Performed by: Dorthea Cove, CRNA Pre-anesthesia Checklist: Patient identified, Emergency Drugs available, Suction available, Patient being monitored and Timeout performed Patient Re-evaluated:Patient Re-evaluated prior to induction Oxygen Delivery Method: Nasal cannula Preoxygenation: Pre-oxygenation with 100% oxygen Induction Type: IV induction Placement Confirmation: CO2 detector Dental Injury: Teeth and Oropharynx as per pre-operative assessment

## 2020-08-30 NOTE — Discharge Summary (Addendum)
Name: Chase Parsons MRN: [id number] DOB: 1955/09/12 65 y.o. PCP: Andrew Au, MD  Date of Admission: 08/26/2020  9:57 AM Date of Discharge:   08/30/2020 Attending Physician: Lucious Groves, DO  Discharge Diagnosis: 1. Hypotension 2/2 hypovolemia  2. Gastritis 2/2 alcohol use disorder   Discharge Medications: Allergies as of 08/30/2020   No Known Allergies      Medication List     STOP taking these medications    amLODipine 5 MG tablet Commonly known as: NORVASC   losartan 50 MG tablet Commonly known as: Cozaar   ondansetron 4 MG disintegrating tablet Commonly known as: ZOFRAN-ODT       TAKE these medications    cyanocobalamin 1000 MCG tablet Take 1 tablet (1,000 mcg total) by mouth daily.   FLUoxetine 10 MG capsule Commonly known as: PROZAC Take 1 capsule by mouth once daily   folic acid 1 MG tablet Commonly known as: FOLVITE Take 1 tablet by mouth once daily   pantoprazole 40 MG tablet Commonly known as: PROTONIX Take 1 tablet (40 mg total) by mouth daily.   thiamine 100 MG tablet Commonly known as: Vitamin B-1 Take 100 mg by mouth daily.               Durable Medical Equipment  (From admission, onward)           Start     Ordered   08/30/20 1102  DME 3-in-1  Once        08/30/20 1101   08/28/20 1203  For home use only DME 3 n 1  Once        08/28/20 1202            Disposition and follow-up:   [name] is a 65 yo male with PMH of AUD, cirrhosis, and HTN who presented for hypotension, dizziness, and falls. He was discharged from [facility] in Stable condition. At the hospital follow up visit please address:  To do at hospital follow up:    [ ]  Repeat CBC. Hemoglobin 7.8 on day of discharge, likely dilutional from maintenance IV fluids [ ]  EGD negative for bleed, consider for colonoscopy if anemia does not resolve (previous done in 01/2020)  [ ]  Consider optometry or ophthalmology referral for  chronic blurry vision  [ ]  Consider multiple myeloma work up for 30 lb weight loss.  [ ]  RPR pending to evaluate for tertiary neurosyphilis given dizziness, falls, and memory loss, low suspicion   Follow-up Appointments:  Follow-up Information     Andrew Au, MD. Schedule an appointment as soon as possible for a visit.   Specialty: Internal Medicine Why: Please follow up with the internal medicine clinic for hospital follow up in the next 7-10 days. Contact information: [place] [address] [phone]         [facility] Follow up.   Specialty: Rehabilitation Why: The outpatient clinic with call you and schedule outpatient physical therapy and occupational therapy. Contact information: [address] [id number] mc [place], [place] [facility] Follow up.   Why: [employer] to provide a 3:1 to your hospital room before you are discharged home. Contact information: [address] [place] [address] [phone]                 Hospital Course by problem list:  Hypotension due to hypovolemia  Dizziness,  falls  Patient presents to ED from internal medicine clinic with 1 week of dizziness and falls, found to be hypotensive, tachycardic, and dehydrated. Orthostatics positive. Likely multifactorial from daily use of losartan 50 and amlodipine 5 mg leading to hypotensive episodes and chronic alcohol use. BP meds held. Placed on maintenance IV LR fluids and blood pressure improved significantly. Hemoglobin trended downwards from baseline of 11.4 to ~7-8 during admission, initially thought to be dilutional, but patient reported history of melena and has history of GI bleed. GI consult called and scheduled EGD. EGD negative for active or past bleed. Positive for gastritis, otherwise normal. Patient worked with PT and OT during admission, both recommend  outpatient therapy.    Memory Loss  Patient reports getting lost on his way to grocery store in his neighborhood on several occasions and on initial interview interview  was not oriented to time or situation. Low concern for hepatic encephalopathy. Given that patient had several falls, checked head CT which was unremarkable, showed stable L middle cranial fossa arachnoid cyst which has been present since 2009. TSH 5.830, not significantly elevated to cause memory changes. Free T4 within normal limits at 0.84. Vitamin B12 and folate within normal limits.   Elevated lipase  Patient reported nausea, vomiting, and diarrhea for the past week and CT abdomen revealed findings of acute pancreatitis. Lipase mildly elevated to 101 and on repeat elevated to 134. Most likely result of history of heavy alcohol use and/or severe dehydration. Patient tolerated PO intake on day 2 of admission without nausea or vomiting.   Lactic acidosis (resolved) Lactate elevated to 6.9 and anion gap of 19 upon presentation. Lactate trended down to 1.2 and anion gap normalized at 8 after maintenance IV fluids.    Most likely type a lactic acidosis 2/2 to decreased systemic blood pressure. History of chronic alcohol use and hepatic dysfunction also likely contributing to elevated lactate in setting of hypotension.  AKI (resolved) Creatinine of 1.93 down to 0.79 on day of discharge. Most likely due to daily doses of losartan and dehydration.   Weight loss  Patient has had 30 lb weight loss since 07/2020. On ED labs on 05/13/2020 patient had elevated total protein, normal albumin, and mild anemia. Seen in internal medicine clinic 05/21/2020 and proposed workup for multiple myeloma. On labs upon ED presentation today, continues to have elevated total protein at 8.6, albumin 4.0, hgb 11.4. Consider multiple myeloma work up with SPEP and free light chains as an outpatient.    Transaminitis (resolved) Cirrhosis 2/2 Alcohol Use Disorder   Has a history of heavy alcohol use (2 40 oz beers/day) and found to have transaminitis, although had not consumed alcohol in 1 week prior to admission. CIWA score below 8 did not indicate need for ativan. Transaminitis is likely due to hypotension and dehydration and improved with fluid resuscitation. aPTT and INR within normal limits. On day of discharge AST 31 and ALT 50.    Hypertension   Holding home medications due to severe hypotension on discharge.   Discharge Exam:   BP (!) 153/89 (BP Location: Right Arm)   Pulse 62   Temp 98.1 F (36.7 C) (Oral)   Resp 16   Ht 6' (1.829 m)   Wt 63.5 kg   SpO2 100%   BMI 18.99 kg/m   Physical Exam Constitutional:      Appearance: Normal appearance.  HENT:     Head: Normocephalic.     Mouth/Throat:     Mouth: Mucous  membranes are moist.     Pharynx: Oropharynx is clear.  Eyes:     Extraocular Movements: Extraocular movements intact.     Conjunctiva/sclera: Conjunctivae normal.  Cardiovascular:     Rate and Rhythm: Normal rate and regular rhythm.     Heart sounds: Normal heart sounds.  Pulmonary:     Effort: Pulmonary effort is normal.  Abdominal:     General: Abdomen is flat. Bowel sounds are normal.  Skin:    General: Skin is warm and dry.  Neurological:     General: No focal deficit present.     Mental Status: Mental status is at baseline.  Psychiatric:        Mood and Affect: Mood normal.     Comments: Affect flat, speaking softly    Pertinent Labs, Studies, and Procedures:  CBC Latest Ref Rng & Units 08/30/2020 08/29/2020 08/28/2020  WBC 4.0 - 10.5 K/uL 7.8 7.3 7.6  Hemoglobin 13.0 - 17.0 g/dL 7.7(L) 7.5(L) 8.6(L)  Hematocrit 39.0 - 52.0 % 23.1(L) 22.4(L) 26.2(L)  Platelets 150 - 400 K/uL 214 201 212   CMP Latest Ref Rng & Units 08/30/2020 08/29/2020 08/28/2020  Glucose 70 - 99 mg/dL 90 84 89  BUN 8 - 23 mg/dL 8 7(L) 9  Creatinine 0.61 - 1.24 mg/dL 0.79 0.81 0.84  Sodium 135 - 145 mmol/L 136 136 134(L)  Potassium 3.5 - 5.1  mmol/L 3.4(L) 3.3(L) 3.5  Chloride 98 - 111 mmol/L 98 97(L) 101  CO2 22 - 32 mmol/L 27 29 26   Calcium 8.9 - 10.3 mg/dL 8.8(L) 8.7(L) 8.4(L)  Total Protein 6.5 - 8.1 g/dL 6.6 6.3(L) 6.1(L)  Total Bilirubin 0.3 - 1.2 mg/dL 0.7 0.8 1.1  Alkaline Phos 38 - 126 U/L 85 103 114  AST 15 - 41 U/L 31 36 48(H)  ALT 0 - 44 U/L 50(H) 54(H) 62(H)   Lipase     Component Value Date/Time   LIPASE 134 (H) 08/28/2020 1417   EXAM: CT ABDOMEN AND PELVIS WITHOUT CONTRAST   TECHNIQUE: Multidetector CT imaging of the abdomen and pelvis was performed following the standard protocol without IV contrast.   COMPARISON:  02/22/2020 CT abdomen/pelvis. 05/13/2020 CT angiogram of the chest, abdomen and pelvis.   FINDINGS: Lower chest: No significant pulmonary nodules or acute consolidative airspace disease.   Hepatobiliary: Diffuse hepatic steatosis. Finely irregular liver surface, suggesting cirrhosis. No liver masses on this noncontrast study. Normal gallbladder with no radiopaque cholelithiasis. No biliary ductal dilatation.   Pancreas: There is mild haziness of the peripancreatic fat at the pancreatic head suggesting acute pancreatitis. No discrete pancreatic mass or duct dilation.   Spleen: Normal size. No mass.   Adrenals/Urinary Tract: Normal adrenals. At least 5 nonobstructing stones scattered in the right kidney, largest 3 mm in the interpolar right kidney. Nonobstructing 4 mm interpolar left renal stone. No hydronephrosis. No contour deforming renal masses. Normal caliber ureters. No ureteral stones. Normal bladder with no bladder stones.   Stomach/Bowel: Normal non-distended stomach. Normal caliber small bowel with no small bowel wall thickening. Normal appendix. Mild diffuse colonic diverticulosis with no large bowel wall thickening or significant pericolonic fat stranding.   Vascular/Lymphatic: Atherosclerotic nonaneurysmal abdominal aorta. No pathologically enlarged lymph nodes in  the abdomen or pelvis.   Reproductive: Top-normal size prostate.   Other: No pneumoperitoneum, ascites or focal fluid collection.   Musculoskeletal: No aggressive appearing focal osseous lesions.   IMPRESSION: 1. Mild haziness of the peripancreatic fat at the pancreatic head, suggesting acute pancreatitis. No  discrete pancreatic mass or duct dilation. No biliary ductal dilatation. No radiopaque cholelithiasis. Suggest correlation with serum lipase level. 2. Nonobstructing bilateral nephrolithiasis. No hydronephrosis. 3. Diffuse hepatic steatosis. Finely irregular liver surface, suggesting cirrhosis. No liver masses on this noncontrast study. 4. Mild diffuse colonic diverticulosis. 5. Aortic Atherosclerosis (ICD10-I70.0). Electronically Signed   By: Ilona Sorrel M.D.   On: 08/26/2020 13:50  EXAM: CT HEAD WITHOUT CONTRAST   TECHNIQUE: Contiguous axial images were obtained from the base of the skull through the vertex without intravenous contrast.   COMPARISON:  None.   FINDINGS: Brain: There is no mass, hemorrhage or extra-axial collection. The size and configuration of the ventricles and extra-axial CSF spaces are normal. There is hypoattenuation of the white matter, most commonly indicating chronic small vessel disease. Left middle cranial fossa arachnoid cyst   Vascular: No abnormal hyperdensity of the major intracranial arteries or dural venous sinuses. No intracranial atherosclerosis.   Skull: The visualized skull base, calvarium and extracranial soft tissues are normal.   Sinuses/Orbits: No fluid levels or advanced mucosal thickening of the visualized paranasal sinuses. No mastoid or middle ear effusion. The orbits are normal.   IMPRESSION: 1. No acute intracranial abnormality. 2. Left middle cranial fossa arachnoid cyst. Electronically Signed   By: Ulyses Jarred M.D.   On: 08/26/2020 21:00   Discharge Instructions: Discharge Instructions     Ambulatory  referral to Physical Therapy   Complete by: As directed    Iontophoresis - 4 mg/ml of dexamethasone: No   T.E.N.S. Unit Evaluation and Dispense as Indicated: No   Call MD for:  difficulty breathing, headache or visual disturbances   Complete by: As directed    Call MD for:  extreme fatigue   Complete by: As directed    Call MD for:  persistant dizziness or light-headedness   Complete by: As directed    Call MD for:  persistant nausea and vomiting   Complete by: As directed    Diet - low sodium heart healthy   Complete by: As directed    Increase activity slowly   Complete by: As directed       Signed: Jacqlyn Larsen, Medical Student 08/30/2020, 11:03 AM    Attestation for Student Documentation:  I personally was present and performed or re-performed the history, physical exam and medical decision-making activities of this service and have verified that the service and findings are accurately documented in the student's note, with the addition that Mr. Blakeman's current blood pressure regimen was held during his discharge and his vitals should be monitored in the outpatient setting.   Maudie Mercury, MD 08/30/2020, 11:18 AM

## 2020-08-30 NOTE — Progress Notes (Signed)
Physical Therapy Treatment Patient Details Name: Chase Parsons MRN: [id number] DOB: 11/11/1955 Today's Date: 08/30/2020    History of Present Illness Pt adm 08/26/20 with dizziness and falls. Pt found to be hypotensive (65/39) and tachycardic. Pt also with unintentional 30# wt loss since 08/15/20 as well as memory loss. PMH - HTN, cirrhosis, ETOH use disorder.    PT Comments    Pt agreeable to ambulation while waiting to be discharged. With coming to EoB pt reports "I don't feel good". Pt evades probing questions and gets up and walks out door with Rollator. Pt continues to ambulate with increasingly flexed posture, eventually turning and sitting on Rollator without engaging brakes. Pt reports again "I don't feel well." PT tried to ask questions about how he does not feel well and pt finally states "I'm dizzy." Pt does not answer any questions about aggravating circumstance, and about postural change. Pt states "I just want to go home." Pt refuses to answer further questions. NT present with w/c for transport to exit. Pt transfers to w/c and leaves.      Follow Up Recommendations  Outpatient PT     Equipment Recommendations  None recommended by PT       Precautions / Restrictions Precautions Precautions: Fall Restrictions Weight Bearing Restrictions: No    Mobility  Bed Mobility Overal bed mobility: Needs Assistance Bed Mobility: Supine to Sit;Sit to Supine     Supine to sit: Supervision Sit to supine: Supervision   General bed mobility comments: supervision for safety with coming to EoB and for return to supine    Transfers Overall transfer level: Needs assistance Equipment used: 4-wheeled walker Transfers: Sit to/from Stand Sit to Stand: Supervision         General transfer comment: supervision for safety, checks brakes prior to power up, good self steadying, no dizziness reported  Ambulation/Gait Ambulation/Gait assistance: Min guard Gait Distance (Feet): 50  Feet Assistive device: 4-wheeled walker Gait Pattern/deviations: Step-through pattern;Decreased stride length;Trunk flexed Gait velocity: decr Gait velocity interpretation: <1.31 ft/sec, indicative of household ambulator General Gait Details: min guard for safety, slow, shuffling steps, poor safety awareness, after reports of not feeling good pt sits on Rollator without engaging brakes.         Balance Overall balance assessment: Needs assistance Sitting-balance support: No upper extremity supported;Feet supported Sitting balance-Leahy Scale: Good     Standing balance support: Single extremity supported Standing balance-Leahy Scale: Poor Standing balance comment: rollator and supervision for static standing                            Cognition Arousal/Alertness: Awake/alert Behavior During Therapy: Flat affect Overall Cognitive Status: History of cognitive impairments - at baseline                                 General Comments: Pt reports memory loss         General Comments General comments (skin integrity, edema, etc.): pt reports not feeling well with movement, refuses to answer questions about whether it is consistent with positional changes or constant, reports he just wants to go home, refuses to answer further questions, NT then arrives with w/c to wheel pt to exit, when pt asked if he thinks he needs to stay, pt does not answer and transfers to w/c      Pertinent Vitals/Pain Pain Assessment: Faces Faces Pain Scale: Hurts even  more Pain Location: back increased with ambulation Pain Descriptors / Indicators: Aching;Sore Pain Intervention(s): Limited activity within patient's tolerance;Monitored during session;Repositioned           PT Goals (current goals can now be found in the care plan section) Acute Rehab PT Goals Patient Stated Goal: not stated PT Goal Formulation: With patient Time For Goal Achievement: 09/10/20 Potential to  Achieve Goals: Good Progress towards PT goals: Not progressing toward goals - comment (limited by dizziness)    Frequency    Min 3X/week      PT Plan Current plan remains appropriate       AM-PAC PT "6 Clicks" Mobility   Outcome Measure  Help needed turning from your back to your side while in a flat bed without using bedrails?: None Help needed moving from lying on your back to sitting on the side of a flat bed without using bedrails?: None Help needed moving to and from a bed to a chair (including a wheelchair)?: A Little Help needed standing up from a chair using your arms (e.g., wheelchair or bedside chair)?: A Little Help needed to walk in hospital room?: A Little Help needed climbing 3-5 steps with a railing? : A Little 6 Click Score: 20    End of Session Equipment Utilized During Treatment: Gait belt Activity Tolerance: Patient tolerated treatment well Patient left: with call bell/phone within reach;in bed;with bed alarm set Nurse Communication: Mobility status PT Visit Diagnosis: History of falling (Z91.81);Muscle weakness (generalized) (M62.81);Other abnormalities of gait and mobility (R26.89)     Time: 2876-8115 PT Time Calculation (min) (ACUTE ONLY): 9 min  Charges:  $Therapeutic Exercise: 8-22 mins                     Kashus Karlen B. Migdalia Dk PT, DPT Acute Rehabilitation Services Pager [phone] Office [phone]    [place] 08/30/2020, 1:27 PM

## 2020-08-30 NOTE — Progress Notes (Signed)
DISCHARGE NOTE HOME Chase Parsons to be discharged Home per MD order. Discussed prescriptions and follow up appointments with the patient. Prescriptions given to patient; medication list explained in detail. Patient verbalized understanding.  Skin clean, dry and intact without evidence of skin break down, no evidence of skin tears noted. IV catheter discontinued intact. Site without signs and symptoms of complications. Dressing and pressure applied. Pt denies pain at the site currently. No complaints noted.  Patient free of lines, drains, and wounds.   An After Visit Summary (AVS) was printed and given to the patient. Patient escorted via wheelchair, and discharged home via private auto.  Arlyss Repress, RN

## 2020-08-30 NOTE — Brief Op Note (Signed)
08/26/2020 - 08/30/2020  8:42 AM  PATIENT:  Chase Parsons  65 y.o. male  PRE-OPERATIVE DIAGNOSIS:  anemia, melena, history of cirrhosis  POST-OPERATIVE DIAGNOSIS:  Gastritis no active bleeding  PROCEDURE:  Procedure(s): ESOPHAGOGASTRODUODENOSCOPY (EGD) (N/A)  SURGEON:  Surgeon(s) and Role:    * Horst Ostermiller, MD - Primary  Findings -------------- -EGD showed gastritis and gastric erosions.  No evidence of esophageal or gastric varices or active bleeding.  Recommendations ------------------------ -Start low-sodium diet -Change IV Protonix to p.o. Protonix 40 mg once a day.  -No further inpatient GI work-up planned. -GI will sign off.  Okay to discharge from GI standpoint. -Follow-up in GI clinic in 6 weeks  Otis Brace MD, FACP 08/30/2020, 8:43 AM  Contact #  [phone]

## 2020-08-30 NOTE — Progress Notes (Signed)
[facility] Gastroenterology Progress Note  Chase Parsons 65 y.o. [birth year]  CC:   GI bleed   Subjective: Patient seen and examined at bedside in the Endo unit.  Denies any acute issues.  Denies blood in the stool or black stool.   ROS : Febrile, positive for dizziness.  Negative for chest pain   Objective: Vital signs in last 24 hours: Vitals:   08/30/20 0528 08/30/20 0758  BP: 140/73 (!) 158/91  Pulse: 76 68  Resp: 18 13  Temp: 98.2 F (36.8 C) 98.7 F (37.1 C)  SpO2: 97% 100%    Physical Exam:  General:  Alert, cooperative, no distress, appears stated age  Head:  Normocephalic, without obvious abnormality, atraumatic  Eyes:  , EOM's intact,   Lungs:   Clear to auscultation bilaterally, respirations unlabored  Heart:  Regular rate and rhythm, S1, S2 normal  Abdomen:   Soft, non-tender, left upper quadrant discomfort on palpation, bowel sounds present.  No peritoneal signs  Extremities: Extremities normal, atraumatic, no  edema  Pulses: 2+ and symmetric    Lab Results: Recent Labs    08/29/20 0445 08/30/20 0427  NA 136 136  K 3.3* 3.4*  CL 97* 98  CO2 29 27  GLUCOSE 84 90  BUN 7* 8  CREATININE 0.81 0.79  CALCIUM 8.7* 8.8*   Recent Labs    08/29/20 0445 08/30/20 0427  AST 36 31  ALT 54* 50*  ALKPHOS 103 85  BILITOT 0.8 0.7  PROT 6.3* 6.6  ALBUMIN 2.9* 3.1*   Recent Labs    08/29/20 0445 08/30/20 0427  WBC 7.3 7.8  HGB 7.5* 7.7*  HCT 22.4* 23.1*  MCV 97.8 97.9  PLT 201 214   No results for input(s): LABPROT, INR in the last 72 hours.    Assessment/Plan: -Anemia with intermittent melena.  Patient with history of cirrhosis and portal hypertensive gastropathy.  No active bleeding at this time.  Hemoglobin stable. -Diarrhea.  Resolved.  GI pathogen panel and C. difficile negative. -Cirrhosis most likely from alcohol use. - ?  pancreatitis.  Could be related to underlying alcohol use.  Recommendations ------------------------- -Proceed with  EGD today.  Risks (bleeding, infection, bowel perforation that could require surgery, sedation-related changes in cardiopulmonary systems), benefits (identification and possible treatment of source of symptoms, exclusion of certain causes of symptoms), and alternatives (watchful waiting, radiographic imaging studies, empiric medical treatment)  were explained to patient/family in detail and patient wishes to proceed.   Otis Brace MD, [place] 08/30/2020, 8:21 AM  Contact #  [phone]

## 2020-08-30 NOTE — Anesthesia Postprocedure Evaluation (Signed)
Anesthesia Post Note  Patient: Chase Parsons  Procedure(s) Performed: ESOPHAGOGASTRODUODENOSCOPY (EGD) (N/A )     Patient location during evaluation: PACU Anesthesia Type: MAC Level of consciousness: awake and alert Pain management: pain level controlled Vital Signs Assessment: post-procedure vital signs reviewed and stable Respiratory status: spontaneous breathing, nonlabored ventilation and respiratory function stable Cardiovascular status: stable and blood pressure returned to baseline Postop Assessment: no apparent nausea or vomiting Anesthetic complications: no   No complications documented.  Last Vitals:  Vitals:   08/30/20 0905 08/30/20 0924  BP: (!) 146/74 (!) 153/89  Pulse: (!) 59 62  Resp: 14 16  Temp:  36.7 C  SpO2: 100% 100%    Last Pain:  Vitals:   08/30/20 0924  TempSrc: Oral  PainSc:                  Merlinda Frederick

## 2020-08-30 NOTE — Discharge Instructions (Addendum)
Hello Chase Parsons, it was a pleasure caring for you in the hospital.   You were admitted to the hospital for low blood pressure, dizziness, and falls. The symptoms you were experiencing were most likely due to taking your blood pressure medications everyday.   You had an upper endoscopy that showed no signs of bleeding, but did show inflammation of your intestine. When you get home these are instructions to follow:   1) Stop taking blood pressure medications: losartan 50 mg and amlodipine 5 mg  2) Take a multivitamin, folic acid supplement, and thiamine supplement every day  3) Avoid NSAID medications (ibuprofen or Advil, Aleve, or naproxen) 4) Avoid drinking alcohol as this can worsen the inflammation in your intestine   Below is some information on low blood pressure:   Hypotension As your heart beats, it forces blood through your body. This force is called blood pressure. If you have hypotension, you have low blood pressure. When your blood pressure is too low, you may not get enough blood to your brain or other parts of your body. This may cause you to feel weak, light-headed, have a fast heartbeat, or even pass out (faint). Low blood pressure may be harmless, or it may cause serious problems. What are the causes?  Blood loss.  Not enough water in the body (dehydration).  Heart problems.  Hormone problems.  Pregnancy.  A very bad infection.  Not having enough of certain nutrients.  Very bad allergic reactions.  Certain medicines. What increases the risk?  Age. The risk increases as you get older.  Conditions that affect the heart or the brain and spinal cord (central nervous system).  Taking certain medicines.  Being pregnant. What are the signs or symptoms?  Feeling: ? Weak. ? Light-headed. ? Dizzy. ? Tired (fatigued).  Blurred vision.  Fast heartbeat.  Passing out, in very bad cases. How is this treated?  Changing your diet. This may involve eating  more salt (sodium) or drinking more water.  Taking medicines to raise your blood pressure.  Changing how much you take (the dosage) of some of your medicines.  Wearing compression stockings. These stockings help to prevent blood clots and reduce swelling in your legs. In some cases, you may need to go to the hospital for:  Fluid replacement. This means you will receive fluids through an IV tube.  Blood replacement. This means you will receive donated blood through an IV tube (transfusion).  Treating an infection or heart problems, if this applies.  Monitoring. You may need to be monitored while medicines that you are taking wear off. Follow these instructions at home: Eating and drinking  Drink enough fluids to keep your pee (urine) pale yellow.  Eat a healthy diet. Follow instructions from your doctor about what you can eat or drink. A healthy diet includes: ? Fresh fruits and vegetables. ? Whole grains. ? Low-fat (lean) meats. ? Low-fat dairy products.  Eat extra salt only as told. Do not add extra salt to your diet unless your doctor tells you to.  Eat small meals often.  Avoid standing up quickly after you eat.   Medicines  Take over-the-counter and prescription medicines only as told by your doctor. ? Follow instructions from your doctor about changing how much you take of your medicines, if this applies. ? Do not stop or change any of your medicines on your own. General instructions  Wear compression stockings as told by your doctor.  Get up slowly from lying down  or sitting.  Avoid hot showers and a lot of heat as told by your doctor.  Return to your normal activities as told by your doctor. Ask what activities are safe for you.  Do not use any products that contain nicotine or tobacco, such as cigarettes, e-cigarettes, and chewing tobacco. If you need help quitting, ask your doctor.  Keep all follow-up visits as told by your doctor. This is important.    Contact a doctor if:  You throw up (vomit).  You have watery poop (diarrhea).  You have a fever for more than 2-3 days.  You feel more thirsty than normal.  You feel weak and tired. Get help right away if:  You have chest pain.  You have a fast or uneven heartbeat.  You lose feeling (have numbness) in any part of your body.  You cannot move your arms or your legs.  You have trouble talking.  You get sweaty or feel light-headed.  You pass out.  You have trouble breathing.  You have trouble staying awake.  You feel mixed up (confused). Summary  Hypotension is also called low blood pressure. It is when the force of blood pumping through your arteries is too weak.  Hypotension may be harmless, or it may cause serious problems.  Treatment may include changing your diet and medicines, and wearing compression stockings.  In very bad cases, you may need to go to the hospital. This information is not intended to replace advice given to you by your health care provider. Make sure you discuss any questions you have with your health care provider. Document Revised: 10/07/2017 Document Reviewed: 10/07/2017 Elsevier Patient Education  2021 [place]. to the Emergency Department

## 2020-08-30 NOTE — Transfer of Care (Signed)
Immediate Anesthesia Transfer of Care Note  Patient: Chase Parsons  Procedure(s) Performed: ESOPHAGOGASTRODUODENOSCOPY (EGD) (N/A )  Patient Location: PACU  Anesthesia Type:MAC  Level of Consciousness: drowsy  Airway & Oxygen Therapy: Patient Spontanous Breathing  Post-op Assessment: Report given to RN and Post -op Vital signs reviewed and stable  Post vital signs: Reviewed and stable  Last Vitals:  Vitals Value Taken Time  BP 139/75   Temp    Pulse 77   Resp 12   SpO2 100     Last Pain:  Vitals:   08/30/20 0758  TempSrc: Oral  PainSc: 0-No pain         Complications: No complications documented.

## 2020-08-30 NOTE — Progress Notes (Signed)
PT Cancellation Note  Patient Details Name: Oddie Bottger MRN: [id number] DOB: 1955/09/15   Cancelled Treatment:    Reason Eval/Treat Not Completed: (P) Patient at procedure or test/unavailable Pt is off the floor for procedure. PT will follow back as able.   Lavoris Canizales B. Migdalia Dk PT, DPT Acute Rehabilitation Services Pager [phone] Office [phone]  [place] 08/30/2020, 8:16 AM

## 2020-08-30 NOTE — Anesthesia Preprocedure Evaluation (Addendum)
Anesthesia Evaluation  Patient identified by MRN, date of birth, ID band Patient awake    Reviewed: Allergy & Precautions, NPO status , Patient's Chart, lab work & pertinent test results  Airway Mallampati: II  TM Distance: >3 FB Neck ROM: Full    Dental no notable dental hx.    Pulmonary neg pulmonary ROS,    Pulmonary exam normal breath sounds clear to auscultation       Cardiovascular METS: 3 - Mets hypertension, Normal cardiovascular exam Rhythm:Regular Rate:Normal     Neuro/Psych PSYCHIATRIC DISORDERS Depression negative neurological ROS     GI/Hepatic GERD  ,(+)     substance abuse  alcohol use,   Endo/Other  negative endocrine ROS  Renal/GU negative Renal ROS  negative genitourinary   Musculoskeletal negative musculoskeletal ROS (+)   Abdominal   Peds negative pediatric ROS (+)  Hematology  (+) anemia ,   Anesthesia Other Findings   Reproductive/Obstetrics negative OB ROS                            Anesthesia Physical Anesthesia Plan  ASA: III  Anesthesia Plan: MAC   Post-op Pain Management:    Induction: Intravenous  PONV Risk Score and Plan: Propofol infusion, TIVA and Treatment may vary due to age or medical condition  Airway Management Planned: Natural Airway, Simple Face Mask and Nasal Cannula  Additional Equipment:   Intra-op Plan:   Post-operative Plan:   Informed Consent: I have reviewed the patients History and Physical, chart, labs and discussed the procedure including the risks, benefits and alternatives for the proposed anesthesia with the patient or authorized representative who has indicated his/her understanding and acceptance.     Dental advisory given  Plan Discussed with: CRNA, Anesthesiologist and Surgeon  Anesthesia Plan Comments:        Anesthesia Quick Evaluation

## 2020-08-31 LAB — RPR: RPR Ser Ql: NONREACTIVE

## 2020-09-02 ENCOUNTER — Telehealth: Payer: Self-pay | Admitting: Student

## 2020-09-02 ENCOUNTER — Encounter (HOSPITAL_COMMUNITY): Payer: Self-pay | Admitting: Gastroenterology

## 2020-09-02 NOTE — Telephone Encounter (Signed)
Please call his sister back after 2 pm today to help her with the medications that were changed for this patient in the ED.

## 2020-09-06 ENCOUNTER — Encounter: Payer: Self-pay | Admitting: Student

## 2020-09-06 ENCOUNTER — Other Ambulatory Visit: Payer: Self-pay

## 2020-09-06 ENCOUNTER — Ambulatory Visit (INDEPENDENT_AMBULATORY_CARE_PROVIDER_SITE_OTHER): Payer: Medicare Other | Admitting: Student

## 2020-09-06 VITALS — BP 144/108 | HR 105 | Temp 98.2°F | Ht 72.0 in | Wt 151.2 lb

## 2020-09-06 DIAGNOSIS — Z23 Encounter for immunization: Secondary | ICD-10-CM | POA: Diagnosis not present

## 2020-09-06 DIAGNOSIS — R779 Abnormality of plasma protein, unspecified: Secondary | ICD-10-CM

## 2020-09-06 DIAGNOSIS — R634 Abnormal weight loss: Secondary | ICD-10-CM | POA: Diagnosis not present

## 2020-09-06 DIAGNOSIS — H538 Other visual disturbances: Secondary | ICD-10-CM | POA: Diagnosis not present

## 2020-09-06 DIAGNOSIS — D649 Anemia, unspecified: Secondary | ICD-10-CM | POA: Diagnosis not present

## 2020-09-06 DIAGNOSIS — I10 Essential (primary) hypertension: Secondary | ICD-10-CM | POA: Diagnosis not present

## 2020-09-06 DIAGNOSIS — F1010 Alcohol abuse, uncomplicated: Secondary | ICD-10-CM | POA: Diagnosis not present

## 2020-09-06 DIAGNOSIS — E861 Hypovolemia: Secondary | ICD-10-CM

## 2020-09-06 DIAGNOSIS — Z0000 Encounter for general adult medical examination without abnormal findings: Secondary | ICD-10-CM

## 2020-09-06 DIAGNOSIS — I9589 Other hypotension: Secondary | ICD-10-CM

## 2020-09-06 MED ORDER — AMLODIPINE BESYLATE 5 MG PO TABS: 5.0000 mg | ORAL_TABLET | Freq: Every day | ORAL | 3 refills | Status: DC

## 2020-09-06 NOTE — Progress Notes (Signed)
   CC: Hospital follow-up for hypotension, anemia, alcohol use disorder  HPI:  [name] is a 65 y.o. male with history as below presenting for follow-up on the above. Please refer to problem based charting for further details of assessment and plan of current problem and chronic medical conditions.   Past Medical History:  Diagnosis Date  . Alcohol abuse   . Alcohol use disorder, moderate, dependence ([place]) 10/24/2019  . Encephalopathy   . Hyponatremia 10/24/2019  . Pneumonia   . Severe single current episode of major depressive disorder, without psychotic features ([place]) 01/26/2015  . Suicidal thoughts    Review of Systems:   Review of Systems  Constitutional: Positive for weight loss. Negative for chills, fever and malaise/fatigue.  Eyes: Positive for blurred vision.  Respiratory: Negative for cough and shortness of breath.   Cardiovascular: Negative for chest pain and palpitations.  Gastrointestinal: Negative for abdominal pain, blood in stool, diarrhea, melena, nausea and vomiting.  Neurological: Positive for dizziness. Negative for loss of consciousness and weakness.  All other systems reviewed and are negative.    Physical Exam: Vitals:   09/06/20 0922 09/06/20 0927  BP: (!) 149/109 (!) 144/108  Pulse: (!) 108 (!) 105  Temp: 98.2 F (36.8 C)   TempSrc: Oral   SpO2: 100%   Weight: 151 lb 3.2 oz (68.6 kg)   Height: 6' (1.829 m)    Constitutional: no acute distress Head: atraumatic ENT: external ears normal Cardiovascular: regular rate and rhythm, normal heart sounds Pulmonary: effort normal, normal breath sounds bilaterally Abdominal: flat, nontender, no rebound tenderness, bowel sounds normal Skin: warm and dry Neurological: alert, no focal deficit Psychiatric: normal mood and affect  Assessment & Plan:   See Encounters Tab for problem based charting.  Patient discussed with Dr. Philipp Ovens

## 2020-09-06 NOTE — Assessment & Plan Note (Signed)
-   Pneumonia vaccine administered - Counseled on COVID-vaccine, they will inquire about this the next time to go to the pharmacy

## 2020-09-06 NOTE — Assessment & Plan Note (Addendum)
Patient with hemoglobin of 7.7 on day of discharge from hospital last week. It was 11.4 on admission and thought partially dilutional as patient was severely dehydrated on admission. 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  He has been abstaining from alcohol over the past week has not had any further melena.    His MCV is at times mildly macrocytic, other times at the upper limit of normal.  He had iron studies last in June 2021 consistent with anemia of chronic disease.  Had normal vitamin B12 about 10 days ago while hospitalized.  Currently on B12 supplementation.  He is currently being seen at [facility] and considering colonoscopy to further work-up his anemia.  He saw them yesterday and has an appointment for follow-up.  - Repeat CBC - Check iron studies - Follow-up with GI for possible colonoscopy

## 2020-09-06 NOTE — Assessment & Plan Note (Signed)
Patient has longstanding history of blurred vision bilaterally.  Has never had glasses.  Reports that he was briefly told he had cataracts.  - Referral to ophthalmology

## 2020-09-06 NOTE — Assessment & Plan Note (Signed)
This problem has since resolved. BP: (!) 144/108 Likely secondary to alcoholic gastritis, had endoscopy on recent hospitalization with gastritis.  His symptoms have since resolved while standing from alcohol over the past week.  We will address alcohol use elsewhere.

## 2020-09-06 NOTE — Assessment & Plan Note (Signed)
Patient noted to have weight loss at recent hospitalization from 164 pounds in March to 140 pounds.  Today, he measures 151 pounds. He is up to date on cancer screenings.  Noted to have anemia, follow-up with GI to consider colonoscopy. Also noted to have elevated protein at times including on recent admission of 8.6, but this corrected to 6.6 by discharge, likely was elevated due to hemoconcentration. If this recurs, can test for multiple myeloma.  -Repeat CMP in - Follow-up with GI to consider colonoscopy for anemia - If weight loss continues and protein gap is persistence and colonoscopy does not reveal a source, consider multiple myeloma work-up with [facility] and SPEP

## 2020-09-06 NOTE — Assessment & Plan Note (Signed)
Has been struggling with alcohol use for many years now.  He maintain abstinence for roughly 6 months earlier this year after hospitalization for alcohol withdrawal.  He is staying with his sister and brother-in-law who have been extremely helpful.  He started drinking again a few months ago, ultimately culminating in an episode of alcoholic gastritis complicated by severe hypotension for which was hospitalized for 5 days last week.  At this time is motivated to abstain again, though knowledges it has been difficult for him.  His family is very helpful and he feels that they can be encouraging.  Encourage patient to fill his daily other activities like walking in the nearby park or finding a part-time job.  - Assess for alcohol use at next visit - Unable to use naltrexone due to cirrhosis

## 2020-09-06 NOTE — Assessment & Plan Note (Signed)
BP: (!) 144/108 Recently has had issues with hypotension, likely in the setting of dehydration from gastritis.  Appropriately, his blood pressure medications were held upon hospital discharge, so this is in the setting of no blood pressure medications over the past week.  Denies chest pain, palpitations, headaches, LOC.  - Restart amlodipine 5 mg daily - Follow-up in 3 months

## 2020-09-06 NOTE — Patient Instructions (Signed)
Thank you for allowing [place] to be a part of your care today, it was a pleasure seeing you. We discussed your alcohol use, hypertension, blurry vision, anemia  Congratulations on staying sober over the past week.  I think that ultimately, staying away from alcohol is going to be the most important thing you can do for your health.  Please try to abstain.  Let us know if you are having any issues, there are a couple of things that we can do to help with this.  For your blurred vision, and refer you to eye doctor.  They will call you to schedule an appointment.  For your anemia, I am rechecking some labs today.  I will call with results, likely on Monday unless it is an urgent issue.  For your high blood pressure, I am restarting amlodipine 5 mg.  I am checking these labs: CMP, CBC  I have made these changes to your medications: Restart Lopid 5 mg  Please follow up in 6 months   Thank you, and please call the Internal Medicine Clinic at [phone] if you have any questions.  Best, Dr. Bridgett Larsson

## 2020-09-07 LAB — CMP14 + ANION GAP
ALT: 175 IU/L — ABNORMAL HIGH (ref 0–44)
AST: 267 IU/L — ABNORMAL HIGH (ref 0–40)
Albumin/Globulin Ratio: 1.2 (ref 1.2–2.2)
Albumin: 4.7 g/dL (ref 3.8–4.8)
Alkaline Phosphatase: 136 IU/L — ABNORMAL HIGH (ref 44–121)
Anion Gap: 21 mmol/L — ABNORMAL HIGH (ref 10.0–18.0)
BUN/Creatinine Ratio: 14 (ref 10–24)
BUN: 13 mg/dL (ref 8–27)
Bilirubin Total: 1.1 mg/dL (ref 0.0–1.2)
CO2: 22 mmol/L (ref 20–29)
Calcium: 9.5 mg/dL (ref 8.6–10.2)
Chloride: 95 mmol/L — ABNORMAL LOW (ref 96–106)
Creatinine, Ser: 0.91 mg/dL (ref 0.76–1.27)
Globulin, Total: 3.8 g/dL (ref 1.5–4.5)
Glucose: 102 mg/dL — ABNORMAL HIGH (ref 65–99)
Potassium: 4.2 mmol/L (ref 3.5–5.2)
Sodium: 138 mmol/L (ref 134–144)
Total Protein: 8.5 g/dL (ref 6.0–8.5)
eGFR: 94 mL/min/{1.73_m2} (ref >59)

## 2020-09-07 LAB — CBC
Hematocrit: 32.6 % — ABNORMAL LOW (ref 37.5–51.0)
Hemoglobin: 11 g/dL — ABNORMAL LOW (ref 13.0–17.7)
MCH: 32.9 pg (ref 26.6–33.0)
MCHC: 33.7 g/dL (ref 31.5–35.7)
MCV: 98 fL — ABNORMAL HIGH (ref 79–97)
Platelets: 354 10*3/uL (ref 150–450)
RBC: 3.34 x10E6/uL — ABNORMAL LOW (ref 4.14–5.80)
RDW: 13.1 % (ref 11.6–15.4)
WBC: 4.3 10*3/uL (ref 3.4–10.8)

## 2020-09-07 LAB — FERRITIN: Ferritin: 4524 ng/mL — ABNORMAL HIGH (ref 30–400)

## 2020-09-07 LAB — IRON AND TIBC
Iron Saturation: >94 % (ref 15–55)
Iron: 254 ug/dL — ABNORMAL HIGH (ref 38–169)
Total Iron Binding Capacity: <271 ug/dL (ref 250–450)
UIBC: <17 ug/dL — ABNORMAL LOW (ref 111–343)

## 2020-09-09 ENCOUNTER — Telehealth: Payer: Self-pay | Admitting: Internal Medicine

## 2020-09-09 DIAGNOSIS — D649 Anemia, unspecified: Secondary | ICD-10-CM

## 2020-09-09 NOTE — Telephone Encounter (Signed)
Called and discussed results with patient's sister. If patient has questions he will call the clinic and ask for Dr.Pennye Beeghly. Dr.Chen is not in clinic this week and I am following these results up from patients visit on 5/13. See Dr.Guilloud's attestation of Dr.Chen's note from 5/13.  Referral for hematology as been placed.

## 2020-09-09 NOTE — Telephone Encounter (Signed)
Called patient this morning to review lab work ordered by Dr.Chen last week. Dr.Chen in not in the office this week. The phone call went to voicemail. His contact number is his sister's cell phone. I left a message and will call back this afternoon.

## 2020-09-09 NOTE — Progress Notes (Signed)
Internal Medicine Clinic Attending  Case discussed with Dr. Bridgett Larsson  At the time of the visit.  We reviewed the resident's history and exam and pertinent patient test results.  I agree with the assessment, diagnosis, and plan of care documented in the resident's note.  Iron, iron saturation, and ferritin are all significantly elevated. I worry about hemochromatosis especially with his cirrhosis. Does not look like he is on iron supplements, so I don't think this is iatrogenic. Would recommend hematology referral for this and his anemia.

## 2020-09-11 ENCOUNTER — Telehealth: Payer: Self-pay | Admitting: Physician Assistant

## 2020-09-11 NOTE — Telephone Encounter (Signed)
Received a new hem referral from [name] for iron overload/anemia. I received a call back from Chase Parsons's sister and scheduled an appt on 6/29 at [facility] per her request. Aware to arrive 20 minutes early. Letter mailed.

## 2020-09-12 ENCOUNTER — Ambulatory Visit: Payer: Medicare Other

## 2020-09-30 NOTE — Progress Notes (Signed)
HPI:  Chase Parsons is a 65 y.o. male patient of Dr Oval Linsey referred to hypertension clinic for follow up. PMH includes alcohol abuse, hypertension, cirrhosis, and depression.  Social worker notes that she has spoken with patient about resources for alcohol cessation, he currently is living with his sister. Patient continues to drink 2+ 40 oz beers each evening.  Noted ED on 08/26/20 for hypotension d/t hypovolemia. Patient also anemic with Hgb at 7.7. Losartan and amlodipine were held on discharge from emergency room. Per PCP instructions, amlodipine 5mg  was resumed on 09/06/2020.  Patient presents today accompany by his brother. And reports compliance with amlodipine 5mg  daily.  Blood Pressure Goal:  < 130/80  Current Medications: amlodipine 5 mg daily ~ 8:30am  Family Hx: mother had htn, died about 5-6 years; father w/alcohol issues  Social Hx: no tobacco, 80 +oz beer/day, no regular caffeine  Diet:  Didn't eat breakfast this am, 2-3 40oz beer last night, no time for food this am. Usually some variation of bacon/eggs/toast/pancakes for breakfast; doesn't like fruits and vegetables; last night had chicken nuggets for dinner.  Exercise:  None recently, feeling lazy recently - previously walking in neighborhood  Home BP readings: no home readings recently (120-140s)  Intolerances: nkda  Labs: 3/22: Na 135, K 3.2, Glu 148, BUN 9, SCr 1.07   Wt Readings from Last 3 Encounters:  09/06/20 151 lb 3.2 oz (68.6 kg)  08/26/20 140 lb (63.5 kg)  08/26/20 149 lb 14.4 oz (68 kg)   BP Readings from Last 3 Encounters:  10/01/20 110/82  09/06/20 (!) 144/108  08/30/20 (!) 153/89   Pulse Readings from Last 3 Encounters:  10/01/20 96  09/06/20 (!) 105  08/30/20 62    Current Outpatient Medications  Medication Sig Dispense Refill  . amLODipine (NORVASC) 5 MG tablet Take 1 tablet (5 mg total) by mouth daily. 90 tablet 3  . FLUoxetine (PROZAC) 10 MG capsule Take 1 capsule by mouth once  daily (Patient taking differently: Take 10 mg by mouth daily.) 90 capsule 0  . folic acid (FOLVITE) 1 MG tablet Take 1 tablet by mouth once daily (Patient taking differently: Take 1 mg by mouth daily.) 90 tablet 0  . pantoprazole (PROTONIX) 40 MG tablet Take 1 tablet (40 mg total) by mouth daily. 90 tablet 1  . thiamine (VITAMIN B-1) 100 MG tablet Take 100 mg by mouth daily.    . vitamin B-12 1000 MCG tablet Take 1 tablet (1,000 mcg total) by mouth daily. 30 tablet 0   No current facility-administered medications for this visit.    No Known Allergies  Past Medical History:  Diagnosis Date  . Alcohol abuse   . Alcohol use disorder, moderate, dependence ([place]) 10/24/2019  . Encephalopathy   . Hyponatremia 10/24/2019  . Pneumonia   . Severe single current episode of major depressive disorder, without psychotic features ([place]) 01/26/2015  . Suicidal thoughts     Blood pressure 110/82, pulse 96, resp. rate 15, height 6' (1.829 m), SpO2 95 %.   Hypertension Blood pressure well controlled today. Patient continues to drink two 40 onz beers per day. Denies dizziness, swelling, fatigue or headaches. Reports compliance with amlodipine 5mg  daily as well.   Encouraged patient to decrease added salt to already cooked food, and to drink more water during the day, especially on hot days.  Will continue current medication without changes, and follow up in 7-8 weeks.    Moranda Billiot Rodriguez-Guzman PharmD, BCPS, [place]  [place] [address] 10/01/2020 9:43 AM

## 2020-10-01 ENCOUNTER — Other Ambulatory Visit: Payer: Self-pay

## 2020-10-01 ENCOUNTER — Ambulatory Visit (INDEPENDENT_AMBULATORY_CARE_PROVIDER_SITE_OTHER): Payer: Medicare Other | Admitting: Pharmacist

## 2020-10-01 VITALS — BP 110/82 | HR 96 | Resp 15 | Ht 72.0 in

## 2020-10-01 DIAGNOSIS — I10 Essential (primary) hypertension: Secondary | ICD-10-CM

## 2020-10-01 NOTE — Patient Instructions (Addendum)
Return for a  follow up appointment in 6 weeks  Check your blood pressure at home daily (if able) and keep record of the readings.  Take your BP meds as follows: *NO CHANGE*  Bring all of your meds, your BP cuff and your record of home blood pressures to your next appointment.  Exercise as you're able, try to walk approximately 30 minutes per day.  Keep salt intake to a minimum, especially watch canned and prepared boxed foods.  Eat more fresh fruits and vegetables and fewer canned items.  Avoid eating in fast food restaurants.    HOW TO TAKE YOUR BLOOD PRESSURE: . Rest 5 minutes before taking your blood pressure. .  Don't smoke or drink caffeinated beverages for at least 30 minutes before. . Take your blood pressure before (not after) you eat. . Sit comfortably with your back supported and both feet on the floor (don't cross your legs). . Elevate your arm to heart level on a table or a desk. . Use the proper sized cuff. It should fit smoothly and snugly around your bare upper arm. There should be enough room to slip a fingertip under the cuff. The bottom edge of the cuff should be 1 inch above the crease of the elbow. . Ideally, take 3 measurements at one sitting and record the average.

## 2020-10-01 NOTE — Assessment & Plan Note (Signed)
Blood pressure well controlled today. Patient continues to drink two 40 onz beers per day. Denies dizziness, swelling, fatigue or headaches. Reports compliance with amlodipine 5mg  daily as well.   Encouraged patient to decrease added salt to already cooked food, and to drink more water during the day, especially on hot days.  Will continue current medication without changes, and follow up in 7-8 weeks.

## 2020-10-09 ENCOUNTER — Encounter: Payer: Self-pay | Admitting: *Deleted

## 2020-10-20 ENCOUNTER — Other Ambulatory Visit: Payer: Self-pay | Admitting: Internal Medicine

## 2020-10-20 DIAGNOSIS — Z87898 Personal history of other specified conditions: Secondary | ICD-10-CM

## 2020-10-20 DIAGNOSIS — F322 Major depressive disorder, single episode, severe without psychotic features: Secondary | ICD-10-CM

## 2020-10-22 NOTE — Telephone Encounter (Signed)
Can we arrange an office with him with Dr. Raymondo Band for either the first 2 weeks of July or in September? Thank you

## 2020-10-23 ENCOUNTER — Encounter: Payer: Self-pay | Admitting: Physician Assistant

## 2020-10-23 ENCOUNTER — Inpatient Hospital Stay: Payer: Medicare Other

## 2020-10-23 ENCOUNTER — Other Ambulatory Visit: Payer: Self-pay

## 2020-10-23 ENCOUNTER — Inpatient Hospital Stay: Payer: Medicare Other | Attending: Physician Assistant | Admitting: Physician Assistant

## 2020-10-23 VITALS — BP 144/102 | HR 93 | Temp 98.0°F | Resp 18 | Ht 72.0 in | Wt 151.3 lb

## 2020-10-23 DIAGNOSIS — R7989 Other specified abnormal findings of blood chemistry: Secondary | ICD-10-CM

## 2020-10-23 DIAGNOSIS — D649 Anemia, unspecified: Secondary | ICD-10-CM | POA: Insufficient documentation

## 2020-10-23 DIAGNOSIS — Z79899 Other long term (current) drug therapy: Secondary | ICD-10-CM | POA: Diagnosis not present

## 2020-10-23 LAB — CBC WITH DIFFERENTIAL (CANCER CENTER ONLY)
Abs Immature Granulocytes: 0.02 10*3/uL (ref 0.00–0.07)
Basophils Absolute: 0 10*3/uL (ref 0.0–0.1)
Basophils Relative: 1 %
Eosinophils Absolute: 0.1 10*3/uL (ref 0.0–0.5)
Eosinophils Relative: 2 %
HCT: 33.7 % — ABNORMAL LOW (ref 39.0–52.0)
Hemoglobin: 11.4 g/dL — ABNORMAL LOW (ref 13.0–17.0)
Immature Granulocytes: 0 %
Lymphocytes Relative: 23 %
Lymphs Abs: 1.5 10*3/uL (ref 0.7–4.0)
MCH: 33.4 pg (ref 26.0–34.0)
MCHC: 33.8 g/dL (ref 30.0–36.0)
MCV: 98.8 fL (ref 80.0–100.0)
Monocytes Absolute: 0.8 10*3/uL (ref 0.1–1.0)
Monocytes Relative: 12 %
Neutro Abs: 4.1 10*3/uL (ref 1.7–7.7)
Neutrophils Relative %: 62 %
Platelet Count: 239 10*3/uL (ref 150–400)
RBC: 3.41 MIL/uL — ABNORMAL LOW (ref 4.22–5.81)
RDW: 11.8 % (ref 11.5–15.5)
WBC Count: 6.6 10*3/uL (ref 4.0–10.5)
nRBC: 0 % (ref 0.0–0.2)

## 2020-10-23 LAB — CMP (CANCER CENTER ONLY)
ALT: 43 U/L (ref 0–44)
AST: 47 U/L — ABNORMAL HIGH (ref 15–41)
Albumin: 3.9 g/dL (ref 3.5–5.0)
Alkaline Phosphatase: 110 U/L (ref 38–126)
Anion gap: 15 (ref 5–15)
BUN: 8 mg/dL (ref 8–23)
CO2: 25 mmol/L (ref 22–32)
Calcium: 8.2 mg/dL — ABNORMAL LOW (ref 8.9–10.3)
Chloride: 101 mmol/L (ref 98–111)
Creatinine: 0.94 mg/dL (ref 0.61–1.24)
GFR, Estimated: >60 mL/min (ref >60)
Glucose, Bld: 89 mg/dL (ref 70–99)
Potassium: 3.7 mmol/L (ref 3.5–5.1)
Sodium: 141 mmol/L (ref 135–145)
Total Bilirubin: 1 mg/dL (ref 0.3–1.2)
Total Protein: 8.3 g/dL — ABNORMAL HIGH (ref 6.5–8.1)

## 2020-10-23 LAB — SEDIMENTATION RATE: Sed Rate: 31 mm/hr — ABNORMAL HIGH (ref 0–16)

## 2020-10-23 LAB — IRON AND TIBC
Iron: 91 ug/dL (ref 42–163)
Saturation Ratios: 37 % (ref 20–55)
TIBC: 245 ug/dL (ref 202–409)
UIBC: 154 ug/dL (ref 117–376)

## 2020-10-23 LAB — RETIC PANEL
Immature Retic Fract: 12.4 % (ref 2.3–15.9)
RBC.: 3.42 MIL/uL — ABNORMAL LOW (ref 4.22–5.81)
Retic Count, Absolute: 59.5 10*3/uL (ref 19.0–186.0)
Retic Ct Pct: 1.7 % (ref 0.4–3.1)
Reticulocyte Hemoglobin: 34.7 pg (ref >27.9)

## 2020-10-23 LAB — FERRITIN: Ferritin: 553 ng/mL — ABNORMAL HIGH (ref 24–336)

## 2020-10-23 LAB — C-REACTIVE PROTEIN: CRP: 0.5 mg/dL (ref <1.0)
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
## 2020-10-24 ENCOUNTER — Telehealth: Payer: Self-pay | Admitting: Physician Assistant

## 2020-10-24 NOTE — Telephone Encounter (Signed)
I called and spoke to patient's sister, Valda Lamb, and reviewed available lab results from yesterday 10/23/2020.   Ferritin levels have improved 4534 to 553. Sed rate is elevated at 31. Hgb level has improved to 11.4. Hemochromatosis DNA test is still pending. From initial labs, likely cause of  rise in ferritin levels is pointing towards underlying liver disease rather than hemochromatosis. Once remaining labs confirm this, recommend a referral to hepatologist. I will follow up if our plans or recommendations change.   Ms. Barnet Pall expressed understanding of the plan and will relay our recommendations to Mr. Trumbull.

## 2020-10-29 LAB — HEMOCHROMATOSIS DNA-PCR(C282Y,H63D)

## 2020-11-04 ENCOUNTER — Telehealth: Payer: Self-pay | Admitting: *Deleted

## 2020-11-04 ENCOUNTER — Encounter: Payer: Medicare Other | Admitting: Internal Medicine

## 2020-11-04 NOTE — Telephone Encounter (Signed)
Called patient left voice message for patient to return call to the clinic at [phone] to reschedule this missed appointment.

## 2020-11-26 ENCOUNTER — Other Ambulatory Visit: Payer: Self-pay

## 2020-11-26 ENCOUNTER — Ambulatory Visit (INDEPENDENT_AMBULATORY_CARE_PROVIDER_SITE_OTHER): Payer: Medicare Other | Admitting: Pharmacist Clinician (PhC)/ Clinical Pharmacy Specialist

## 2020-11-26 DIAGNOSIS — I10 Essential (primary) hypertension: Secondary | ICD-10-CM

## 2020-11-26 NOTE — Progress Notes (Signed)
HPI:  Chase Parsons is a 65 y.o. male patient of Dr Oval Linsey referred to hypertension clinic for follow up. PMH includes alcohol abuse, hypertension, cirrhosis, and depression.  Social worker notes that she has spoken with patient about resources for alcohol cessation, he currently is living with his sister. Patient continues to drink 2+ 40 oz beers each evening.  In May was at ED with hypotension d/t hypovolemia.  Losartan and amlodipine were both held at discharge and he was later re-started on the amlodipine.  At his last visit pressure was well controlled and no changes were made.    Today he returns for follow up.  Pressure somewhat higher today, he does admit to fried chicken and at least 2 x 40 oz beers last night.  Did not eat any breakfast this morning.  His sister and brother in law look out for him, and brother in law notes that he has not checked his pressure for several weeks.  He does keep patient's medications and doles out his daily doses.  States patient is compliant about taking everything.    Blood Pressure Goal:  < 130/80  Current Medications: amlodipine 5 mg daily ~ 8:30am  Family Hx: mother had htn, died about 5-6 years; father w/alcohol issues  Social Hx: no tobacco, 80 +oz beer/day, no regular caffeine  Diet:  2-3 40oz beer last night, doesn't eat much breakfast, had fried chicken and rice for dinner last night.    Exercise:  walks about 4 blocks most days to store to buy beer.    Home BP readings: no home readings recently (120-140s)  Intolerances: nkda  Labs: 3/22: Na 135, K 3.2, Glu 148, BUN 9, SCr 1.07   Wt Readings from Last 3 Encounters:  11/26/20 149 lb 3.2 oz (67.7 kg)  10/23/20 151 lb 4.8 oz (68.6 kg)  09/06/20 151 lb 3.2 oz (68.6 kg)   BP Readings from Last 3 Encounters:  11/26/20 (!) 138/96  10/23/20 (!) 144/102  10/01/20 110/82   Pulse Readings from Last 3 Encounters:  11/26/20 (!) 112  10/23/20 93  10/01/20 96    Current Outpatient  Medications  Medication Sig Dispense Refill   amLODipine (NORVASC) 5 MG tablet Take 1 tablet (5 mg total) by mouth daily. 90 tablet 3   FLUoxetine (PROZAC) 10 MG capsule Take 1 capsule by mouth once daily 90 capsule 0   folic acid (FOLVITE) 1 MG tablet Take 1 tablet by mouth once daily 90 tablet 0   pantoprazole (PROTONIX) 40 MG tablet Take 1 tablet (40 mg total) by mouth daily. 90 tablet 1   XIFAXAN 550 MG TABS tablet Take 1 tablet by mouth in the morning and at bedtime.     No current facility-administered medications for this visit.    No Known Allergies  Past Medical History:  Diagnosis Date   Alcohol abuse    Alcohol use disorder, moderate, dependence ([place]) 10/24/2019   Encephalopathy    Hyponatremia 10/24/2019   Pneumonia    Severe single current episode of major depressive disorder, without psychotic features ([place]) 01/26/2015   Suicidal thoughts     Blood pressure (!) 138/96, pulse (!) 112, resp. rate 17, height '5\' 8"'$  (1.727 m), weight 149 lb 3.2 oz (67.7 kg), SpO2 96 %.   Pressure decreased to 128/92 at end of visit.    Hypertension Patient with diastolic hypertension today, currently taking amlodipine 5 mg daily.  At last visit BP was well controlled.  Will have him  check home BP 3 times per week and call in about 1 month with home readings.  May need to add losartan back for diastolic control if remains elevated.  He will need to follow up with Dr. Oval Linsey in 3 months.     Tommy Medal PharmD CPP [facility] [address] [place] [id number] 11/26/2020 9:23 AM

## 2020-11-26 NOTE — Assessment & Plan Note (Signed)
Patient with diastolic hypertension today, currently taking amlodipine 5 mg daily.  At last visit BP was well controlled.  Will have him check home BP 3 times per week and call in about 1 month with home readings.  May need to add losartan back for diastolic control if remains elevated.  He will need to follow up with Dr. Oval Linsey in 3 months.

## 2020-11-26 NOTE — Patient Instructions (Signed)
Return for a a follow up appointment with Dr. Oval Linsey in 3 months  Call me in 1 month to let me know what your home BP readings are.  Adynn Caseres/Chris at [phone]  Check your blood pressure at home at least 3 days each week and keep record of the readings.  Take your BP meds as follows:   Continue with the amlodipine  Bring all of your meds, your BP cuff and your record of home blood pressures to your next appointment.  Exercise as you're able, try to walk approximately 30 minutes per day.  Keep salt intake to a minimum, especially watch canned and prepared boxed foods.  Eat more fresh fruits and vegetables and fewer canned items.  Avoid eating in fast food restaurants.    HOW TO TAKE YOUR BLOOD PRESSURE: Rest 5 minutes before taking your blood pressure.  Don't smoke or drink caffeinated beverages for at least 30 minutes before. Take your blood pressure before (not after) you eat. Sit comfortably with your back supported and both feet on the floor (don't cross your legs). Elevate your arm to heart level on a table or a desk. Use the proper sized cuff. It should fit smoothly and snugly around your bare upper arm. There should be enough room to slip a fingertip under the cuff. The bottom edge of the cuff should be 1 inch above the crease of the elbow. Ideally, take 3 measurements at one sitting and record the average.

## 2021-01-01 ENCOUNTER — Other Ambulatory Visit: Payer: Self-pay | Admitting: *Deleted

## 2021-01-01 DIAGNOSIS — Z87898 Personal history of other specified conditions: Secondary | ICD-10-CM

## 2021-01-01 MED ORDER — FOLIC ACID 1 MG PO TABS: 1.0000 mg | ORAL_TABLET | Freq: Every day | ORAL | 0 refills | Status: DC

## 2021-01-02 ENCOUNTER — Telehealth: Payer: Self-pay

## 2021-01-02 NOTE — Telephone Encounter (Signed)
Tried to call to retrieve bp readings but it said the call couldn't go through and we have tried multiple times

## 2021-01-02 NOTE — Telephone Encounter (Signed)
-----   Message from Rockne Menghini, [place] sent at 01/02/2021  8:39 AM EDT ----- Regarding: FW: BP readings Patti Shorb  Can you call the brother-in-law Herbie Baltimore and ask him how Markese's BP is doing and if he would share some numbers for the past few weeks  Thank you!  ----- Message ----- From: Rockne Menghini, RPH-CPP Sent: 12/31/2020  12:00 AM EDT To: Rockne Menghini, RPH-CPP Subject: BP readings                                    Call brother in law Robert to get BP readings

## 2021-01-19 ENCOUNTER — Other Ambulatory Visit: Payer: Self-pay | Admitting: Internal Medicine

## 2021-01-19 DIAGNOSIS — F322 Major depressive disorder, single episode, severe without psychotic features: Secondary | ICD-10-CM

## 2021-01-31 LAB — PROTIME-INR
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
## 2021-02-25 ENCOUNTER — Other Ambulatory Visit: Payer: Self-pay

## 2021-02-25 ENCOUNTER — Ambulatory Visit (INDEPENDENT_AMBULATORY_CARE_PROVIDER_SITE_OTHER): Payer: Medicare Other | Admitting: Cardiovascular Disease

## 2021-02-25 ENCOUNTER — Encounter (HOSPITAL_BASED_OUTPATIENT_CLINIC_OR_DEPARTMENT_OTHER): Payer: Self-pay | Admitting: Cardiovascular Disease

## 2021-02-25 DIAGNOSIS — K7030 Alcoholic cirrhosis of liver without ascites: Secondary | ICD-10-CM

## 2021-02-25 DIAGNOSIS — I10 Essential (primary) hypertension: Secondary | ICD-10-CM

## 2021-02-25 DIAGNOSIS — I2510 Atherosclerotic heart disease of native coronary artery without angina pectoris: Secondary | ICD-10-CM

## 2021-02-25 HISTORY — DX: Atherosclerotic heart disease of native coronary artery without angina pectoris: I25.10

## 2021-02-25 NOTE — Assessment & Plan Note (Addendum)
Asymptomatic.  Noted on CT scan.  He is not on aspirin given his history of GI bleed, gastritis, and esophageal varices.  No statin given his history of cirrhosis.  LDL was 91 on 06/2020.

## 2021-02-25 NOTE — Assessment & Plan Note (Signed)
He was congratulated on limiting his drinking.

## 2021-02-25 NOTE — Patient Instructions (Addendum)
Medication Instructions:  Your physician recommends that you continue on your current medications as directed. Please refer to the Current Medication list given to you today.   *If you need a refill on your cardiac medications before your next appointment, please call your pharmacy*  Lab Work: NONE  Testing/Procedures: NONE  Follow-Up: At [facility], you and your health needs are our priority.  As part of our continuing mission to provide you with exceptional heart care, we have created designated Provider Care Teams.  These Care Teams include your primary Cardiologist (physician) and Advanced Practice Providers (APPs -  Physician Assistants and Nurse Practitioners) who all work together to provide you with the care you need, when you need it.  We recommend signing up for the patient portal called "MyChart".  Sign up information is provided on this After Visit Summary.  MyChart is used to connect with patients for Virtual Visits (Telemedicine).  Patients are able to view lab/test results, encounter notes, upcoming appointments, etc.  Non-urgent messages can be sent to your provider as well.   To learn more about what you can do with MyChart, go to [email or web address].    Your next appointment:   6 month(s)  The format for your next appointment:   In Person  Provider:   Skeet Latch, MD   Other Instructions  MONITOR YOUR BLOOD PRESSURE AT HOME  IF TOP NUMBER BELOW 120 HOLD AMLODIPINE, IF THIS IS HAPPENING [place]

## 2021-02-25 NOTE — Assessment & Plan Note (Signed)
BP is much better controlled.  His BP is still somewhat labile, but has stabilized somewhat.  His brother in law is going to start back checking his blood pressure daily before he gives him his medicine.  If his systolic blood pressures less than 120, then he will not give him the amlodipine.  If you find that he has to do this regularly he will give our office a call and we may need to reduce the dose.

## 2021-04-10 ENCOUNTER — Other Ambulatory Visit: Payer: Self-pay | Admitting: Gastroenterology

## 2021-04-10 DIAGNOSIS — K7030 Alcoholic cirrhosis of liver without ascites: Secondary | ICD-10-CM

## 2021-04-17 ENCOUNTER — Ambulatory Visit
Admission: RE | Admit: 2021-04-17 | Discharge: 2021-04-17 | Disposition: A | Discharge status: Home / Self Care | Payer: Medicare Other | Source: Ambulatory Visit | Attending: Gastroenterology | Admitting: Gastroenterology

## 2021-04-17 DIAGNOSIS — K7030 Alcoholic cirrhosis of liver without ascites: Secondary | ICD-10-CM

## 2021-04-29 ENCOUNTER — Other Ambulatory Visit: Payer: Medicare Other

## 2021-08-07 ENCOUNTER — Emergency Department (HOSPITAL_COMMUNITY): Payer: Medicare Other

## 2021-08-07 ENCOUNTER — Emergency Department (HOSPITAL_COMMUNITY)
Admission: EM | Admit: 2021-08-07 | Discharge: 2021-08-07 | Disposition: A | Discharge status: Home / Self Care | Payer: Medicare Other | Attending: Emergency Medicine | Admitting: Emergency Medicine

## 2021-08-07 ENCOUNTER — Encounter (HOSPITAL_COMMUNITY): Payer: Self-pay

## 2021-08-07 ENCOUNTER — Other Ambulatory Visit: Payer: Self-pay

## 2021-08-07 DIAGNOSIS — R079 Chest pain, unspecified: Secondary | ICD-10-CM | POA: Insufficient documentation

## 2021-08-07 DIAGNOSIS — Z5321 Procedure and treatment not carried out due to patient leaving prior to being seen by health care provider: Secondary | ICD-10-CM | POA: Diagnosis not present

## 2021-08-07 LAB — BASIC METABOLIC PANEL
Anion gap: 11 (ref 5–15)
BUN: 7 mg/dL — ABNORMAL LOW (ref 8–23)
CO2: 25 mmol/L (ref 22–32)
Calcium: 8.8 mg/dL — ABNORMAL LOW (ref 8.9–10.3)
Chloride: 101 mmol/L (ref 98–111)
Creatinine, Ser: 0.93 mg/dL (ref 0.61–1.24)
GFR, Estimated: >60 mL/min (ref >60)
Glucose, Bld: 133 mg/dL — ABNORMAL HIGH (ref 70–99)
Potassium: 3.2 mmol/L — ABNORMAL LOW (ref 3.5–5.1)
Sodium: 137 mmol/L (ref 135–145)

## 2021-08-07 LAB — CBC
HCT: 37.4 % — ABNORMAL LOW (ref 39.0–52.0)
Hemoglobin: 12.5 g/dL — ABNORMAL LOW (ref 13.0–17.0)
MCH: 32.4 pg (ref 26.0–34.0)
MCHC: 33.4 g/dL (ref 30.0–36.0)
MCV: 96.9 fL (ref 80.0–100.0)
Platelets: 214 10*3/uL (ref 150–400)
RBC: 3.86 MIL/uL — ABNORMAL LOW (ref 4.22–5.81)
RDW: 12.5 % (ref 11.5–15.5)
WBC: 4.9 10*3/uL (ref 4.0–10.5)
nRBC: 0 % (ref 0.0–0.2)

## 2021-08-07 LAB — TROPONIN I (HIGH SENSITIVITY)
Troponin I (High Sensitivity): 4 ng/L (ref <18)
Troponin I (High Sensitivity): 4 ng/L (ref <18)

## 2021-08-07 NOTE — ED Triage Notes (Signed)
Pt c/o intermittent non radiating right sided chest painx1wk. Pt denies N/V, SOB.  ?

## 2021-08-07 NOTE — ED Provider Triage Note (Signed)
Emergency Medicine Provider Triage Evaluation Note ? ?Orrie Schubert , a 66 y.o. male  was evaluated in triage.  Pt complains of chest pain intermittently for the past week. He reports the pain last for a few seconds and happens not every day, but occasionally once per day. He reports his chest pain when he woke up this morning, but none now. Denies any SOB or palpitations. Denies any nausea or diaphoresis. ? ?Review of Systems  ?Positive:  ?Negative:  ? ?Physical Exam  ?BP (!) 130/93 (BP Location: Left Arm)   Pulse 99   Temp 97.7 ?F (36.5 ?C)   Resp 14   SpO2 100%  ?Gen:   Awake, no distress   ?Resp:  Normal effort  ?MSK:   Moves extremities without difficulty  ?Other:  RRR, not tachycardic now.  ? ?Medical Decision Making  ?Medically screening exam initiated at 2:08 PM.  Appropriate orders placed.  Juwan Vences was informed that the remainder of the evaluation will be completed by another provider, this initial triage assessment does not replace that evaluation, and the importance of remaining in the ED until their evaluation is complete. ? ?Will order chest pain workup. EKG shows sinus tachy. ?  ?Sherrell Puller, PA-C ?08/07/21 1411 ? ?

## 2021-08-07 NOTE — ED Notes (Signed)
Pt left. Pt has to take sister to work. Pt was advised to stay and be seen but decided to leave.  ?

## 2021-08-12 ENCOUNTER — Ambulatory Visit (INDEPENDENT_AMBULATORY_CARE_PROVIDER_SITE_OTHER): Payer: Medicare Other | Admitting: Internal Medicine

## 2021-08-12 VITALS — BP 143/91 | HR 100 | Temp 98.0°F | Wt 162.2 lb

## 2021-08-12 DIAGNOSIS — R197 Diarrhea, unspecified: Secondary | ICD-10-CM | POA: Diagnosis present

## 2021-08-12 DIAGNOSIS — I10 Essential (primary) hypertension: Secondary | ICD-10-CM

## 2021-08-12 DIAGNOSIS — Z0000 Encounter for general adult medical examination without abnormal findings: Secondary | ICD-10-CM

## 2021-08-12 NOTE — Assessment & Plan Note (Addendum)
Patient states that he has been dealing with diarrhea for the past few months. He notes that he has not had any normal bowel movements in this time period and that he has diarrhea after every meal. He does not notice that his diarrhea gets worse with certain foods and has no history of food allergies/insensitivities. He denies any dietary changes recently and no one in the home has similar symptoms. He denies any recent travel. Patient also denies any fevers, chills, weight loss, abd pain, n/v, melena, or hematochezia.  ? ?Patient follows with GI, Dr. Alessandra Bevels, who did his colonoscopy back in 2021. He had a polyp removed but no other biopsies were taken as there was not a suspicion for IBD at this time. Suspicion for IBD remains low at this time given recent colonoscopy findings. Do not suspect patient has IBS as he does not have any constipation as well, nor does he have any abdominal pain. I also do not suspect patient has any food insensitivities (celiacs / lactose intolerance), as he notes that this occurs with every meal and he cannot attribute it to any certain food. Low suspicion for infectious etiology given chronicity and his history. Considered starting the patient on imodium for diarrhea, however, with his history of cirrhosis (and possible history of hepatic encephalopathy) we would not want to exacerbate this. ? ?Discussed case with Dr. Alessandra Bevels (GI) who advised that the patient can take imodium once daily. He would not recommend any more than once daily imodium, as this could increase his risk of hepatic encephalopathy. He recommended we obtain stool studies (order already placed) and he will follow up with the patient.  ? ?Plan: ?- Patient advised to make appt with GI ?- Continue rifxamin as per GI  ?- Can take imodium once daily  ?- Fecal elastase and fectal calprotectin ordered today ?- Stool osm, K, and Na ordered to calculate stool gap  ? ?

## 2021-08-12 NOTE — Patient Instructions (Signed)
Thank you, [name] for allowing [place] to provide your care today. Today we discussed: ? ?High blood pressure: Keep taking amlodipine once a day ? ?Diarrhea: We are checking stool studies. Please bring in a stool sample within a week so we can run these tests and figure out what could be causing your diarrhea. Please call Dr. Rosalie Gums (stomach doctor) at  [phone] to make an appointment to be seen  ? ?I have ordered the following labs for you: ? ? ?Lab Orders    ?     Calprotectin, Fecal    ?     Pancreatic Elastase, Fecal    ?     Osmolality, stool    ?     Sodium, stool    ?     Potassium, stool     ? ?Tests ordered today: ? ?none ? ?Referrals ordered today:  ? ?Referral Orders  ?No referral(s) requested today  ?  ? ?I have ordered the following medication/changed the following medications:  ? ?Stop the following medications: ?There are no discontinued medications.  ? ?Start the following medications: ?No orders of the defined types were placed in this encounter. ?  ? ?Follow up: 6 months for BP ? ? ?Remember: to make an appointment with Dr. Rosalie Gums, your gastroenterologist ? ?Should you have any questions or concerns please call the internal medicine clinic at [phone].   ? ? ?Buddy Duty, D.O. ?[place] ? ? ?

## 2021-08-12 NOTE — Assessment & Plan Note (Signed)
Counseled to get shingrix vaccine at local pharmacy  ?

## 2021-08-12 NOTE — Assessment & Plan Note (Signed)
BP 143/91 today. He follows with cardiology regularly and is on amlodipine 5 mg daily for his blood pressure. Will not make any changes to current regimen at this time. Patient has been previously advised by cardiology to hold his amlodipine if his SBP is less than 120.  ? ?Plan: ?- Continue amlodipine 5 mg daily ?- Continue to follow with cardiology ?

## 2021-08-12 NOTE — Progress Notes (Signed)
? ?CC: routine check up ? ?HPI: ? ?[name] is a 66 y.o. male with hypertension, CAD, alcoholic cirrhosis, and depression who presents to the [facility] for a routine visit. Please see problem-based list for further details, assessments, and plans. ? ? ?Past Medical History:  ?Diagnosis Date  ? Alcohol abuse   ? Alcohol use disorder, moderate, dependence ([place]) 10/24/2019  ? CAD in native artery 02/25/2021  ? Encephalopathy   ? Hyponatremia 10/24/2019  ? Pneumonia   ? Severe single current episode of major depressive disorder, without psychotic features ([place]) 01/26/2015  ? Suicidal thoughts   ? ?Review of Systems:  Review of Systems  ?Constitutional:  Negative for chills, fever and weight loss.  ?Respiratory:  Negative for cough and shortness of breath.   ?Cardiovascular:  Negative for chest pain and palpitations.  ?Gastrointestinal:  Positive for diarrhea. Negative for abdominal pain, blood in stool, constipation, heartburn, melena, nausea and vomiting.  ?Genitourinary:  Negative for dysuria and hematuria.  ?Musculoskeletal: Negative.   ?Neurological:  Negative for dizziness and headaches.  ? ? ?Physical Exam: ? ?Vitals:  ? 08/12/21 0854 08/12/21 0925  ?BP: (!) 146/90 (!) 143/91  ?Pulse: 100 100  ?Temp: 98 ?F (36.7 ?C)   ?TempSrc: Oral   ?SpO2: 100%   ?Weight: 162 lb 3.2 oz (73.6 kg)   ? ?General: Pleasant, well-appearing male. No acute distress. ?CV: RRR. No murmurs, rubs, or gallops. No LE edema ?Pulmonary: Lungs CTAB. Normal effort.  ?Abdominal: Soft, nontender, nondistended. Normal bowel sounds. ?Skin: Warm and dry.  ?Neuro: No focal deficit. ?Psych: Normal mood and affect ? ? ?Assessment & Plan:  ? ?See Encounters Tab for problem based charting. ? ?Patient discussed with Dr.  Saverio Danker ? ?Hypertension ?BP 143/91 today. He follows with cardiology regularly and is on amlodipine 5 mg daily for his blood pressure. Will not make any changes to current regimen at this time. Patient has been previously advised by  cardiology to hold his amlodipine if his SBP is less than 120.  ? ?Plan: ?- Continue amlodipine 5 mg daily ?- Continue to follow with cardiology ? ?Diarrhea ?Patient states that he has been dealing with diarrhea for the past few months. He notes that he has not had any normal bowel movements in this time period and that he has diarrhea after every meal. He does not notice that his diarrhea gets worse with certain foods and has no history of food allergies/insensitivities. He denies any dietary changes recently and no one in the home has similar symptoms. He denies any recent travel. Patient also denies any fevers, chills, weight loss, abd pain, n/v, melena, or hematochezia.  ? ?Patient follows with GI, Dr. Alessandra Bevels, who did his colonoscopy back in 2021. He had a polyp removed but no other biopsies were taken as there was not a suspicion for IBD at this time. Suspicion for IBD remains low at this time given recent colonoscopy findings. Do not suspect patient has IBS as he does not have any constipation as well, nor does he have any abdominal pain. I also do not suspect patient has any food insensitivities (celiacs / lactose intolerance), as he notes that this occurs with every meal and he cannot attribute it to any certain food. Low suspicion for infectious etiology given chronicity and his history. Considered starting the patient on imodium for diarrhea, however, with his history of cirrhosis (and possible history of hepatic encephalopathy) we would not want to exacerbate this. ? ?Discussed case with Dr. Alessandra Bevels (GI) who  advised that the patient can take imodium once daily. He would not recommend any more than once daily imodium, as this could increase his risk of hepatic encephalopathy. He recommended we obtain stool studies (order already placed) and he will follow up with the patient.  ? ?Plan: ?- Patient advised to make appt with GI ?- Continue rifxamin as per GI  ?- Can take imodium once daily  ?- Fecal  elastase and fectal calprotectin ordered today ?- Stool osm, K, and Na ordered to calculate stool gap  ? ? ?Healthcare maintenance ?Counseled to get shingrix vaccine at local pharmacy  ? ? ?

## 2021-08-13 NOTE — Addendum Note (Signed)
Addended byCharise Killian on: 08/13/2021 10:02 AM ? ? Modules accepted: Level of Service ? ?

## 2021-08-13 NOTE — Progress Notes (Addendum)
Internal Medicine Clinic Attending ? ?Case discussed with Dr. Raymondo Band at the time of the visit.  We reviewed the resident?s history and exam and pertinent patient test results.  I agree with the assessment, diagnosis, and plan of care documented in the resident?s note. Absence of abdominal pain making IBS-D less likely, further evaluation for alternative causes pending. ? ?

## 2021-10-24 ENCOUNTER — Other Ambulatory Visit: Payer: Self-pay

## 2021-10-24 MED ORDER — AMLODIPINE BESYLATE 5 MG PO TABS: 5.0000 mg | ORAL_TABLET | Freq: Every day | ORAL | 3 refills | Status: DC

## 2021-11-07 ENCOUNTER — Ambulatory Visit: Payer: Medicare Other

## 2021-11-08 ENCOUNTER — Other Ambulatory Visit: Payer: Self-pay

## 2021-11-08 ENCOUNTER — Emergency Department (HOSPITAL_COMMUNITY)
Admission: EM | Admit: 2021-11-08 | Discharge: 2021-11-08 | Disposition: A | Discharge status: Home / Self Care | Payer: Medicare Other | Attending: Emergency Medicine | Admitting: Emergency Medicine

## 2021-11-08 ENCOUNTER — Encounter (HOSPITAL_COMMUNITY): Payer: Self-pay | Admitting: Radiology

## 2021-11-08 DIAGNOSIS — I2510 Atherosclerotic heart disease of native coronary artery without angina pectoris: Secondary | ICD-10-CM | POA: Diagnosis not present

## 2021-11-08 DIAGNOSIS — K625 Hemorrhage of anus and rectum: Secondary | ICD-10-CM

## 2021-11-08 DIAGNOSIS — I10 Essential (primary) hypertension: Secondary | ICD-10-CM | POA: Diagnosis not present

## 2021-11-08 DIAGNOSIS — R7401 Elevation of levels of liver transaminase levels: Secondary | ICD-10-CM | POA: Insufficient documentation

## 2021-11-08 DIAGNOSIS — Z79899 Other long term (current) drug therapy: Secondary | ICD-10-CM | POA: Insufficient documentation

## 2021-11-08 DIAGNOSIS — D649 Anemia, unspecified: Secondary | ICD-10-CM | POA: Diagnosis not present

## 2021-11-08 DIAGNOSIS — R748 Abnormal levels of other serum enzymes: Secondary | ICD-10-CM

## 2021-11-08 LAB — COMPREHENSIVE METABOLIC PANEL
ALT: 46 U/L — ABNORMAL HIGH (ref 0–44)
AST: 32 U/L (ref 15–41)
Albumin: 3.6 g/dL (ref 3.5–5.0)
Alkaline Phosphatase: 139 U/L — ABNORMAL HIGH (ref 38–126)
Anion gap: 14 (ref 5–15)
BUN: 8 mg/dL (ref 8–23)
CO2: 24 mmol/L (ref 22–32)
Calcium: 9.3 mg/dL (ref 8.9–10.3)
Chloride: 106 mmol/L (ref 98–111)
Creatinine, Ser: 0.9 mg/dL (ref 0.61–1.24)
GFR, Estimated: >60 mL/min (ref >60)
Glucose, Bld: 124 mg/dL — ABNORMAL HIGH (ref 70–99)
Potassium: 3.5 mmol/L (ref 3.5–5.1)
Sodium: 144 mmol/L (ref 135–145)
Total Bilirubin: 0.5 mg/dL (ref 0.3–1.2)
Total Protein: 7 g/dL (ref 6.5–8.1)

## 2021-11-08 LAB — CBC
HCT: 30.6 % — ABNORMAL LOW (ref 39.0–52.0)
Hemoglobin: 10.4 g/dL — ABNORMAL LOW (ref 13.0–17.0)
MCH: 32.9 pg (ref 26.0–34.0)
MCHC: 34 g/dL (ref 30.0–36.0)
MCV: 96.8 fL (ref 80.0–100.0)
Platelets: 139 10*3/uL — ABNORMAL LOW (ref 150–400)
RBC: 3.16 MIL/uL — ABNORMAL LOW (ref 4.22–5.81)
RDW: 13.6 % (ref 11.5–15.5)
WBC: 5.8 10*3/uL (ref 4.0–10.5)
nRBC: 0 % (ref 0.0–0.2)

## 2021-11-08 LAB — PROTIME-INR
INR: 1.2 (ref 0.8–1.2)
Prothrombin Time: 15.1 seconds (ref 11.4–15.2)

## 2021-11-08 LAB — HEMOGLOBIN AND HEMATOCRIT, BLOOD
HCT: 32.3 % — ABNORMAL LOW (ref 39.0–52.0)
Hemoglobin: 11.1 g/dL — ABNORMAL LOW (ref 13.0–17.0)

## 2021-11-08 LAB — LACTIC ACID, PLASMA: Lactic Acid, Venous: 2.1 mmol/L (ref 0.5–1.9)

## 2021-11-08 LAB — POC OCCULT BLOOD, ED: Fecal Occult Bld: NEGATIVE

## 2021-11-08 LAB — TYPE AND SCREEN
ABO/RH(D): B POS
Antibody Screen: NEGATIVE

## 2021-11-08 MED ORDER — LACTATED RINGERS IV BOLUS
1000.0000 mL | Freq: Once | INTRAVENOUS | Status: AC
  Administered 2021-11-08: 1000 mL via INTRAVENOUS

## 2021-11-08 NOTE — ED Provider Notes (Signed)
[facility] EMERGENCY DEPARTMENT Provider Note   CSN: [id number] Arrival date & time: 11/08/21  0045     History  Chief Complaint  Patient presents with   Rectal Bleeding    Chase Parsons is a 66 y.o. male.  The history is provided by the patient.  Rectal Bleeding He has history of hypertension, alcohol abuse with cirrhosis, coronary artery disease and comes in because of rectal bleeding and a syncopal episode.  He states that yesterday, he started having blood when he moved his bowels.  He has gotten to the point where he is just passing blood without any stool.  He cannot tell me if the blood is bright red or dark red.  He has had similar episodes in the past.  He does admit to drinking 40 ounces of beer today and 40 ounces of beer yesterday, but in general he is a very poor historian.  He is feeling weak and lightheaded and did have a syncopal episode.  He is also complaining of some left lower abdominal pain which seems to improve following passing blood.  He denies any nausea or vomiting.   Home Medications Prior to Admission medications   Medication Sig Start Date End Date Taking? Authorizing Provider  amLODipine (NORVASC) 5 MG tablet Take 1 tablet (5 mg total) by mouth daily. 10/24/21   Dorethea Clan, DO  FLUoxetine (PROZAC) 10 MG capsule Take 1 capsule by mouth once daily 01/20/21   Iona Beard, MD  folic acid (FOLVITE) 1 MG tablet Take 1 tablet (1 mg total) by mouth daily. 01/01/21   Atway, Rayann N, DO  XIFAXAN 550 MG TABS tablet Take 1 tablet by mouth in the morning and at bedtime. 10/24/20   [provider]      Allergies    Patient has no known allergies.    Review of Systems   Review of Systems  Gastrointestinal:  Positive for hematochezia.  All other systems reviewed and are negative.   Physical Exam Updated Vital Signs BP 117/80   Pulse 95   Temp 98 F (36.7 C)   Resp 14   Ht '5\' 8"'$  (1.727 m)   Wt 72.6 kg   SpO2 96%   BMI  24.33 kg/m  Physical Exam Vitals and nursing note reviewed.   66 year old male, resting comfortably and in no acute distress. Vital signs are normal. Oxygen saturation is 96%, which is normal. Head is normocephalic and atraumatic. PERRLA, EOMI. Oropharynx is clear. Neck is nontender and supple without adenopathy or JVD. Back is nontender and there is no CVA tenderness. Lungs are clear without rales, wheezes, or rhonchi. Chest is nontender. Heart has regular rate and rhythm without murmur. Abdomen is soft, flat, with mild left lower quadrant tenderness.  There is no rebound or guarding. Rectal: Normal sphincter tone, small amount of bright red blood present, no stool present. Extremities have no cyanosis or edema, full range of motion is present. Skin is warm and dry without rash. Neurologic: Mental status is normal, cranial nerves are intact, moves all extremities equally.  ED Results / Procedures / Treatments   Labs (all labs ordered are listed, but only abnormal results are displayed) Labs Reviewed  COMPREHENSIVE METABOLIC PANEL - Abnormal; Notable for the following components:      Result Value   Glucose, Bld 124 (*)    ALT 46 (*)    Alkaline Phosphatase 139 (*)    All other components within normal limits  CBC -  Abnormal; Notable for the following components:   RBC 3.16 (*)    Hemoglobin 10.4 (*)    HCT 30.6 (*)    Platelets 139 (*)    All other components within normal limits  LACTIC ACID, PLASMA - Abnormal; Notable for the following components:   Lactic Acid, Venous 2.1 (*)    All other components within normal limits  HEMOGLOBIN AND HEMATOCRIT, BLOOD - Abnormal; Notable for the following components:   Hemoglobin 11.1 (*)    HCT 32.3 (*)    All other components within normal limits  PROTIME-INR  POC OCCULT BLOOD, ED  POC OCCULT BLOOD, ED  TYPE AND SCREEN    EKG EKG Interpretation  Date/Time:  Saturday November 08 2021 02:42:33 EDT Ventricular Rate:  91 PR  Interval:  168 QRS Duration: 92 QT Interval:  357 QTC Calculation: 440 R Axis:   62 Text Interpretation: Sinus rhythm Normal ECG When compared with ECG of 08/07/2021, No significant change was found Confirmed by Delora Fuel ([id number]) on 11/08/2021 2:49:56 AM  Procedures Procedures  Cardiac monitor shows normal sinus rhythm, per my interpretation.  Medications Ordered in ED Medications  lactated ringers bolus 1,000 mL (0 mLs Intravenous Stopped 11/08/21 0334)    ED Course/ Medical Decision Making/ A&P                           Medical Decision Making Amount and/or Complexity of Data Reviewed Labs: ordered.   Rectal bleeding with syncope.  Differential diagnosis includes diverticulosis, diverticulitis, AV malformation, hemorrhoids, colonic polyps, colon cancer.  I have reviewed his old records, and colonoscopy on 02/20/2020 showed presence of diverticulosis.  This is most likely the source of his bleeding.  On 08/26/2020 he was admitted for hypotension and bleeding which was found to be secondary to gastritis from alcohol abuse.  I have ordered laboratory work-up of CBC, comprehensive metabolic panel, lactic acid level.  I have also ordered an ECG because of syncope and I have ordered orthostatic vital signs.  I have reviewed and interpreted the ECG, and my interpretation is normal ECG.  Orthostatic vital signs showed no significant change in pulse or blood pressure.  I have reviewed and interpreted all of the laboratory tests, and my interpretation is mild elevation of ALT and alkaline phosphatase which is not felt to be clinically significant and unchanged from baseline.  Mild elevation of lactic acid but no evidence of sepsis.  Mild to moderate anemia with hemoglobin 10.4 compared with 12.5 on 08/07/2021.  Official report of stool Hemoccult was negative, but I did note what looks like a small amount of bright red blood on the card when I obtain the specimen.  I have ordered IV fluids and ordered  a repeat hemoglobin to see if there is evidence of ongoing bleeding.  I have reviewed and interpreted the repeat hemoglobin, and my interpretation is no evidence of ongoing bleeding.  Repeat hemoglobin is 11.1 compared with 10.4.  I considered hospital admission, but hemoglobin is stable and actually rising and he shows no signs of significant blood loss.  He is discharged and I have recommended follow-up with his primary care provider as soon as can be arranged, and with his gastroenterologist.  Return precautions discussed.  Final Clinical Impression(s) / ED Diagnoses Final diagnoses:  Rectal bleeding  Normochromic normocytic anemia  Elevated ALT measurement  Alkaline phosphatase elevation    Rx / DC Orders ED Discharge Orders  None         Delora Fuel, MD 16/10/96 [phone]

## 2021-11-08 NOTE — ED Triage Notes (Signed)
Pt states he has had more than his usual rectal bleeding. Pt is a heavy drinker and when he drinks a lot, he has a lot more rectal bleeding than usual.

## 2021-11-08 NOTE — Discharge Instructions (Addendum)
Your evaluation today does not show any sign that you have lost a significant amount of blood.  However, if the bleeding seems to be getting worse, you are welcome to return at any time.  Please do not drink any alcohol, because alcohol can lead to a number of conditions that can make bleeding worse.

## 2021-11-14 ENCOUNTER — Other Ambulatory Visit: Payer: Self-pay | Admitting: Gastroenterology

## 2021-11-14 DIAGNOSIS — K7469 Other cirrhosis of liver: Secondary | ICD-10-CM

## 2021-11-21 ENCOUNTER — Ambulatory Visit
Admission: RE | Admit: 2021-11-21 | Discharge: 2021-11-21 | Disposition: A | Discharge status: Home / Self Care | Payer: Medicare Other | Source: Ambulatory Visit | Attending: Gastroenterology | Admitting: Gastroenterology

## 2021-11-21 DIAGNOSIS — K7469 Other cirrhosis of liver: Secondary | ICD-10-CM

## 2021-11-28 NOTE — Addendum Note (Signed)
Addended by: Buddy Duty on: 11/28/2021 09:47 AM   Modules accepted: Orders

## 2022-01-14 ENCOUNTER — Other Ambulatory Visit: Payer: Self-pay | Admitting: Student

## 2022-01-14 DIAGNOSIS — F322 Major depressive disorder, single episode, severe without psychotic features: Secondary | ICD-10-CM

## 2022-01-16 ENCOUNTER — Ambulatory Visit: Payer: Medicare Other

## 2022-03-09 ENCOUNTER — Other Ambulatory Visit: Payer: Self-pay

## 2022-03-09 ENCOUNTER — Emergency Department (HOSPITAL_COMMUNITY)
Admission: EM | Admit: 2022-03-09 | Discharge: 2022-03-10 | Disposition: A | Discharge status: Home / Self Care | Payer: Medicare Other | Attending: Emergency Medicine | Admitting: Emergency Medicine

## 2022-03-09 DIAGNOSIS — Z79899 Other long term (current) drug therapy: Secondary | ICD-10-CM | POA: Diagnosis not present

## 2022-03-09 DIAGNOSIS — R944 Abnormal results of kidney function studies: Secondary | ICD-10-CM | POA: Diagnosis not present

## 2022-03-09 DIAGNOSIS — R569 Unspecified convulsions: Secondary | ICD-10-CM | POA: Insufficient documentation

## 2022-03-09 DIAGNOSIS — I10 Essential (primary) hypertension: Secondary | ICD-10-CM | POA: Diagnosis not present

## 2022-03-09 DIAGNOSIS — Z792 Long term (current) use of antibiotics: Secondary | ICD-10-CM | POA: Insufficient documentation

## 2022-03-09 DIAGNOSIS — R251 Tremor, unspecified: Secondary | ICD-10-CM | POA: Insufficient documentation

## 2022-03-09 DIAGNOSIS — I2510 Atherosclerotic heart disease of native coronary artery without angina pectoris: Secondary | ICD-10-CM | POA: Diagnosis not present

## 2022-03-09 DIAGNOSIS — R0789 Other chest pain: Secondary | ICD-10-CM | POA: Insufficient documentation

## 2022-03-09 DIAGNOSIS — R55 Syncope and collapse: Secondary | ICD-10-CM | POA: Diagnosis not present

## 2022-03-09 DIAGNOSIS — R000 Tachycardia, unspecified: Secondary | ICD-10-CM | POA: Diagnosis not present

## 2022-03-09 LAB — CBC WITH DIFFERENTIAL/PLATELET
Abs Immature Granulocytes: 0.04 10*3/uL (ref 0.00–0.07)
Basophils Absolute: 0 10*3/uL (ref 0.0–0.1)
Basophils Relative: 0 %
Eosinophils Absolute: 0 10*3/uL (ref 0.0–0.5)
Eosinophils Relative: 0 %
HCT: 35.7 % — ABNORMAL LOW (ref 39.0–52.0)
Hemoglobin: 12.1 g/dL — ABNORMAL LOW (ref 13.0–17.0)
Immature Granulocytes: 0 %
Lymphocytes Relative: 18 %
Lymphs Abs: 1.9 10*3/uL (ref 0.7–4.0)
MCH: 34.9 pg — ABNORMAL HIGH (ref 26.0–34.0)
MCHC: 33.9 g/dL (ref 30.0–36.0)
MCV: 102.9 fL — ABNORMAL HIGH (ref 80.0–100.0)
Monocytes Absolute: 1.3 10*3/uL — ABNORMAL HIGH (ref 0.1–1.0)
Monocytes Relative: 12 %
Neutro Abs: 7.1 10*3/uL (ref 1.7–7.7)
Neutrophils Relative %: 70 %
Platelets: 148 10*3/uL — ABNORMAL LOW (ref 150–400)
RBC: 3.47 MIL/uL — ABNORMAL LOW (ref 4.22–5.81)
RDW: 13.2 % (ref 11.5–15.5)
WBC: 10.5 10*3/uL (ref 4.0–10.5)
nRBC: 0 % (ref 0.0–0.2)

## 2022-03-09 LAB — COMPREHENSIVE METABOLIC PANEL
ALT: 41 U/L (ref 0–44)
AST: 51 U/L — ABNORMAL HIGH (ref 15–41)
Albumin: 4 g/dL (ref 3.5–5.0)
Alkaline Phosphatase: 77 U/L (ref 38–126)
Anion gap: 17 — ABNORMAL HIGH (ref 5–15)
BUN: 8 mg/dL (ref 8–23)
CO2: 22 mmol/L (ref 22–32)
Calcium: 9 mg/dL (ref 8.9–10.3)
Chloride: 101 mmol/L (ref 98–111)
Creatinine, Ser: 1.45 mg/dL — ABNORMAL HIGH (ref 0.61–1.24)
GFR, Estimated: 53 mL/min — ABNORMAL LOW (ref >60)
Glucose, Bld: 96 mg/dL (ref 70–99)
Potassium: 3.7 mmol/L (ref 3.5–5.1)
Sodium: 140 mmol/L (ref 135–145)
Total Bilirubin: 0.6 mg/dL (ref 0.3–1.2)
Total Protein: 8.3 g/dL — ABNORMAL HIGH (ref 6.5–8.1)

## 2022-03-09 NOTE — ED Provider Triage Note (Signed)
Emergency Medicine Provider Triage Evaluation Note  Alston Berrie , a 66 y.o. male  was evaluated in triage.  Pt complains of passing out.  Pt thinks he may have a seizure .  Review of Systems  Positive: seizure Negative: Fever or cough  Physical Exam  BP 117/74 (BP Location: Right Arm)   Pulse (!) 105   Temp 98.3 F (36.8 C) (Oral)   Resp 16   SpO2 97%  Gen:   Awake, no distress   Resp:  Normal effort  MSK:   Moves extremities without difficulty  Other:    Medical Decision Making  Medically screening exam initiated at 4:36 PM.  Appropriate orders placed.  Daruis Swaim was informed that the remainder of the evaluation will be completed by another provider, this initial triage assessment does not replace that evaluation, and the importance of remaining in the ED until their evaluation is complete.     Fransico Meadow, [place] 03/09/22 1637

## 2022-03-09 NOTE — ED Triage Notes (Signed)
Family reports patient was shaking and then fell on the ground. On FD arrival patient not post ictal. Patient endorses n/v/d over the last few days. Orthostatic changes with EMS. Hx alcohol abuse-has had beer today.

## 2022-03-10 ENCOUNTER — Emergency Department (HOSPITAL_COMMUNITY): Payer: Medicare Other

## 2022-03-10 ENCOUNTER — Encounter (HOSPITAL_COMMUNITY): Payer: Self-pay

## 2022-03-10 DIAGNOSIS — R569 Unspecified convulsions: Secondary | ICD-10-CM | POA: Diagnosis not present

## 2022-03-10 LAB — RAPID URINE DRUG SCREEN, HOSP PERFORMED
Amphetamines: NOT DETECTED
Barbiturates: NOT DETECTED
Benzodiazepines: NOT DETECTED
Cocaine: NOT DETECTED
Opiates: NOT DETECTED
Tetrahydrocannabinol: NOT DETECTED

## 2022-03-10 LAB — URINALYSIS, ROUTINE W REFLEX MICROSCOPIC
Glucose, UA: NEGATIVE mg/dL
Hgb urine dipstick: NEGATIVE
Ketones, ur: NEGATIVE mg/dL
Leukocytes,Ua: NEGATIVE
Nitrite: NEGATIVE
Protein, ur: 30 mg/dL — AB
Specific Gravity, Urine: >1.03 — ABNORMAL HIGH (ref 1.005–1.030)
pH: 5.5 (ref 5.0–8.0)

## 2022-03-10 LAB — URINALYSIS, MICROSCOPIC (REFLEX)
RBC / HPF: NONE SEEN RBC/hpf (ref 0–5)
Squamous Epithelial / HPF: NONE SEEN (ref 0–5)

## 2022-03-10 LAB — MAGNESIUM: Magnesium: 1.1 mg/dL — ABNORMAL LOW (ref 1.7–2.4)

## 2022-03-10 LAB — CBG MONITORING, ED: Glucose-Capillary: 146 mg/dL — ABNORMAL HIGH (ref 70–99)

## 2022-03-10 LAB — ETHANOL: Alcohol, Ethyl (B): <10 mg/dL (ref <10)

## 2022-03-10 MED ORDER — MAGNESIUM SULFATE 2 GM/50ML IV SOLN
2.0000 g | Freq: Once | INTRAVENOUS | Status: AC
  Administered 2022-03-10: 2 g via INTRAVENOUS
  Filled 2022-03-10: qty 50

## 2022-03-10 MED ORDER — MAGNESIUM OXIDE 400 MG PO CAPS: 400.0000 mg | ORAL_CAPSULE | Freq: Every day | ORAL | 0 refills | Status: AC

## 2022-03-10 MED ORDER — LACTATED RINGERS IV BOLUS
2000.0000 mL | Freq: Once | INTRAVENOUS | Status: AC
  Administered 2022-03-10: 2000 mL via INTRAVENOUS

## 2022-03-10 NOTE — ED Notes (Signed)
Pt A&O x4 at this time. Ambulated to restroom with a steady gait. Pt reports pain in central chest area. No visual signs of bruising. PT reports feeling dizzy prior to falling. Reports only had 1 beer prior to incident.

## 2022-03-10 NOTE — Discharge Instructions (Signed)
You were seen in the ED today for a syncopal episode. It seems most likely that you were dehydrated and passed out because of this. Your magnesium is low so I have prescribed you supplement to start taking.   Please return to the emergency department with worsening symptoms.

## 2022-03-10 NOTE — ED Notes (Signed)
Got patient into a gown on the monitor patient is resting with call bell in reach 

## 2022-03-10 NOTE — ED Notes (Signed)
Checked patient cbg it was 146 patient is resting with call bell in reach

## 2022-03-10 NOTE — ED Provider Notes (Signed)
[facility] EMERGENCY DEPARTMENT Provider Note   CSN: [id number] Arrival date & time: 03/09/22  1448     History PMH: ETOH use disorder, HTN, CAD, Cirrhosis  Chief Complaint  Patient presents with   Seizures    Chase Parsons is a 66 y.o. male.  Patient is presenting to the ED after having a syncopal episode versus seizure activity yesterday afternoon while working around the house.  He states he had had a sip of his beer at that time, when he started to feel dizzy as he was standing up and walking around.  He says he passed out this time.  His friend who is with him said that he might of had a seizure because he had some mild shaking.  Patient said when he woke up several minutes later he felt basically back to normal.  He had no confusion, weakness, numbness, or other symptoms.  He says he has had this happen in the past when he had been dehydrated where he had passed out inside of store.  He had not recently decreased his ETOH use amount. He reports drinking 1/2 beer daily. He had not ate or drank anything prior to passing out. He also reported having diarrhea for one day prior to symptoms starting.  He has no known history of seizures or alcohol withdrawal. He denies any pain anywhere including chest pain, headache, or abdominal pain also denies any shortness of breath, nausea, vomiting.      HPI     Home Medications Prior to Admission medications   Medication Sig Start Date End Date Taking? Authorizing Provider  Magnesium Oxide 400 MG CAPS Take 1 capsule (400 mg total) by mouth daily. 03/10/22 04/09/22 Yes Karsin Pesta, Adora Fridge, PA-C  amLODipine (NORVASC) 5 MG tablet Take 1 tablet (5 mg total) by mouth daily. 10/24/21   Atway, Rayann N, DO  FLUoxetine (PROZAC) 10 MG capsule Take 1 capsule by mouth once daily 01/14/22   Atway, Rayann N, DO  folic acid (FOLVITE) 1 MG tablet Take 1 tablet (1 mg total) by mouth daily. 01/01/21   Atway, Rayann N, DO  XIFAXAN 550 MG TABS  tablet Take 1 tablet by mouth in the morning and at bedtime. 10/24/20   [provider]      Allergies    Patient has no known allergies.    Review of Systems   Review of Systems  Gastrointestinal:  Positive for diarrhea.  Neurological:  Positive for dizziness and syncope.  All other systems reviewed and are negative.   Physical Exam Updated Vital Signs BP 130/88   Pulse 92   Temp 98.1 F (36.7 C)   Resp 17   SpO2 100%  Physical Exam Vitals and nursing note reviewed.  Constitutional:      General: He is not in acute distress.    Appearance: Normal appearance. He is well-developed. He is not ill-appearing, toxic-appearing or diaphoretic.  HENT:     Head: Normocephalic and atraumatic.     Nose: No nasal deformity.     Mouth/Throat:     Lips: Pink. No lesions.     Mouth: Mucous membranes are moist.     Pharynx: Oropharynx is clear. No oropharyngeal exudate or posterior oropharyngeal erythema.  Eyes:     General: Gaze aligned appropriately. No scleral icterus.       Right eye: No discharge.        Left eye: No discharge.     Extraocular Movements: Extraocular movements intact.  Conjunctiva/sclera: Conjunctivae normal.     Right eye: Right conjunctiva is not injected. No exudate or hemorrhage.    Left eye: Left conjunctiva is not injected. No exudate or hemorrhage.    Pupils: Pupils are equal, round, and reactive to light.  Cardiovascular:     Rate and Rhythm: Regular rhythm. Tachycardia present.     Pulses: Normal pulses.     Heart sounds: Normal heart sounds. No murmur heard.    No friction rub. No gallop.  Pulmonary:     Effort: Pulmonary effort is normal. No respiratory distress.     Breath sounds: Normal breath sounds. No stridor. No wheezing, rhonchi or rales.     Comments: Right sided chest wall tenderness Chest:     Chest wall: Tenderness present.  Abdominal:     General: Abdomen is flat. There is no distension.     Palpations: Abdomen is soft.  There is no mass.     Tenderness: There is no abdominal tenderness. There is no right CVA tenderness, left CVA tenderness, guarding or rebound.     Hernia: No hernia is present.  Musculoskeletal:     Right lower leg: No edema.     Left lower leg: No edema.  Skin:    General: Skin is warm and dry.     Coloration: Skin is not jaundiced.     Findings: No bruising or lesion.  Neurological:     General: No focal deficit present.     Mental Status: He is alert and oriented to person, place, and time.     Comments: 5/5 strength in all ext. Sensation intact in all ext. Gait normal and not ataxic.   Psychiatric:        Mood and Affect: Mood normal.        Speech: Speech normal.        Behavior: Behavior normal. Behavior is cooperative.     ED Results / Procedures / Treatments   Labs (all labs ordered are listed, but only abnormal results are displayed) Labs Reviewed  CBC WITH DIFFERENTIAL/PLATELET - Abnormal; Notable for the following components:      Result Value   RBC 3.47 (*)    Hemoglobin 12.1 (*)    HCT 35.7 (*)    MCV 102.9 (*)    MCH 34.9 (*)    Platelets 148 (*)    Monocytes Absolute 1.3 (*)    All other components within normal limits  COMPREHENSIVE METABOLIC PANEL - Abnormal; Notable for the following components:   Creatinine, Ser 1.45 (*)    Total Protein 8.3 (*)    AST 51 (*)    GFR, Estimated 53 (*)    Anion gap 17 (*)    All other components within normal limits  URINALYSIS, ROUTINE W REFLEX MICROSCOPIC - Abnormal; Notable for the following components:   APPearance HAZY (*)    Specific Gravity, Urine >1.030 (*)    Bilirubin Urine SMALL (*)    Protein, ur 30 (*)    All other components within normal limits  MAGNESIUM - Abnormal; Notable for the following components:   Magnesium 1.1 (*)    All other components within normal limits  URINALYSIS, MICROSCOPIC (REFLEX) - Abnormal; Notable for the following components:   Bacteria, UA RARE (*)    All other components  within normal limits  CBG MONITORING, ED - Abnormal; Notable for the following components:   Glucose-Capillary 146 (*)    All other components within normal limits  ETHANOL  RAPID URINE DRUG SCREEN, HOSP PERFORMED    EKG EKG Interpretation  Date/Time:  Tuesday March 10 2022 10:12:34 EST Ventricular Rate:  100 PR Interval:  152 QRS Duration: 94 QT Interval:  364 QTC Calculation: 470 R Axis:   50 Text Interpretation: Sinus tachycardia Low voltage, precordial leads Borderline T abnormalities, anterior leads Confirmed by Georgina Snell [phone]) on 03/10/2022 11:26:56 AM  Radiology DG Chest Port 1 View  Result Date: 03/10/2022 CLINICAL DATA:  66 year old male with syncope.  Possible seizure. EXAM: PORTABLE CHEST 1 VIEW COMPARISON:  Chest radiographs 08/07/2021 and earlier. FINDINGS: Portable AP upright view at 1000 hours. Mildly lower lung volumes. Mild chronic right hemidiaphragm elevation appears to be a normal variant. Normal cardiac size and mediastinal contours. Visualized tracheal air column is within normal limits. Allowing for portable technique the lungs are clear. No pneumothorax. Paucity of bowel gas in the upper abdomen. Chronic left clavicle fracture. No acute osseous abnormality identified. IMPRESSION: No acute cardiopulmonary abnormality. Electronically Signed   By: Genevie Ann M.D.   On: 03/10/2022 10:09    Procedures Procedures  This patient was on telemetry or cardiac monitoring during their time in the ED.   Medications Ordered in ED Medications  magnesium sulfate IVPB 2 g 50 mL (2 g Intravenous New Bag/Given 03/10/22 1215)  lactated ringers bolus 2,000 mL (0 mLs Intravenous Stopped 03/10/22 1151)    ED Course/ Medical Decision Making/ A&P                           Medical Decision Making Amount and/or Complexity of Data Reviewed Labs: ordered. Radiology: ordered.  Risk OTC drugs. Prescription drug management.    MDM  This is a 66 y.o. male who  presents to the ED with syncope versus seizure The differential of this patient includes but is not limited to Anemia, Hypoglycemia, Cardiac arrhythmia, Valvular disorder, AAA rupture, Aortic Dissection, PE, SAH, Orthostatic hypotension, Seizure, Vasovagal   Initial Impression  Patient is well appearing, no acute distress. HR 106, BP normal, other vitals normal, Orthostatics were initially positive when patient initially arrived in ED about 1630 in the afternoon yesterday. No focal neurological deficits on exam. He is perfusing well on exam.  I personally ordered, reviewed, and interpreted all laboratory work and imaging and agree with radiologist interpretation. Results interpreted below:  There is no leukocytosis, hemoglobin stable from baseline, CMP with minimally elevated creatinine to 1.45, LFTs are stable from baseline, magnesium found to be 1.1 which seems to be chronic ETOH level 0, UDS negative UA with increased spec grav, no UTI CXR with no acute process. EKG nonischemic or changes from prior. Qtc normal. No other arhythmia identified  Assessment/Plan:  Based on history and exam, I am more concerned about syncope, as he had no post ictal period and no history of seizures/ETOH withdrawal. He also has no clinical signs of ETOH withdrawal. I have extremely low suspicion for AAA, dissection, SAH, or PE. He is orthostatic +, having current diarrheal illness, and slightly tachycardic, and has mild AKI leading me to favor dehydration/orthostatic etiology. He has been given 2 L of IVF.  His magnesium was replaced with 2 g. His symptoms are resolved after fluids. I reviewed results with patient and do not feel admission is indicated at this time. He is stable for discharge. I have provided return precautions.    Charting Requirements Additional history is obtained from:  Independent historian External Records from outside source obtained  and reviewed including: prior admisison from 2022 for  hypotension Social Determinants of Health:  Alcoholism/Drug Addiction Pertinant PMH that complicates patient's illness: ETOH use, CAD  Patient Care Problems that were addressed during this visit: - Syncope: Acute illness with systemic symptoms This patient was maintained on a cardiac monitor/telemetry. I personally viewed and interpreted the cardiac monitor which reveals an underlying rhythm of NSR, ST Medications given in ED: 2 L LR, 2 g Mag sulfate Reevaluation of the patient after these medicines showed that the patient resolved I have reviewed home medications and made changes accordingly.  Critical Care Interventions: n/a Consultations: n/a Disposition: discharge  This is a supervised visit with my attending physician, Dr. Nechama Guard. We have discussed this patient and they have altered the plan as needed.  Portions of this note were generated with [employer]. Dictation errors may occur despite best attempts at proofreading.          Final Clinical Impression(s) / ED Diagnoses Final diagnoses:  Syncope, unspecified syncope type    Rx / DC Orders ED Discharge Orders          Ordered    Magnesium Oxide 400 MG CAPS  Daily        03/10/22 1307              Adolphus Birchwood, PA-C 03/10/22 1313    Elgie Congo, MD 03/12/22 [phone]

## 2022-05-12 ENCOUNTER — Encounter: Payer: Medicare Other | Admitting: Student

## 2022-05-12 NOTE — Progress Notes (Incomplete)
CC: ***  HPI:   [name] is a 67 y.o. with a history of hypertension, CAD, GERD, alcoholic cirrhosis, and MDD who presents for routine follow-up. He was last seen at [facility] in 07-2021.   Hypertension BP 143/91 today. He follows with cardiology regularly and is on amlodipine 5 mg daily for his blood pressure. Will not make any changes to current regimen at this time. Patient has been previously advised by cardiology to hold his amlodipine if his SBP is less than 120.  Plan: - Continue amlodipine 5 mg daily - Continue to follow with cardiology, last visit 02-2021, told to follow up in 73mobut never did  Xxxx   [place]coholic [place] He was congratulated on limiting his drinking.  Xxxx    CAD in native artery Asymptomatic.  Noted on CT scan.  He is not on aspirin given his history of GI bleed, gastritis, and esophageal varices.  No statin given his history of cirrhosis.  LDL was 91 on 06/2020.  Xxx     Diarrhea Patient states that he has been dealing with diarrhea for the past few months. He notes that he has not had any normal bowel movements in this time period and that he has diarrhea after every meal. He does not notice that his diarrhea gets worse with certain foods and has no history of food allergies/insensitivities. He denies any dietary changes recently and no one in the home has similar symptoms. He denies any recent travel. Patient also denies any fevers, chills, weight loss, abd pain, n/v, melena, or hematochezia.  Patient follows with GI, Dr. BAlessandra Bevels who did his colonoscopy back in 2021. He had a polyp removed but no other biopsies were taken as there was not a suspicion for IBD at this time. Suspicion for IBD remains low at this time given recent colonoscopy findings. Do not suspect patient has IBS as he does not have any constipation as well, nor does he have any abdominal pain. I also do not suspect patient has any food insensitivities (celiacs / lactose  intolerance), as he notes that this occurs with every meal and he cannot attribute it to any certain food. Low suspicion for infectious etiology given chronicity and his history. Considered starting the patient on imodium for diarrhea, however, with his history of cirrhosis (and possible history of hepatic encephalopathy) we would not want to exacerbate this. Discussed case with Dr. BAlessandra Bevels(GI) who advised that the patient can take imodium once daily. He would not recommend any more than once daily imodium, as this could increase his risk of hepatic encephalopathy. He recommended we obtain stool studies (order already placed) and he will follow up with the patient.  Plan: - Patient advised to make appt with GI - Continue rifxamin as per GI  - Can take imodium once daily  - Fecal elastase and fectal calprotectin ordered today - Stool osm, K, and Na ordered to calculate stool gap   xxxx   Past Medical History:  Diagnosis Date   Alcohol abuse    Alcohol use disorder, moderate, dependence [place] 10/24/[place]   CAD in native artery 02/25/2021   Encephalopathy    Hyponatremia 10/24/2019   Pneumonia    Severe single current episode of major depressive disorder, without psychotic features ([place] 01/26/2015   Suicidal thoughts      Review of Systems:    Reports *** Denies *** (subjective fever?, pain anywhere?, bowel changes?)   Physical Exam:  There were no vitals filed for  this visit.  General:   awake and alert, sitting comfortably in chair, cooperative, not in acute distress Skin:   warm and dry, intact without any obvious lesions or scars, no rashes or lesions  Head:   normocephalic and atraumatic, oral mucosa moist with good dentition, no lymphadenopathy Eyes:   extraocular movements intact, conjunctivae pink, pupils round and reactive to light, no periorbital swelling or scleral icterus Ears:   pinnae normal, no discharge or external lesions  Nose:   symmetrical and without mucosal  inflammation, no external lesions or discharge Lungs:   normal respiratory effort, breathing unlabored, symmetrical chest rise, no crackles or wheezing Cardiac:   regular rate and rhythm, normal S1 and S2, capillary refill 2-3 seconds, dorsalis pedis pulses intact bilaterally, no pitting edema Abdomen:   soft and non-distended, normoactive bowel sounds present in all four quadrants, no guarding or palpable masses Musculoskeletal:   full range of motion in joints, motor strength 5 /5 in all four extremities, no obvious deformities or joint tenderness Neurologic:   oriented to person-place-time, moving all extremities, sensation to light touch intact, no facial droop Psychiatric:   euthymic mood with congruent affect, intelligible speech    Assessment & Plan:   No problem-specific Assessment & Plan notes found for this encounter.     See Encounters Tab for problem based charting.  Patient {GC/GE:3044014::"discussed with","seen with"} Dr. {NAMES:[id number]::"Guilloud","Hoffman","Mullen","Narendra","Williams","Vincent"}

## 2022-05-13 ENCOUNTER — Inpatient Hospital Stay (HOSPITAL_COMMUNITY)
Admission: EM | Admit: 2022-05-13 | Discharge: 2022-05-15 | DRG: 917 | Disposition: A | Discharge status: Home / Self Care | Payer: Medicare Other | Attending: Internal Medicine | Admitting: Internal Medicine

## 2022-05-13 DIAGNOSIS — T512X1A Toxic effect of 2-Propanol, accidental (unintentional), initial encounter: Secondary | ICD-10-CM | POA: Diagnosis not present

## 2022-05-13 DIAGNOSIS — G9340 Encephalopathy, unspecified: Secondary | ICD-10-CM | POA: Diagnosis present

## 2022-05-13 DIAGNOSIS — Z811 Family history of alcohol abuse and dependence: Secondary | ICD-10-CM

## 2022-05-13 DIAGNOSIS — F10221 Alcohol dependence with intoxication delirium: Secondary | ICD-10-CM | POA: Diagnosis present

## 2022-05-13 DIAGNOSIS — K7460 Unspecified cirrhosis of liver: Secondary | ICD-10-CM | POA: Insufficient documentation

## 2022-05-13 DIAGNOSIS — F419 Anxiety disorder, unspecified: Secondary | ICD-10-CM | POA: Diagnosis present

## 2022-05-13 DIAGNOSIS — I10 Essential (primary) hypertension: Secondary | ICD-10-CM | POA: Diagnosis present

## 2022-05-13 DIAGNOSIS — R197 Diarrhea, unspecified: Secondary | ICD-10-CM | POA: Diagnosis present

## 2022-05-13 DIAGNOSIS — F10939 Alcohol use, unspecified with withdrawal, unspecified: Secondary | ICD-10-CM

## 2022-05-13 DIAGNOSIS — E876 Hypokalemia: Secondary | ICD-10-CM

## 2022-05-13 DIAGNOSIS — Z6372 Alcoholism and drug addiction in family: Secondary | ICD-10-CM

## 2022-05-13 DIAGNOSIS — K7030 Alcoholic cirrhosis of liver without ascites: Secondary | ICD-10-CM

## 2022-05-13 DIAGNOSIS — N179 Acute kidney failure, unspecified: Secondary | ICD-10-CM

## 2022-05-13 DIAGNOSIS — Z79899 Other long term (current) drug therapy: Secondary | ICD-10-CM

## 2022-05-13 DIAGNOSIS — G928 Other toxic encephalopathy: Secondary | ICD-10-CM | POA: Diagnosis present

## 2022-05-13 DIAGNOSIS — I2510 Atherosclerotic heart disease of native coronary artery without angina pectoris: Secondary | ICD-10-CM | POA: Diagnosis present

## 2022-05-13 DIAGNOSIS — K219 Gastro-esophageal reflux disease without esophagitis: Secondary | ICD-10-CM | POA: Diagnosis present

## 2022-05-13 DIAGNOSIS — E872 Acidosis, unspecified: Secondary | ICD-10-CM | POA: Diagnosis present

## 2022-05-13 DIAGNOSIS — F10239 Alcohol dependence with withdrawal, unspecified: Secondary | ICD-10-CM | POA: Diagnosis present

## 2022-05-13 LAB — CBC WITH DIFFERENTIAL/PLATELET
Abs Immature Granulocytes: 0.04 10*3/uL (ref 0.00–0.07)
Basophils Absolute: 0.1 10*3/uL (ref 0.0–0.1)
Basophils Relative: 1 %
Eosinophils Absolute: 0 10*3/uL (ref 0.0–0.5)
Eosinophils Relative: 0 %
HCT: 34.5 % — ABNORMAL LOW (ref 39.0–52.0)
Hemoglobin: 11.8 g/dL — ABNORMAL LOW (ref 13.0–17.0)
Immature Granulocytes: 1 %
Lymphocytes Relative: 36 %
Lymphs Abs: 3.2 10*3/uL (ref 0.7–4.0)
MCH: 33.5 pg (ref 26.0–34.0)
MCHC: 34.2 g/dL (ref 30.0–36.0)
MCV: 98 fL (ref 80.0–100.0)
Monocytes Absolute: 0.8 10*3/uL (ref 0.1–1.0)
Monocytes Relative: 9 %
Neutro Abs: 4.7 10*3/uL (ref 1.7–7.7)
Neutrophils Relative %: 53 %
Platelets: 202 10*3/uL (ref 150–400)
RBC: 3.52 MIL/uL — ABNORMAL LOW (ref 4.22–5.81)
RDW: 13.3 % (ref 11.5–15.5)
WBC: 8.8 10*3/uL (ref 4.0–10.5)
nRBC: 0 % (ref 0.0–0.2)

## 2022-05-13 LAB — COMPREHENSIVE METABOLIC PANEL
ALT: 32 U/L (ref 0–44)
AST: 42 U/L — ABNORMAL HIGH (ref 15–41)
Albumin: 4.5 g/dL (ref 3.5–5.0)
Alkaline Phosphatase: 69 U/L (ref 38–126)
Anion gap: 17 — ABNORMAL HIGH (ref 5–15)
BUN: 7 mg/dL — ABNORMAL LOW (ref 8–23)
CO2: 21 mmol/L — ABNORMAL LOW (ref 22–32)
Calcium: 8.9 mg/dL (ref 8.9–10.3)
Chloride: 105 mmol/L (ref 98–111)
Creatinine, Ser: 2.26 mg/dL — ABNORMAL HIGH (ref 0.61–1.24)
GFR, Estimated: 31 mL/min — ABNORMAL LOW (ref >60)
Glucose, Bld: 156 mg/dL — ABNORMAL HIGH (ref 70–99)
Potassium: 2.8 mmol/L — ABNORMAL LOW (ref 3.5–5.1)
Sodium: 143 mmol/L (ref 135–145)
Total Bilirubin: 1.1 mg/dL (ref 0.3–1.2)
Total Protein: 8.6 g/dL — ABNORMAL HIGH (ref 6.5–8.1)

## 2022-05-13 LAB — I-STAT VENOUS BLOOD GAS, ED
Acid-base deficit: 2 mmol/L (ref 0.0–2.0)
Bicarbonate: 21.4 mmol/L (ref 20.0–28.0)
Calcium, Ion: 1.03 mmol/L — ABNORMAL LOW (ref 1.15–1.40)
HCT: 37 % — ABNORMAL LOW (ref 39.0–52.0)
Hemoglobin: 12.6 g/dL — ABNORMAL LOW (ref 13.0–17.0)
O2 Saturation: 96 %
Potassium: 2.8 mmol/L — ABNORMAL LOW (ref 3.5–5.1)
Sodium: 144 mmol/L (ref 135–145)
TCO2: 22 mmol/L (ref 22–32)
pCO2, Ven: 30.1 mmHg — ABNORMAL LOW (ref 44–60)
pH, Ven: 7.459 — ABNORMAL HIGH (ref 7.25–7.43)
pO2, Ven: 77 mmHg — ABNORMAL HIGH (ref 32–45)

## 2022-05-13 LAB — I-STAT CHEM 8, ED
BUN: 6 mg/dL — ABNORMAL LOW (ref 8–23)
Calcium, Ion: 1.03 mmol/L — ABNORMAL LOW (ref 1.15–1.40)
Chloride: 106 mmol/L (ref 98–111)
Creatinine, Ser: 0.9 mg/dL (ref 0.61–1.24)
Glucose, Bld: 159 mg/dL — ABNORMAL HIGH (ref 70–99)
HCT: 38 % — ABNORMAL LOW (ref 39.0–52.0)
Hemoglobin: 12.9 g/dL — ABNORMAL LOW (ref 13.0–17.0)
Potassium: 2.9 mmol/L — ABNORMAL LOW (ref 3.5–5.1)
Sodium: 146 mmol/L — ABNORMAL HIGH (ref 135–145)
TCO2: 22 mmol/L (ref 22–32)

## 2022-05-13 LAB — LIPASE, BLOOD: Lipase: 26 U/L (ref 11–51)

## 2022-05-13 LAB — ETHANOL: Alcohol, Ethyl (B): <10 mg/dL (ref <10)

## 2022-05-13 LAB — SALICYLATE LEVEL: Salicylate Lvl: <7 mg/dL — ABNORMAL LOW (ref 7.0–30.0)

## 2022-05-13 MED ORDER — SODIUM CHLORIDE 0.9 % IV SOLN
1.0000 mg | Freq: Once | INTRAVENOUS | Status: AC
  Administered 2022-05-13: 1 mg via INTRAVENOUS
  Filled 2022-05-13: qty 0.2

## 2022-05-13 MED ORDER — POTASSIUM CHLORIDE 10 MEQ/100ML IV SOLN
10.0000 meq | INTRAVENOUS | Status: AC
  Administered 2022-05-14 (×2): 10 meq via INTRAVENOUS
  Filled 2022-05-13 (×2): qty 100

## 2022-05-13 MED ORDER — ACETAMINOPHEN 325 MG PO TABS: 325.0000 mg | ORAL_TABLET | Freq: Once | ORAL | Status: DC

## 2022-05-13 MED ORDER — LACTATED RINGERS IV BOLUS
1000.0000 mL | Freq: Once | INTRAVENOUS | Status: AC
  Administered 2022-05-14: 1000 mL via INTRAVENOUS

## 2022-05-13 MED ORDER — THIAMINE HCL 100 MG/ML IJ SOLN
100.0000 mg | Freq: Once | INTRAMUSCULAR | Status: AC
  Administered 2022-05-13: 100 mg via INTRAVENOUS
  Filled 2022-05-13: qty 2

## 2022-05-13 NOTE — ED Triage Notes (Signed)
Patient arrived with EMS from home reports drank approx. 750 ml of Rubbing Alcohol 70% this evening . CBG= 164.

## 2022-05-13 NOTE — H&P (Addendum)
Date: 05/14/2022               Patient Name:  Chase Parsons MRN: [id number]  DOB: 1955-07-03 Age / Sex: 67 y.o., male   PCP: Dorethea Clan, DO         Medical Service: Internal Medicine Teaching Service         Attending Physician: Dr. Aldine Contes, MD      First Contact: Dr. Nani Gasser, MD Pager [phone]    Second Contact: Dr. Buddy Duty, DO Pager [phone]         After Hours (After 5p/  First Contact Pager: [phone]  weekends / holidays): Second Contact Pager: [phone]   SUBJECTIVE   Chief Complaint: Altered Mental Status   History of Present Illness: This is a 67 year old male with a past medical history of hypertension, CAD, alcoholic liver cirrhosis, depression who presents to the emergency room for altered mental status.  Patient is unable to give history, and most history is obtained from patient's sister Eveangelene.  Patient lives with his sister and her husband as well as a nephew.  Today, patient was doing fine per sister in the morning, and got up to use the bathroom about 9 a.m.  At that time the patient was doing fine.  Patient's sister reports that at 2 PM, when she left the house, the patient was sleeping in bed.  When patient's sister returned home at 8:30 PM, she found patient sitting up in bed unable to answer any questions, with a rubbing alcohol bottle in his hand.  She was unsure if it was empty or not, and did not know how much was previously in the bottle.  Upon seeing this, patient sister called EMS.  Of note, patient does have a history of alcohol use disorder, and patient's sister states that the patient drinks about 1 to 2 quarts of beer a day with most recent drink about 3 days ago.  Usually, the sister does not allow the patient to drink and sister thinks that the patient may have obtained rubbing alcohol, to drink.  She notes that he has had no concerns over the past few days, and was doing fine.  Sister's husband is who manages the patient's  medications.  ED Course: Patient initially presented to the emergency room with vital signs showing temperature 97.7 F, respiratory rate 22, pulse 115, blood pressure 139/85 satting at 97% on room air.  Initial blood gas showed patient having pH of 7.45, and went up to 7.51.  Initial labs showed hemoglobin 11.8, potassium 2.8, creatinine 2.26, bicarb 21, undetectable ethyl alcohol level, undetectable salicylate level.  No imaging, and initial EKG showed sinus tachycardia with a rate of 114.  EDP called poison control who will call us back.  Patient was started on fluids and IMTS admitted for further evaluation and management.   Past Medical History Past Medical History:  Diagnosis Date   Alcohol abuse    Alcohol use disorder, moderate, dependence ([place]) 10/24/2019   CAD in native artery 02/25/2021   Encephalopathy    Hyponatremia 10/24/2019   Pneumonia    Severe single current episode of major depressive disorder, without psychotic features ([place]) 01/26/2015   Suicidal thoughts      Meds:  Amlodipine 5 mg daily Fluoxetine 10 mg daily Folic acid 1 mg daily Xifaxan 550 mg tablet twice daily Pantoprazole 40 mg daily   Past Surgical History  Past Surgical History:  Procedure Laterality Date   BIOPSY  02/20/2020   Procedure: BIOPSY;  Surgeon: Otis Brace, MD;  Location: [facility] ENDOSCOPY;  Service: Gastroenterology;;  EGD and COLON   COLONOSCOPY WITH PROPOFOL N/A 02/20/2020   Procedure: COLONOSCOPY WITH PROPOFOL;  Surgeon: Otis Brace, MD;  Location: [facility] ENDOSCOPY;  Service: Gastroenterology;  Laterality: N/A;   ESOPHAGOGASTRODUODENOSCOPY N/A 08/30/2020   Procedure: ESOPHAGOGASTRODUODENOSCOPY (EGD);  Surgeon: Otis Brace, MD;  [facility];  Service: Gastroenterology;  Laterality: N/A;   ESOPHAGOGASTRODUODENOSCOPY (EGD) WITH PROPOFOL N/A 02/20/2020   Procedure: ESOPHAGOGASTRODUODENOSCOPY (EGD) WITH PROPOFOL;  Surgeon: Otis Brace, MD;  Location: [facility] ENDOSCOPY;   Service: Gastroenterology;  Laterality: N/A;   POLYPECTOMY  02/20/2020   Procedure: POLYPECTOMY;  Surgeon: Otis Brace, MD;  Location: [facility] ENDOSCOPY;  Service: Gastroenterology;;    Social:  Lives With:Sister, Sister's husband, and Nephew Occupation: N/A Support: Able to make his own decisions at baseline  Level of Function: Independent of all ADLs and IADLs PCP: Dr. Buddy Duty Substances: 1 to 2 quarts of beer per day  Family History:  Father: Alcoholism  Allergies: Allergies as of 05/13/2022   (No Known Allergies)    Review of Systems: A complete ROS was negative except as per HPI.   OBJECTIVE:   Physical Exam: Blood pressure (!) 129/99, pulse (!) 114, temperature 97.7 F (36.5 C), temperature source Axillary, resp. rate 18, SpO2 99 %.  Constitutional: Resting in bed, confused and not appropriately answering questions  HENT: normocephalic atraumatic Eyes: conjunctiva erythematous Cardiovascular: regular rate and rhythm, no m/r/g Pulmonary/Chest: normal work of breathing on room air, lungs clear to auscultation bilaterally MSK: normal bulk and tone Neurological: alert & oriented x 2, Freely moving extremities. Not following directions  Psych: Confused   Labs: CBC    Component Value Date/Time   WBC 8.8 05/13/2022 2128   RBC 3.52 (L) 05/13/2022 2128   HGB 11.9 (L) 05/14/2022 0029   HGB 11.4 (L) 10/23/2020 1038   HGB 11.0 (L) 09/06/2020 1010   HCT 35.0 (L) 05/14/2022 0029   HCT 32.6 (L) 09/06/2020 1010   PLT 202 05/13/2022 2128   PLT 239 10/23/2020 1038   PLT 354 09/06/2020 1010   MCV 98.0 05/13/2022 2128   MCV 98 (H) 09/06/2020 1010   MCH 33.5 05/13/2022 2128   MCHC 34.2 05/13/2022 2128   RDW 13.3 05/13/2022 2128   RDW 13.1 09/06/2020 1010   LYMPHSABS 3.2 05/13/2022 2128   LYMPHSABS 2.1 02/29/2020 0938   MONOABS 0.8 05/13/2022 2128   EOSABS 0.0 05/13/2022 2128   EOSABS 0.1 02/29/2020 0938   BASOSABS 0.1 05/13/2022 2128   BASOSABS 0.0 02/29/2020  0938     CMP     Component Value Date/Time   NA 145 05/14/2022 0029   NA 138 09/06/2020 1010   K 3.2 (L) 05/14/2022 0029   CL 106 05/13/2022 2146   CO2 21 (L) 05/13/2022 2128   GLUCOSE 159 (H) 05/13/2022 2146   BUN 6 (L) 05/13/2022 2146   BUN 13 09/06/2020 1010   CREATININE 0.90 05/13/2022 2146   CREATININE 0.94 10/23/2020 1038   CALCIUM 8.9 05/13/2022 2128   PROT 8.6 (H) 05/13/2022 2128   PROT 8.5 09/06/2020 1010   ALBUMIN 4.5 05/13/2022 2128   ALBUMIN 4.7 09/06/2020 1010   AST 42 (H) 05/13/2022 2128   AST 47 (H) 10/23/2020 1038   ALT 32 05/13/2022 2128   ALT 43 10/23/2020 1038   ALKPHOS 69 05/13/2022 2128   BILITOT 1.1 05/13/2022 2128   BILITOT 1.0 10/23/2020 1038   GFRNONAA 31 (  L) 05/13/2022 2128   GFRNONAA >60 10/23/2020 1038   GFRAA 113 02/29/2020 0938    Imaging: N/A  EKG: personally reviewed my interpretation is Sinus tachy rate of 114, normal axis, with no acute ST segment changes noted. QTc was 624 ms. Previous EKG QTc was 470 ms, but otherwise no other changes from previous EKG.   ASSESSMENT & PLAN:   Assessment & Plan by Problem: Principal Problem:   Isopropyl alcohol poisoning   Lisle Skillman is a 67 y.o. male with a past medical history of hypertension, CAD, alcoholic liver cirrhosis, depression who presents to the emergency room for altered mental status. Patient admitted for further management and evaluation of acute encephalopathy likely in the setting of isopropyl alcohol ingestion   #Acute encephalopathy likely secondary to intoxication #Isopropyl alcohol poisoning Patient initially presented altered.  Given history, and bottle of rubbing alcohol on patient's hand, leading diagnosis is acute encephalopathy likely secondary to isopropyl alcohol poisoning.  Structural causes less likely given history and exam with no focal neurological deficits.  Less likely infectious in nature, as family states patient was not having any symptoms.  Patient is not  on any centrally acting meds that could cause his current state.  Metabolic concerns less likely given history of patient with rubbing alcohol bottle.  Electrolytes were slightly off with magnesium slightly low, and potassium slightly low.  On exam, patient is still altered, and not following directions.  Will await recommendations from poison control, and give supportive care.  Once patient's mental status comes back to baseline, will evaluate more. -Poison control recommending folic acid and thiamine, monitoring urine output, repeat EKG, and repeat labs.  -Poison control number [phone]  -Supportive care -Telemetry -Redirect patient as needed -Serum osm -BHA pending  -Folic Acid supplement  -Thiamine supplement  -Strict I's and O's   #Acute Kidney injury Patient presented with elevated creatinine at 2.26. This is likely pre-renal as paitnet has not had much to eat or drink in the last day or 2 per sister. Will start some maintenance fluids. -Maintenance fluids -Recheck BMP   #Respiratory Alkalosis  #AGMA with Metabolic alkalosis  Patient had a initial pH of 7.51 with pCO2 of 31. With a elevated resp rate in the 20s when he first came in this could be related. The AGMA could be related to ketones that could be a metabolite of acetone which is a metabolite of isopropyl alcohol. Patient change in AG was 5, and Bicarb should have been 19, but it was at 21, giving a concomitant metabolic alkalosis.  -Maintenance fluids  -Repeat BMP  -Checking lactic acid   #Hypokalemia #Hypomagnesemia  Patient's potassium on arrival was 2.8.  Patient magnesium 1.6. Will replete and recheck in a.m. -Replete and recheck  #Alcoholic liver cirrhosis #Alcohol use disorder #Elevated AST  Patient does have a past medical history of alcoholic liver cirrhosis.  Patient also has history of alcohol use disorder.  Per family, patient drinks about 1 to 2 quarts of beer per day.  Patient's last drink was 3 days  ago.  No current concern for withdrawal, but could be a concern.  Patient's liver cirrhosis is compensated.  Patient takes rifaximin daily. Patient previous was getting worked up for possible hemachromatosis, and had elevated ferritin levels.  -Ferritin levels, and iron panel pending   -Hold home rifaximin  -Giving Folic acid supplementation as well as thiamine supplementation  -CIWA   #QTc Prolongation Patient EKG showed prolonged Qtc likely in the setting of isopropyl alcohol  ingestion.  -Replete mag  -Repeat EKG    #Depression Patient has a past medical history of depression.  Patient takes fluoxetine 10 mg daily.  Given current mental state, hold home meds. -Hold home fluoxetine 10 mg daily  #Hypertension Patient current blood pressure is 133/82.  Patient does take home amlodipine 5 mg daily. -Hold home amlodipine  #GERD  Patient takes home pantoprazole 40 mg daily -Hold home pantoprazole   Diet: NPO VTE: Heparin IVF: LR,100cc/hr Code: Full  Prior to Admission Living Arrangement: Home, living with sister Anticipated Discharge Location: Home Barriers to Discharge: Clinical improvement   Dispo: Admit patient to Inpatient with expected length of stay greater than 2 midnights.  Signed: Leigh Aurora, DO Internal Medicine Resident PGY-1 Pager: [phone] 05/14/2022, 2:37 AM

## 2022-05-13 NOTE — ED Provider Notes (Signed)
[facility] EMERGENCY DEPARTMENT Provider Note   CSN: [id number] Arrival date & time: 05/13/22  2100     History  Chief Complaint  Patient presents with   Drank Rubbing Alcohol   Ingestion    Chase Parsons is a 67 y.o. male. He has history of alcohol abuse. He came in via EMS reporting that he had drank 750 mL of 70% rubbing alcohol tonight.  His sister called EMS, she states he would not tell her if she drank the alcohol or not but he did report to EMS that he drank rubbing alcohol.    Ingestion       Home Medications Prior to Admission medications   Medication Sig Start Date End Date Taking? Authorizing Provider  amLODipine (NORVASC) 5 MG tablet Take 1 tablet (5 mg total) by mouth daily. 10/24/21   Atway, Rayann N, DO  FLUoxetine (PROZAC) 10 MG capsule Take 1 capsule by mouth once daily 01/14/22   Atway, Rayann N, DO  folic acid (FOLVITE) 1 MG tablet Take 1 tablet (1 mg total) by mouth daily. 01/01/21   Atway, Rayann N, DO  XIFAXAN 550 MG TABS tablet Take 1 tablet by mouth in the morning and at bedtime. 10/24/20   [provider]      Allergies    Patient has no known allergies.    Review of Systems   Review of Systems  Reason unable to perform ROS: altered mental status.    Physical Exam Updated Vital Signs BP 133/82 (BP Location: Right Arm)   Pulse 100   Temp 97.7 F (36.5 C) (Axillary)   Resp (!) 22   SpO2 98%  Physical Exam Vitals and nursing note reviewed.  Constitutional:      General: He is not in acute distress.    Appearance: He is well-developed.  HENT:     Head: Normocephalic and atraumatic.  Eyes:     Conjunctiva/sclera: Conjunctivae normal.  Cardiovascular:     Rate and Rhythm: Normal rate and regular rhythm.     Heart sounds: No murmur heard. Pulmonary:     Effort: Pulmonary effort is normal. No respiratory distress.     Breath sounds: Normal breath sounds.  Abdominal:     Palpations: Abdomen is soft.      Tenderness: There is no abdominal tenderness.  Musculoskeletal:        General: No swelling.     Cervical back: Neck supple.  Skin:    General: Skin is warm and dry.     Capillary Refill: Capillary refill takes less than 2 seconds.  Neurological:     Mental Status: He is alert. He is confused.     GCS: GCS eye subscore is 4. GCS verbal subscore is 4. GCS motor subscore is 5.     Comments: Pt is oriented to self, answers all other questions " I might have"  Psychiatric:        Mood and Affect: Mood normal.     ED Results / Procedures / Treatments   Labs (all labs ordered are listed, but only abnormal results are displayed) Labs Reviewed  CBC WITH DIFFERENTIAL/PLATELET - Abnormal; Notable for the following components:      Result Value   RBC 3.52 (*)    Hemoglobin 11.8 (*)    HCT 34.5 (*)    All other components within normal limits  COMPREHENSIVE METABOLIC PANEL - Abnormal; Notable for the following components:   Potassium 2.8 (*)    CO2  21 (*)    Glucose, Bld 156 (*)    BUN 7 (*)    Creatinine, Ser 2.26 (*)    Total Protein 8.6 (*)    AST 42 (*)    GFR, Estimated 31 (*)    Anion gap 17 (*)    All other components within normal limits  SALICYLATE LEVEL - Abnormal; Notable for the following components:   Salicylate Lvl <4.7 (*)    All other components within normal limits  I-STAT CHEM 8, ED - Abnormal; Notable for the following components:   Sodium 146 (*)    Potassium 2.9 (*)    BUN 6 (*)    Glucose, Bld 159 (*)    Calcium, Ion 1.03 (*)    Hemoglobin 12.9 (*)    HCT 38.0 (*)    All other components within normal limits  I-STAT VENOUS BLOOD GAS, ED - Abnormal; Notable for the following components:   pH, Ven 7.459 (*)    pCO2, Ven 30.1 (*)    pO2, Ven 77 (*)    Potassium 2.8 (*)    Calcium, Ion 1.03 (*)    HCT 37.0 (*)    Hemoglobin 12.6 (*)    All other components within normal limits  I-STAT VENOUS BLOOD GAS, ED - Abnormal; Notable for the following  components:   pH, Ven 7.512 (*)    pCO2, Ven 31.7 (*)    pO2, Ven 132 (*)    Acid-Base Excess 3.0 (*)    Potassium 3.2 (*)    Calcium, Ion 1.04 (*)    HCT 35.0 (*)    Hemoglobin 11.9 (*)    All other components within normal limits  LIPASE, BLOOD  ETHANOL  URINALYSIS, ROUTINE W REFLEX MICROSCOPIC  VOLATILES,BLD-ACETONE,ETHANOL,ISOPROP,METHANOL  MAGNESIUM  OSMOLALITY  ACETAMINOPHEN LEVEL  BETA-HYDROXYBUTYRIC ACID  HIV ANTIBODY (ROUTINE TESTING W REFLEX)  COMPREHENSIVE METABOLIC PANEL  CBC  PROTIME-INR    EKG EKG Interpretation  Date/Time:  Wednesday May 13 2022 21:51:25 EST Ventricular Rate:  117 PR Interval:  80 QRS Duration: 84 QT Interval:  448 QTC Calculation: 624 R Axis:   65 Text Interpretation: Sinus tachycardia with short PR Nonspecific ST abnormality new Prolonged QT When compared with ECG of 10-Mar-2022 10:12, PREVIOUS ECG IS PRESENT Confirmed by Blanchie Dessert [phone]) on 05/13/2022 10:01:47 PM  Radiology No results found.  Procedures Procedures    Medications Ordered in ED Medications  potassium chloride 10 mEq in 100 mL IVPB (10 mEq Intravenous New Bag/Given 05/14/22 0012)  enoxaparin (LOVENOX) injection 30 mg (has no administration in time range)  thiamine (VITAMIN B1) injection 100 mg (100 mg Intravenous Given 06/09/06 6578)  folic acid 1 mg in sodium chloride 0.9 % 50 mL IVPB (0 mg Intravenous Stopped 05/13/22 2358)  lactated ringers bolus 1,000 mL (1,000 mLs Intravenous New Bag/Given 05/14/22 0012)    ED Course/ Medical Decision Making/ A&P Clinical Course as of 05/14/22 0039  Wed May 13, 2022  2153 ED EKG [CB]    Clinical Course User Index [CB] Gwenevere Abbot, PA-C                             Medical Decision Making This patient presents to the ED for concern of isopropyl alcohol ingestion, this involves an extensive number of treatment options, and is a complaint that carries with it a high risk of complications and morbidity.  The  differential diagnosis includes intoxication, alcohol abuse, toxic  alcohol ingestion, other   Co morbidities that complicate the patient evaluation  Chronic alcohol abuse, pretension, cirrhosis   Additional history obtained:  Additional history obtained from EMR External records from outside source obtained and reviewed including outpatient cardiology note   Lab Tests:  I Ordered, and personally interpreted labs.  The pertinent results include: VBG shows mild alkalosis, potassium is 2.9 on CMP,      Cardiac Monitoring: / EKG: Sinus rhythm with  tachycardia   Consultations Obtained:  I requested consultation with the [facility],  and discussed lab and imaging findings as well as pertinent plan - they recommend: Retain labs, supportive care, give thiamine and folate and they will follow-up in several hours  Hospitalist.  Patient has elevated creatinine, still tachycardic and confused from his likely toxic alcohol ingestion.  They are agreeable with admission.  I called poison control and gave their phone number so they can follow-up directly with the admitting team   Problem List / ED Course / Critical interventions / Medication management  Isopropyl alcohol ingestion-poison control called, supportive care is ongoing AKI-giving fluids, plan to admit  I ordered medication including LR  for tachycardia, AKI  Reevaluation of the patient after these medicines showed that the patient stayed the same I have reviewed the patients home medicines and have made adjustments as needed   Social Determinants of Health:  Pt has alcohol abuse       Amount and/or Complexity of Data Reviewed Labs: ordered. ECG/medicine tests:  Decision-making details documented in ED Course.  Risk Prescription drug management. Decision regarding hospitalization.  Isopropyl alcohol ingestion-Poison control was contacted.         Final Clinical Impression(s) / ED  Diagnoses Final diagnoses:  Isopropyl alcohol poisoning  Hypokalemia    Rx / DC Orders ED Discharge Orders     None         Gwenevere Abbot, PA-C 05/14/22 4825    Blanchie Dessert, MD 05/14/22 1825

## 2022-05-14 ENCOUNTER — Other Ambulatory Visit: Payer: Self-pay

## 2022-05-14 DIAGNOSIS — F10939 Alcohol use, unspecified with withdrawal, unspecified: Secondary | ICD-10-CM

## 2022-05-14 DIAGNOSIS — G928 Other toxic encephalopathy: Secondary | ICD-10-CM

## 2022-05-14 DIAGNOSIS — F419 Anxiety disorder, unspecified: Secondary | ICD-10-CM | POA: Diagnosis present

## 2022-05-14 DIAGNOSIS — T512X1A Toxic effect of 2-Propanol, accidental (unintentional), initial encounter: Secondary | ICD-10-CM | POA: Diagnosis not present

## 2022-05-14 DIAGNOSIS — F10239 Alcohol dependence with withdrawal, unspecified: Secondary | ICD-10-CM

## 2022-05-14 DIAGNOSIS — K7460 Unspecified cirrhosis of liver: Secondary | ICD-10-CM | POA: Insufficient documentation

## 2022-05-14 DIAGNOSIS — Z811 Family history of alcohol abuse and dependence: Secondary | ICD-10-CM | POA: Diagnosis not present

## 2022-05-14 DIAGNOSIS — N179 Acute kidney failure, unspecified: Secondary | ICD-10-CM

## 2022-05-14 DIAGNOSIS — F10221 Alcohol dependence with intoxication delirium: Secondary | ICD-10-CM | POA: Diagnosis present

## 2022-05-14 DIAGNOSIS — Z79899 Other long term (current) drug therapy: Secondary | ICD-10-CM | POA: Diagnosis not present

## 2022-05-14 DIAGNOSIS — E876 Hypokalemia: Secondary | ICD-10-CM | POA: Diagnosis present

## 2022-05-14 DIAGNOSIS — K7030 Alcoholic cirrhosis of liver without ascites: Secondary | ICD-10-CM | POA: Diagnosis present

## 2022-05-14 DIAGNOSIS — I2510 Atherosclerotic heart disease of native coronary artery without angina pectoris: Secondary | ICD-10-CM | POA: Diagnosis present

## 2022-05-14 DIAGNOSIS — Z6372 Alcoholism and drug addiction in family: Secondary | ICD-10-CM | POA: Diagnosis not present

## 2022-05-14 DIAGNOSIS — R197 Diarrhea, unspecified: Secondary | ICD-10-CM | POA: Diagnosis present

## 2022-05-14 DIAGNOSIS — K219 Gastro-esophageal reflux disease without esophagitis: Secondary | ICD-10-CM | POA: Diagnosis present

## 2022-05-14 DIAGNOSIS — E872 Acidosis, unspecified: Secondary | ICD-10-CM | POA: Diagnosis present

## 2022-05-14 DIAGNOSIS — I10 Essential (primary) hypertension: Secondary | ICD-10-CM | POA: Diagnosis present

## 2022-05-14 LAB — VOLATILES,BLD-ACETONE,ETHANOL,ISOPROP,METHANOL
Acetone, blood: 0.212 g/dL — ABNORMAL HIGH (ref 0.000–0.010)
Ethanol, blood: <0.01 g/dL (ref 0.000–0.010)
Isopropanol, blood: 0.065 g/dL — ABNORMAL HIGH (ref 0.000–0.010)
Methanol, blood: <0.01 g/dL (ref 0.000–0.010)

## 2022-05-14 LAB — I-STAT VENOUS BLOOD GAS, ED
Acid-Base Excess: 3 mmol/L — ABNORMAL HIGH (ref 0.0–2.0)
Bicarbonate: 25.4 mmol/L (ref 20.0–28.0)
Calcium, Ion: 1.04 mmol/L — ABNORMAL LOW (ref 1.15–1.40)
HCT: 35 % — ABNORMAL LOW (ref 39.0–52.0)
Hemoglobin: 11.9 g/dL — ABNORMAL LOW (ref 13.0–17.0)
O2 Saturation: 99 %
Potassium: 3.2 mmol/L — ABNORMAL LOW (ref 3.5–5.1)
Sodium: 145 mmol/L (ref 135–145)
TCO2: 26 mmol/L (ref 22–32)
pCO2, Ven: 31.7 mmHg — ABNORMAL LOW (ref 44–60)
pH, Ven: 7.512 — ABNORMAL HIGH (ref 7.25–7.43)
pO2, Ven: 132 mmHg — ABNORMAL HIGH (ref 32–45)

## 2022-05-14 LAB — IRON AND TIBC
Iron: 157 ug/dL (ref 45–182)
Saturation Ratios: 52 % — ABNORMAL HIGH (ref 17.9–39.5)
TIBC: 300 ug/dL (ref 250–450)
UIBC: 143 ug/dL

## 2022-05-14 LAB — CBC
HCT: 28.5 % — ABNORMAL LOW (ref 39.0–52.0)
Hemoglobin: 9.8 g/dL — ABNORMAL LOW (ref 13.0–17.0)
MCH: 33.8 pg (ref 26.0–34.0)
MCHC: 34.4 g/dL (ref 30.0–36.0)
MCV: 98.3 fL (ref 80.0–100.0)
Platelets: 163 10*3/uL (ref 150–400)
RBC: 2.9 MIL/uL — ABNORMAL LOW (ref 4.22–5.81)
RDW: 13.4 % (ref 11.5–15.5)
WBC: 8.8 10*3/uL (ref 4.0–10.5)
nRBC: 0 % (ref 0.0–0.2)

## 2022-05-14 LAB — COMPREHENSIVE METABOLIC PANEL
ALT: 28 U/L (ref 0–44)
AST: 37 U/L (ref 15–41)
Albumin: 3.9 g/dL (ref 3.5–5.0)
Alkaline Phosphatase: 62 U/L (ref 38–126)
Anion gap: 10 (ref 5–15)
BUN: 6 mg/dL — ABNORMAL LOW (ref 8–23)
CO2: 23 mmol/L (ref 22–32)
Calcium: 8.5 mg/dL — ABNORMAL LOW (ref 8.9–10.3)
Chloride: 106 mmol/L (ref 98–111)
Creatinine, Ser: 1.94 mg/dL — ABNORMAL HIGH (ref 0.61–1.24)
GFR, Estimated: 37 mL/min — ABNORMAL LOW (ref >60)
Glucose, Bld: 122 mg/dL — ABNORMAL HIGH (ref 70–99)
Potassium: 4.2 mmol/L (ref 3.5–5.1)
Sodium: 139 mmol/L (ref 135–145)
Total Bilirubin: 1.3 mg/dL — ABNORMAL HIGH (ref 0.3–1.2)
Total Protein: 7.4 g/dL (ref 6.5–8.1)

## 2022-05-14 LAB — FERRITIN: Ferritin: 518 ng/mL — ABNORMAL HIGH (ref 24–336)

## 2022-05-14 LAB — LACTIC ACID, PLASMA: Lactic Acid, Venous: 1.8 mmol/L (ref 0.5–1.9)

## 2022-05-14 LAB — ACETAMINOPHEN LEVEL: Acetaminophen (Tylenol), Serum: <10 ug/mL — ABNORMAL LOW (ref 10–30)

## 2022-05-14 LAB — PROTIME-INR
INR: 1.4 — ABNORMAL HIGH (ref 0.8–1.2)
Prothrombin Time: 17.1 seconds — ABNORMAL HIGH (ref 11.4–15.2)

## 2022-05-14 LAB — GLUCOSE, CAPILLARY: Glucose-Capillary: 110 mg/dL — ABNORMAL HIGH (ref 70–99)

## 2022-05-14 LAB — OSMOLALITY: Osmolality: 350 mOsm/kg (ref 275–295)

## 2022-05-14 LAB — HIV ANTIBODY (ROUTINE TESTING W REFLEX): HIV Screen 4th Generation wRfx: NONREACTIVE

## 2022-05-14 LAB — BETA-HYDROXYBUTYRIC ACID: Beta-Hydroxybutyric Acid: 1.4 mmol/L — ABNORMAL HIGH (ref 0.05–0.27)

## 2022-05-14 LAB — MAGNESIUM: Magnesium: 1.6 mg/dL — ABNORMAL LOW (ref 1.7–2.4)

## 2022-05-14 MED ORDER — FOLIC ACID 1 MG PO TABS
1.0000 mg | ORAL_TABLET | Freq: Every day | ORAL | Status: DC
  Administered 2022-05-14 – 2022-05-15 (×2): 1 mg via ORAL
  Filled 2022-05-14 (×2): qty 1

## 2022-05-14 MED ORDER — LACTATED RINGERS IV SOLN: INTRAVENOUS | Status: AC

## 2022-05-14 MED ORDER — LORAZEPAM 2 MG/ML IJ SOLN
INTRAMUSCULAR | Status: AC
  Administered 2022-05-14: 1 mg
  Filled 2022-05-14: qty 1

## 2022-05-14 MED ORDER — ACETAMINOPHEN 325 MG PO TABS
650.0000 mg | ORAL_TABLET | Freq: Four times a day (QID) | ORAL | Status: DC | PRN
  Administered 2022-05-14 (×2): 650 mg via ORAL
  Filled 2022-05-14: qty 2

## 2022-05-14 MED ORDER — THIAMINE MONONITRATE 100 MG PO TABS
100.0000 mg | ORAL_TABLET | Freq: Every day | ORAL | Status: DC
  Administered 2022-05-14 – 2022-05-15 (×2): 100 mg via ORAL
  Filled 2022-05-14 (×2): qty 1

## 2022-05-14 MED ORDER — LORAZEPAM 2 MG/ML IJ SOLN
1.0000 mg | INTRAMUSCULAR | Status: DC | PRN
  Administered 2022-05-15: 2 mg via INTRAVENOUS
  Filled 2022-05-14: qty 1

## 2022-05-14 MED ORDER — THIAMINE HCL 100 MG/ML IJ SOLN: 100.0000 mg | Freq: Every day | INTRAMUSCULAR | Status: DC

## 2022-05-14 MED ORDER — LORAZEPAM 1 MG PO TABS: 1.0000 mg | ORAL_TABLET | ORAL | Status: DC | PRN

## 2022-05-14 MED ORDER — ADULT MULTIVITAMIN W/MINERALS CH
1.0000 | ORAL_TABLET | Freq: Every day | ORAL | Status: DC
  Administered 2022-05-14 – 2022-05-15 (×2): 1 via ORAL
  Filled 2022-05-14 (×2): qty 1

## 2022-05-14 MED ORDER — LORAZEPAM 1 MG PO TABS: 1.0000 mg | ORAL_TABLET | Freq: Once | ORAL | Status: DC

## 2022-05-14 MED ORDER — POTASSIUM CHLORIDE 10 MEQ/100ML IV SOLN
10.0000 meq | INTRAVENOUS | Status: AC
  Administered 2022-05-14 (×2): 10 meq via INTRAVENOUS
  Filled 2022-05-14 (×2): qty 100

## 2022-05-14 MED ORDER — ENOXAPARIN SODIUM 30 MG/0.3ML IJ SOSY
30.0000 mg | PREFILLED_SYRINGE | INTRAMUSCULAR | Status: DC
  Administered 2022-05-14 – 2022-05-15 (×2): 30 mg via SUBCUTANEOUS
  Filled 2022-05-14 (×2): qty 0.3

## 2022-05-14 MED ORDER — MAGNESIUM SULFATE 2 GM/50ML IV SOLN
2.0000 g | Freq: Once | INTRAVENOUS | Status: AC
  Administered 2022-05-14: 2 g via INTRAVENOUS
  Filled 2022-05-14: qty 50

## 2022-05-14 MED ORDER — RIFAXIMIN 550 MG PO TABS
550.0000 mg | ORAL_TABLET | Freq: Two times a day (BID) | ORAL | Status: DC
  Administered 2022-05-14 – 2022-05-15 (×2): 550 mg via ORAL
  Filled 2022-05-14 (×3): qty 1

## 2022-05-14 NOTE — Discharge Instructions (Addendum)
Outpatient Providers   [facility] ([facility]) Group and individual counseling. [address]  [place], [place] [address] [phone] [place]: [phone]  [place]: [phone] Medicaid and uninsured.   The [place] [facility] groups multiple times per week. [place], [place], [place] [address] [phone] Takes Medicaid and other insurances.   [facility]  Chemical Dependency Intensive Outpatient Program (IOP) [address] [place], [place] [address] [phone] Takes Pharmacist, community and [place].   [facility]  IOP and Partial Hospitalization Program  [address]  [place], [place] [address] [phone] Private Insurance, [place] only for partial hospitalization     [facility] ([place]) IOP, individual counseling, medication management [place], [place] [address] [phone] Medicaid and [facility]  [place] [address]  [place], [place] [address] [phone] Private Insurance and [place] [facility] [address]  [place], [place] [address] [phone] Private Insurance, [place], and Self Pay   [facility]adone Clinic  [place], [place] [address] [facility]  [address]  [place], [place] [address] [phone]  [facility]  [address] [place], [place] [address] [phone]      Residential Treatment Programs  [facility] [place], [place] [address] [phone] or [phone] Detox and Residential Rehab 14 days (Medicare, Medicaid, private insurance, and self pay)  [facility] [place], [place] [address] [phone] Detox (self Pay and Medicaid Limited availability) Rehab Only Male (Medicare, [place], and  Self Pay)  [facility] [address] [place], [place] [address] [phone] or [phone] Private Insurance only  [facility]  [place]  [place], [place] [address] [phone] Treatment Only, must make assessment appointment, and must be sober for assessment appointment. Self pay, Medicare A and B, [facility], must be [facility] resident.      Residential Treatment [facility] [place] , [place]  [phone] [employer], [place], [place]. They offer assistance with transportation.   [facility] [address] [place],  [place], [place] [address] [phone] [facility] No insurance     [facility] [place], [place] [address] [phone] No pending legal charges, Long-term work program  [place]: [facility]  [place], [place] [address] [address] [place], [place] [address] Residential treatment (takes people on Methadone/Suboxone)  Medicaid and uninsured  [facility]  [address], [place], [place] [address] [phone] or [phone] [employer] Insurance Only [place] [phone] Private Insurance Males/Females, call to make referrals, multiple facilities   [facility] [address]  [place], [place] [address]  [phone] Men Only Upfront [facility] [address]      [facility]: [facility] [place], [place] [address] [phone]  [place], [place] [address] [phone] (f)  [place],  [address] [phone] (f)       Syringe Services Program Due to COVID-19, syringe services programs are likely operating under different hours with  limited  or no fixed site hours. Some programs may not be operating at all. Please contact the program directly using the phone numbers provided below to see if they are still operating under COVID-19.  [facility] Solution to the Opioid Problem (GCSTOP) Fixed; mobile; peer-based [employer] [phone] [email or web address] Fixed site exchange at [facility], [place]. [place], [place] [address] on Wednesdays (2:00 - 5:00 pm) and Thursdays (4:00 - 8:00 pm). Pop-up mobile exchange locations: [employer] and [employer], [address] [place], [place] [address] on Tuesdays (11:00 am - 1:00 pm) and Fridays (11:00 am - 1:00 pm) [address], [place], [place] [address] on Tuesdays (2:00 - 4:00 pm) and Fridays (2:00 - 4:00 pm) [facility] - also serves [place] and [employer] [place] [employer] Fixed; mobile; peer-based Rosemary Holms [phone] [email or web address] [address] [place], [place] [address] Delivery and outreach available in [place] and [place], please call for more information. Monday, Tuesday: 1:00 -7:00 pm Thursday: 4:00 pm - 8:00 pm Friday: 1:00 pm - 8:00 pm)   Psychiatry and [place], [place] [phone]  Psychiatrists [facility]    [facility] [address]    [address] [place], [place] [address]     [place], [place] [address] [phone]      [phone]  Dr. Norma Fredrickson     [facility] [address]    [place] [address]     [place] [address] [phone]      [phone]  Sheralyn Boatman, [place] [address]     [place] [address]     [place] [address] [phone]        [phone]  Therapists [facility]    [facility] [address] [place], [place]      [phone]  [facility]  [facility] [address]     [address] [place] [address]     [place], [place]      [phone]   [facility]    [facility] [address]    [address], [place], [place] [address]     [place], [place] [address] [phone]      [phone]  [facility]   [facility] [address]     [place],  [address]     [place],  [address] [phone]      [phone]  You were admitted for ingestion of rubbing alcohol and treated for the toxic effects of that.  You were also evaluated and treated for alcohol withdrawal syndrome.  We are discharging you now that your condition has improved.  Talk to your doctor about alcohol use disorder and cutting back on your alcohol use if you are interested.  There are several ways that your doctor can help you, including with medicine.  Return to the hospital if you experience signs of severe alcohol withdrawal including, but not limited to: Hallucinations, seizures, unremitting nausea and vomiting, confusion, fevers, sweating, fast heart rate.  Nani Gasser MD  05/15/2022, 11:30 AM

## 2022-05-14 NOTE — ED Notes (Signed)
Poison control called and given pt update.

## 2022-05-14 NOTE — Progress Notes (Signed)
Interval history Reports a headache.  He much wants to go home.  Says that he would like to quit drinking, and potentially withdrawal outside of the hospital.  He was interviewed with his sister at the bedside who helped reinforce this team's recommendations to continue hospitalization.  Physical exam Blood pressure (!) 142/80, pulse (!) 109, temperature 98.4 F (36.9 C), resp. rate 20, SpO2 98 %.  Male sitting upright in bed, no apparent acute distress Regular tachycardia Breathing is regular and unlabored Abdomen is soft and nontender Skin is warm and dry Alert and oriented to person, place, not year, no nystagmus, coarse bilateral upper extremity tremor, pupils are equal and reactive to light And affect are concordant, seems frustrated  Weight change:   No intake or output data in the 24 hours ending 05/14/22 1740  Labs Liver enzymes within normal limits BUNs/creatinine 6/1.94 T. bili is 1.3 Lactate 1.8 Osmolality 350 Hemoglobin 9.8, down from 11.9 Platelets 163 PT/INR 17.1/1.4 Acetaminophen, salicylate, and ethanol negative Acetone and isopropanol levels elevated  Assessment and plan Hospital day 0  Chase Parsons is a 67 y.o. admitted after an isopropyl alcohol ingestion with an AKI and signs of acute alcohol withdrawal.  Principal Problem:   Isopropyl alcohol poisoning Active Problems:   Encephalopathy acute   Alcohol withdrawal ([place])   Cirrhosis ([place])   AKI (acute kidney injury) ([place])  Encephalopathy secondary to isopropyl alcohol intoxication, improving My impression is that he consumed isopropyl alcohol for recreation rather than self-harm.  Hemodynamically stable and protecting his airway.  No other coingestions suspected.  Acute alcohol withdrawal Having headache, tremor, and anxiety.  Per his sister he drinks up to and possibly more than 2 quarts of beer per day.  It has been 2 to 3 days since his last drink of ethanol.  Index of  suspicion for withdrawal in this patient with chronic alcohol use disorder and early signs of withdrawal.  Is this team's recommendation that, since he wishes to quit alcohol, he stayed in the hospital at least overnight for monitoring. - Continue folate and thiamine - CIWA with Ativan  Cirrhosis Euvolemic.  Encephalopathy likely secondary to isopropyl alcohol intoxication versus alcohol withdrawal.  Appears well compensated. - Will restart rifaximin  Acute kidney injury Anion gap metabolic acidosis More likely prerenal in the setting of poor p.o. intake prior to admission.  AGMA likely secondary to the same.  Fluids were continued throughout the day. - A.m. BMP.  Diet: Regular IVF: None VTE: Lovenox Code: Full code PT/OT recommendations: No follow-up PT/OT TOC recommendations: Substance use and mental health resources added to AVS Family Update: Sister at bedside  Discharge plan: pending evaluation and treatment of alcohol withdrawal   Nani Gasser MD 05/14/2022, 5:40 PM  Pager: [phone] After 5pm on weekdays and 1pm on weekends: [phone]

## 2022-05-14 NOTE — TOC Progression Note (Signed)
Transition of Care [facility]) - Initial/Assessment Note    Patient Details  Name: Chase Parsons MRN: [id number] Date of Birth: [birth year]  Transition of [facility]) CM/SW Contact:    Milinda Antis, [place] Phone Number: 05/14/2022, 4:03 PM  Clinical Narrative:                 LCSW received consult for substance use.  Both substance use and mental health resources have been added to the AVS.    TOC following.   Patient Goals and CMS Choice            Expected Discharge Plan and Services                                              Prior Living Arrangements/Services                       Activities of Daily Living Home Assistive Devices/Equipment: None ADL Screening (condition at time of admission) Patient's cognitive ability adequate to safely complete daily activities?: Yes Is the patient deaf or have difficulty hearing?: No Does the patient have difficulty seeing, even when wearing glasses/contacts?: No Does the patient have difficulty concentrating, remembering, or making decisions?: Yes Patient able to express need for assistance with ADLs?: Yes Does the patient have difficulty dressing or bathing?: No Independently performs ADLs?: Yes (appropriate for developmental age) Does the patient have difficulty walking or climbing stairs?: No Weakness of Legs: None Weakness of Arms/Hands: None  Permission Sought/Granted                  Emotional Assessment              Admission diagnosis:  Hypokalemia [E87.6] Isopropyl alcohol poisoning [T51.2X1A] Patient Active Problem List   Diagnosis Date Noted   Isopropyl alcohol poisoning 05/14/2022   CAD in native artery 02/25/2021   Blurry vision, bilateral 09/06/2020   Weight loss, unintentional 09/06/2020   Elevated ferritin level 05/39/7673   Alcoholic cirrhosis ([place]) 41/93/7902   Hypertension 02/29/2020   Diarrhea 12/06/2019   Insomnia 12/06/2019   Healthcare maintenance 12/06/2019    GERD (gastroesophageal reflux disease) 12/06/2019   Normocytic anemia 12/06/2019   Physical deconditioning 10/24/2019   Alcohol use disorder, mild, in controlled environment 10/24/2019   MDD (major depressive disorder), single episode, severe , no psychosis ([place]) 01/26/2015   PCP:  Dorethea Clan, DO Pharmacy:   [facility] [place] - [address] [place]) [place] [address] Phone: [phone] Fax: [phone]     Social Determinants of Health (SDOH) Social History: SDOH Screenings   Food Insecurity: No Food Insecurity (07/15/2020)  Housing: Low Risk  (07/15/2020)  Transportation Needs: No Transportation Needs (07/15/2020)  Alcohol Screen: Medium Risk (07/15/2020)  Depression (PHQ2-9): Low Risk  (08/12/2021)  Financial Resource Strain: Low Risk  (07/15/2020)  Physical Activity: Inactive (07/15/2020)  Stress: Stress Concern Present (07/15/2020)  Tobacco Use: Low Risk  (03/10/2022)   SDOH Interventions:     Readmission Risk Interventions     No data to display

## 2022-05-14 NOTE — Evaluation (Signed)
Physical Therapy Evaluation Patient Details Name: Chase Parsons MRN: [id number] DOB: [birth year] Today's Date: 05/14/2022  History of Present Illness  Pt is a 67 y.o. male who presented 05/13/22 with AMS after being found with rubbing alcohol bottle in his hand. Pt admitted with acute encephalopathy likely in the setting of isopropyl alcohol ingestion. PMH - HTN, cirrhosis, ETOH use disorder, CAD, hyponatremia, suicidal thoughts   Clinical Impression  Pt presents with condition above and deficits mentioned below, see PT Problem List. Pt is displaying impairments in his cognition as he was disoriented to situation and date and displayed slow processing, poor attention span, and poor safety awareness. This PT found pt crawling in quadruped on the bed after apparently taking himself to the bathroom with his IV having been pulled out and lying on the floor. RN notified. Thus, it is unclear if his report of his PLOF and home set-up is accurate. He reports he lives with his sister and her husband in a 1-level house with a level entry. He reports he uses a walker but the chart indicates he was likely independent without AD. Currently, pt is demonstrating deficits not only in cognition but also in gross strength and coordination that impact his balance and safety with functional mobility. Pt is needing up to minA to ambulate, intermittently holding onto a wall for support. Pt will likely progress well, thus anticipate no PT follow up needed, but will continue to follow acutely to maximize his return to baseline prior to d/c home. Currently recommending 24/7 support at d/c as he would be unsafe to be home alone with him being at risk for falls and injuries at this time.       Recommendations for follow up therapy are one component of a multi-disciplinary discharge planning process, led by the attending physician.  Recommendations may be updated based on patient status, additional functional criteria and insurance  authorization.  Follow Up Recommendations No PT follow up      Assistance Recommended at Discharge Frequent or constant Supervision/Assistance (initially)  Patient can return home with the following  A little help with walking and/or transfers;A little help with bathing/dressing/bathroom;Assistance with cooking/housework;Direct supervision/assist for financial management;Direct supervision/assist for medications management;Assist for transportation;Help with stairs or ramp for entrance    Equipment Recommendations Rolling walker (2 wheels)  Recommendations for Other Services       Functional Status Assessment Patient has had a recent decline in their functional status and demonstrates the ability to make significant improvements in function in a reasonable and predictable amount of time.     Precautions / Restrictions Precautions Precautions: Fall Restrictions Weight Bearing Restrictions: No      Mobility  Bed Mobility Overal bed mobility: Needs Assistance Bed Mobility: Supine to Sit, Sit to Supine     Supine to sit: Supervision Sit to supine: Supervision   General bed mobility comments: Pt crawling in qadruped position into bed upon arrival and to return to bed end of session. Supervision for safety    Transfers Overall transfer level: Needs assistance Equipment used: None Transfers: Sit to/from Stand Sit to Stand: Min guard           General transfer comment: Pt needing extra time to come to stand and gain stability, min guard for safety    Ambulation/Gait Ambulation/Gait assistance: Min assist, Min guard Gait Distance (Feet): 98 Feet Assistive device: None, 1 person hand held assist Gait Pattern/deviations: Step-through pattern, Decreased stride length, Trunk flexed Gait velocity: reduced Gait velocity interpretation: <1.31  ft/sec, indicative of household ambulator   General Gait Details: Pt with slow, small, unsteady steps, intermittently reaching out for  UE support on furniture or walls. Min guard-minA for stability.  Stairs            Wheelchair Mobility    Modified Rankin (Stroke Patients Only)       Balance Overall balance assessment: Needs assistance Sitting-balance support: No upper extremity supported, Feet supported Sitting balance-Leahy Scale: Fair     Standing balance support: Single extremity supported, No upper extremity supported, During functional activity Standing balance-Leahy Scale: Fair Standing balance comment: Benefits from at least 1 UE support or needs up to minA to prevent LOB when ambulating without UE support                             Pertinent Vitals/Pain Pain Assessment Pain Assessment: Faces Faces Pain Scale: No hurt Pain Intervention(s): Monitored during session    Home Living Family/patient expects to be discharged to:: Private residence Living Arrangements: Other relatives (sister and her husband) Available Help at Discharge: Family;Available 24 hours/day (they work from home) Type of Home: House Home Access: Level entry       [place]: One level   Additional Comments: Pt's reports vary slightly compared to prior entries, like whether the house has level entry or STE, but unsure if this is same residence as before.    Prior Function Prior Level of Function : Independent/Modified Independent;Patient poor historian/Family not available             Mobility Comments: Pt reports using a walker at home, but unsure if this is accurate as pt has some AMS and no family present to confirm. Per chart, pt was likely independent at baseline       Hand Dominance        Extremity/Trunk Assessment   Upper Extremity Assessment Upper Extremity Assessment: Defer to OT evaluation    Lower Extremity Assessment Lower Extremity Assessment: RLE deficits/detail;LLE deficits/detail RLE Deficits / Details: gross weakness and incoordination noted functionally LLE Deficits /  Details: gross weakness and incoordination noted functionally    Cervical / Trunk Assessment Cervical / Trunk Assessment: Normal  Communication   Communication: No difficulties  Cognition Arousal/Alertness: Awake/alert Behavior During Therapy: Flat affect Overall Cognitive Status: Impaired/Different from baseline Area of Impairment: Orientation, Attention, Memory, Following commands, Safety/judgement, Awareness, Problem solving                 Orientation Level: Disoriented to, Time, Situation Current Attention Level: Sustained Memory: Decreased short-term memory Following Commands: Follows one step commands consistently, Follows one step commands with increased time, Follows multi-step commands inconsistently Safety/Judgement: Decreased awareness of safety, Decreased awareness of deficits Awareness: Emergent Problem Solving: Slow processing, Difficulty sequencing, Requires verbal cues General Comments: Pt needing repeated cues to gain attention and remain on task. Pt stated he was in the hospital because he was "sick" but did not specify why. Pt unsure of month or year. Slow to process cues and poor safety awareness, found crawling back into bed after apparently taking himself to the bathroom and pulling out his IV. RN notified and stated she instructed him to use the call bell to call to go to the bathroom        General Comments      Exercises     Assessment/Plan    PT Assessment Patient needs continued PT services  PT Problem List Decreased strength;Decreased  activity tolerance;Decreased balance;Decreased mobility;Decreased coordination;Decreased cognition;Decreased safety awareness       PT Treatment Interventions DME instruction;Gait training;Stair training;Functional mobility training;Therapeutic exercise;Therapeutic activities;Balance training;Neuromuscular re-education;Cognitive remediation;Patient/family education    PT Goals (Current goals can be found in the  Care Plan section)  Acute Rehab PT Goals Patient Stated Goal: to get back to bed and get warm PT Goal Formulation: With patient Time For Goal Achievement: 05/28/22 Potential to Achieve Goals: Good    Frequency Min 3X/week     Co-evaluation               AM-PAC PT "6 Clicks" Mobility  Outcome Measure Help needed turning from your back to your side while in a flat bed without using bedrails?: A Little Help needed moving from lying on your back to sitting on the side of a flat bed without using bedrails?: A Little Help needed moving to and from a bed to a chair (including a wheelchair)?: A Little Help needed standing up from a chair using your arms (e.g., wheelchair or bedside chair)?: A Little Help needed to walk in hospital room?: A Little Help needed climbing 3-5 steps with a railing? : A Little 6 Click Score: 18    End of Session Equipment Utilized During Treatment: Gait belt Activity Tolerance: Patient tolerated treatment well Patient left: in bed;with call bell/phone within reach Nurse Communication: Mobility status;Other (comment) (pt pulled out IV prior to PT arrival) PT Visit Diagnosis: Unsteadiness on feet (R26.81);Other abnormalities of gait and mobility (R26.89);Muscle weakness (generalized) (M62.81);Difficulty in walking, not elsewhere classified (R26.2)    Time: 7579-7282 PT Time Calculation (min) (ACUTE ONLY): 12 min   Charges:   PT Evaluation $PT Eval Moderate Complexity: 1 Mod          Moishe Spice, PT, DPT Acute Rehabilitation Services  Office: [phone]   Orvan Falconer 05/14/2022, 11:58 AM

## 2022-05-14 NOTE — ED Notes (Signed)
ED TO INPATIENT HANDOFF REPORT  ED Nurse Name and Phone #: Naquan Garman [phone]  S Name/Age/Gender Park Breed 67 y.o. male Room/Bed: 009C/009C  Code Status   Code Status: Full Code  Home/SNF/Other Home Patient oriented to: self, place, and situation Is this baseline? Yes   Triage Complete: Triage complete  Chief Complaint Isopropyl alcohol poisoning [T51.2X1A]  Triage Note Patient arrived with EMS from home reports drank approx. 750 ml of Rubbing Alcohol 70% this evening . CBG= 164.    Allergies No Known Allergies  Level of Care/Admitting Diagnosis ED Disposition     ED Disposition  Admit   Condition  --   Comment  Hospital Area: [place] [[id number]]  Level of Care: Telemetry Medical [[id number]]  May admit patient to [facility] or [facility] if equivalent level of care is available:: No  Covid Evaluation: Asymptomatic - no recent exposure (last 10 days) testing not required  Diagnosis: Isopropyl alcohol poisoning [258527]  Admitting Physician: Campbell Riches [[id number]]  Attending Physician: Johnnye Sima, JEFFREY C [[id number]]  Certification:: I certify this patient will need inpatient services for at least 2 midnights  Estimated Length of Stay: 3          B Medical/Surgery History Past Medical History:  Diagnosis Date   Alcohol abuse    Alcohol use disorder, moderate, dependence ([place]) 10/24/2019   CAD in native artery 02/25/2021   Encephalopathy    Hyponatremia 10/24/2019   Pneumonia    Severe single current episode of major depressive disorder, without psychotic features ([place]) 01/26/2015   Suicidal thoughts    Past Surgical History:  Procedure Laterality Date   BIOPSY  02/20/2020   Procedure: BIOPSY;  Surgeon: Otis Brace, MD;  Location: [facility];  Service: Gastroenterology;;  EGD and COLON   COLONOSCOPY WITH PROPOFOL N/A 02/20/2020   Procedure: COLONOSCOPY WITH PROPOFOL;  Surgeon: Otis Brace, MD;  Location: [facility];  Service:  Gastroenterology;  Laterality: N/A;   ESOPHAGOGASTRODUODENOSCOPY N/A 08/30/2020   Procedure: ESOPHAGOGASTRODUODENOSCOPY (EGD);  Surgeon: Otis Brace, MD;  Location: [facility] END[facility]COPY;  Service: Gastroenterology;  Laterality: N/A;   ESOPHAGOGASTRODUODENOSCOPY (EGD) WITH PROPOFOL N/A 02/20/2020   Procedure: ESOPHAGOGASTRODUODENOSCOPY (EGD) WITH PROPOFOL;  Surgeon: Otis Brace, MD;  Location: [facility];  Service: Gastroenterology;  Laterality: N/A;   POLYPECTOMY  02/20/2020   Procedure: POLYPECTOMY;  Surgeon: Otis Brace, MD;  Location: [facility];  Service: Gastroenterology;;     A IV Location/Drains/Wounds Patient Lines/Drains/Airways Status     Active Line/Drains/Airways     None            Intake/Output Last 24 hours No intake or output data in the 24 hours ending 05/14/22 1249  Labs/Imaging Results for orders placed or performed during the hospital encounter of 05/13/22 (from the past 48 hour(s))  CBC with Differential     Status: Abnormal   Collection Time: 05/13/22  9:28 PM  Result Value Ref Range   WBC 8.8 4.0 - 10.5 K/uL   RBC 3.52 (L) 4.22 - 5.81 MIL/uL   Hemoglobin 11.8 (L) 13.0 - 17.0 g/dL   HCT 34.5 (L) 39.0 - 52.0 %   MCV 98.0 80.0 - 100.0 fL   MCH 33.5 26.0 - 34.0 pg   MCHC 34.2 30.0 - 36.0 g/dL   RDW 13.3 11.5 - 15.5 %   Platelets 202 150 - 400 K/uL   nRBC 0.0 0.0 - 0.2 %   Neutrophils Relative % 53 %   Neutro Abs 4.7 1.7 - 7.7 K/uL  Lymphocytes Relative 36 %   Lymphs Abs 3.2 0.7 - 4.0 K/uL   Monocytes Relative 9 %   Monocytes Absolute 0.8 0.1 - 1.0 K/uL   Eosinophils Relative 0 %   Eosinophils Absolute 0.0 0.0 - 0.5 K/uL   Basophils Relative 1 %   Basophils Absolute 0.1 0.0 - 0.1 K/uL   Immature Granulocytes 1 %   Abs Immature Granulocytes 0.04 0.00 - 0.07 K/uL    Comment: Performed at [facility] [address] [place], [place] [address]  Comprehensive metabolic panel     Status: Abnormal   Collection Time: 05/13/22  9:28  PM  Result Value Ref Range   Sodium 143 135 - 145 mmol/L   Potassium 2.8 (L) 3.5 - 5.1 mmol/L   Chloride 105 98 - 111 mmol/L   CO2 21 (L) 22 - 32 mmol/L   Glucose, Bld 156 (H) 70 - 99 mg/dL    Comment: Glucose reference range applies only to samples taken after fasting for at least 8 hours.   BUN 7 (L) 8 - 23 mg/dL   Creatinine, Ser 2.26 (H) 0.61 - 1.24 mg/dL   Calcium 8.9 8.9 - 10.3 mg/dL   Total Protein 8.6 (H) 6.5 - 8.1 g/dL   Albumin 4.5 3.5 - 5.0 g/dL   AST 42 (H) 15 - 41 U/L   ALT 32 0 - 44 U/L   Alkaline Phosphatase 69 38 - 126 U/L   Total Bilirubin 1.1 0.3 - 1.2 mg/dL   GFR, Estimated 31 (L) >60 mL/min    Comment: (NOTE) Calculated using the CKD-EPI Creatinine Equation (2021)    Anion gap 17 (H) 5 - 15    Comment: Performed at [facility], [place] [address] [place], [place] [address]  Lipase, blood     Status: None   Collection Time: 05/13/22  9:28 PM  Result Value Ref Range   Lipase 26 11 - 51 U/L    Comment: Performed at [facility], [place] [address], [place], [place] [address]  Volatiles,Blood (acetone,ethanol,isoprop,methanol)     Status: Abnormal   Collection Time: 05/13/22  9:28 PM  Result Value Ref Range   Acetone, blood 0.212 (H) 0.000 - 0.010 g/dL    Comment: (NOTE)                                Detection Limit = 0.010 Performed At: [facility] [place] [address] [place], [place] [id number] Rush Farmer MD MG:[id number]    Ethanol, blood <0.010 0.000 - 0.010 g/dL    Comment: (NOTE) Reported positive results of 0.212 for acetone and 0.065 for isopropanol from blood volatiles panel for patient Tian Mcmurtrey at [facility] on 05/14/22. Spoke to [employer] in lab at 03:29 AM on 05/14/22.                                Detection Limit = 0.010 This test was developed and its performance characteristics determined by [facility]. It has not been cleared or approved by the Food and Drug Administration.    Isopropanol, blood 0.065  (H) 0.000 - 0.010 g/dL    Comment:                                 Detection Limit =  0.010   Methanol, blood <0.010 0.000 - 0.010 g/dL    Comment:                                 Detection Limit = 0.010  Ethanol     Status: None   Collection Time: 05/13/22  9:28 PM  Result Value Ref Range   Alcohol, Ethyl (B) <10 <10 mg/dL    Comment: (NOTE) Lowest detectable limit for serum alcohol is 10 mg/dL.  For medical purposes only. Performed at [facility], [place] [address] [place], [place] [address]   Salicylate level     Status: Abnormal   Collection Time: 05/13/22  9:28 PM  Result Value Ref Range   Salicylate Lvl <7.3 (L) 7.0 - 30.0 mg/dL    Comment: Performed at [facility] [address], [place], [place] [address]  I-Stat venous blood gas, ED     Status: Abnormal   Collection Time: 05/13/22  9:43 PM  Result Value Ref Range   pH, Ven 7.459 (H) 7.25 - 7.43   pCO2, Ven 30.1 (L) 44 - 60 mmHg   pO2, Ven 77 (H) 32 - 45 mmHg   Bicarbonate 21.4 20.0 - 28.0 mmol/L   TCO2 22 22 - 32 mmol/L   O2 Saturation 96 %   Acid-base deficit 2.0 0.0 - 2.0 mmol/L   Sodium 144 135 - 145 mmol/L   Potassium 2.8 (L) 3.5 - 5.1 mmol/L   Calcium, Ion 1.03 (L) 1.15 - 1.40 mmol/L   HCT 37.0 (L) 39.0 - 52.0 %   Hemoglobin 12.6 (L) 13.0 - 17.0 g/dL   Sample type VENOUS   I-stat chem 8, ED (not at [facility], [facility] or [facility])     Status: Abnormal   Collection Time: 05/13/22  9:46 PM  Result Value Ref Range   Sodium 146 (H) 135 - 145 mmol/L   Potassium 2.9 (L) 3.5 - 5.1 mmol/L   Chloride 106 98 - 111 mmol/L   BUN 6 (L) 8 - 23 mg/dL   Creatinine, Ser 0.90 0.61 - 1.24 mg/dL   Glucose, Bld 159 (H) 70 - 99 mg/dL    Comment: Glucose reference range applies only to samples taken after fasting for at least 8 hours.   Calcium, Ion 1.03 (L) 1.15 - 1.40 mmol/L   TCO2 22 22 - 32 mmol/L   Hemoglobin 12.9 (L) 13.0 - 17.0 g/dL   HCT 38.0 (L) 39.0 - 52.0 %  I-Stat venous blood gas, ([facility] ED, [facility], [facility])     Status:  Abnormal   Collection Time: 05/14/22 12:29 AM  Result Value Ref Range   pH, Ven 7.512 (H) 7.25 - 7.43   pCO2, Ven 31.7 (L) 44 - 60 mmHg   pO2, Ven 132 (H) 32 - 45 mmHg   Bicarbonate 25.4 20.0 - 28.0 mmol/L   TCO2 26 22 - 32 mmol/L   O2 Saturation 99 %   Acid-Base Excess 3.0 (H) 0.0 - 2.0 mmol/L   Sodium 145 135 - 145 mmol/L   Potassium 3.2 (L) 3.5 - 5.1 mmol/L   Calcium, Ion 1.04 (L) 1.15 - 1.40 mmol/L   HCT 35.0 (L) 39.0 - 52.0 %   Hemoglobin 11.9 (L) 13.0 - 17.0 g/dL   Sample type VENOUS   Magnesium     Status: Abnormal   Collection Time: 05/14/22  1:15 AM  Result Value Ref Range   Magnesium  1.6 (L) 1.7 - 2.4 mg/dL    Comment: Performed at [facility], [place] [address] [place], [place] [address]  Acetaminophen level     Status: Abnormal   Collection Time: 05/14/22  1:15 AM  Result Value Ref Range   Acetaminophen (Tylenol), Serum <10 (L) 10 - 30 ug/mL    Comment: (NOTE) Therapeutic concentrations vary significantly. A range of 10-30 ug/mL  may be an effective concentration for many patients. However, some  are best treated at concentrations outside of this range. Acetaminophen concentrations >150 ug/mL at 4 hours after ingestion  and >50 ug/mL at 12 hours after ingestion are often associated with  toxic reactions.  Performed at [facility], [place] [address] [place], [place] [address]   Beta-hydroxybutyric acid     Status: Abnormal   Collection Time: 05/14/22  1:15 AM  Result Value Ref Range   Beta-Hydroxybutyric Acid 1.40 (H) 0.05 - 0.27 mmol/L    Comment: Performed at [facility] [address], [place], [place] [address]  HIV Antibody (routine testing w rflx)     Status: None   Collection Time: 05/14/22  1:15 AM  Result Value Ref Range   HIV Screen 4th Generation wRfx Non Reactive Non Reactive    Comment: Performed at [facility], [place] [address], [place], [place] [address]  Comprehensive metabolic panel     Status: Abnormal   Collection  Time: 05/14/22  4:40 AM  Result Value Ref Range   Sodium 139 135 - 145 mmol/L   Potassium 4.2 3.5 - 5.1 mmol/L   Chloride 106 98 - 111 mmol/L   CO2 23 22 - 32 mmol/L   Glucose, Bld 122 (H) 70 - 99 mg/dL    Comment: Glucose reference range applies only to samples taken after fasting for at least 8 hours.   BUN 6 (L) 8 - 23 mg/dL   Creatinine, Ser 1.94 (H) 0.61 - 1.24 mg/dL   Calcium 8.5 (L) 8.9 - 10.3 mg/dL   Total Protein 7.4 6.5 - 8.1 g/dL   Albumin 3.9 3.5 - 5.0 g/dL   AST 37 15 - 41 U/L   ALT 28 0 - 44 U/L   Alkaline Phosphatase 62 38 - 126 U/L   Total Bilirubin 1.3 (H) 0.3 - 1.2 mg/dL   GFR, Estimated 37 (L) >60 mL/min    Comment: (NOTE) Calculated using the CKD-EPI Creatinine Equation (2021)    Anion gap 10 5 - 15    Comment: Performed at [facility], [place] [address], [place], [place] [address]  CBC     Status: Abnormal   Collection Time: 05/14/22  4:40 AM  Result Value Ref Range   WBC 8.8 4.0 - 10.5 K/uL   RBC 2.90 (L) 4.22 - 5.81 MIL/uL   Hemoglobin 9.8 (L) 13.0 - 17.0 g/dL   HCT 28.5 (L) 39.0 - 52.0 %   MCV 98.3 80.0 - 100.0 fL   MCH 33.8 26.0 - 34.0 pg   MCHC 34.4 30.0 - 36.0 g/dL   RDW 13.4 11.5 - 15.5 %   Platelets 163 150 - 400 K/uL   nRBC 0.0 0.0 - 0.2 %    Comment: Performed at [facility], [place] [address] [place], [place] [address]  Protime-INR     Status: Abnormal   Collection Time: 05/14/22  4:40 AM  Result Value Ref Range   Prothrombin Time 17.1 (H) 11.4 - 15.2 seconds   INR  1.4 (H) 0.8 - 1.2    Comment: (NOTE) INR goal varies based on device and disease states. Performed at [facility], [place] [address], [place], [place] [address]   Lactic acid, plasma     Status: None   Collection Time: 05/14/22  4:40 AM  Result Value Ref Range   Lactic Acid, Venous 1.8 0.5 - 1.9 mmol/L    Comment: Performed at [facility] [address], [place], [place] [address]  Ferritin     Status: Abnormal   Collection Time: 05/14/22  4:40 AM   Result Value Ref Range   Ferritin 518 (H) 24 - 336 ng/mL    Comment: Performed at [facility], [place] [address], [place], [place] [address]  Iron and TIBC     Status: Abnormal   Collection Time: 05/14/22  4:40 AM  Result Value Ref Range   Iron 157 45 - 182 ug/dL   TIBC 300 250 - 450 ug/dL   Saturation Ratios 52 (H) 17.9 - 39.5 %   UIBC 143 ug/dL    Comment: Performed at [facility], [place] [address], [place], [place] [address]   No results found.  Pending Labs Unresulted Labs (From admission, onward)     Start     Ordered   05/14/22 0159  Lactic acid, plasma  STAT Now then every 3 hours,   R (with STAT occurrences)      05/14/22 0158   05/14/22 0019  Osmolality  Once,   URGENT        05/14/22 0019   05/13/22 2137  Urinalysis, Routine w reflex microscopic Urine, Clean Catch  (ED Abdominal Pain)  Once,   URGENT        05/13/22 2138            Vitals/Pain Today's Vitals   05/14/22 0759 05/14/22 0830 05/14/22 1100 05/14/22 1200  BP:  132/75 119/74   Pulse:  (!) 105 100   Resp:  20 17   Temp: 97.7 F (36.5 C)   97.8 F (36.6 C)  TempSrc: Oral   Oral  SpO2:  98% 95%   PainSc:        Isolation Precautions No active isolations  Medications Medications  enoxaparin (LOVENOX) injection 30 mg (30 mg Subcutaneous Given 05/14/22 0919)  lactated ringers infusion (0 mLs Intravenous Stopped 05/14/22 1150)  LORazepam (ATIVAN) tablet 1 mg (0 mg Oral Hold 05/14/22 0308)  thiamine (VITAMIN B1) tablet 100 mg (100 mg Oral Given 05/14/22 0919)    Or  thiamine (VITAMIN B1) injection 100 mg ( Intravenous See Alternative 0/99/83 3825)  folic acid (FOLVITE) tablet 1 mg (1 mg Oral Given 05/14/22 0919)  multivitamin with minerals tablet 1 tablet (1 tablet Oral Given 05/14/22 0919)  thiamine (VITAMIN B1) injection 100 mg (100 mg Intravenous Given 0/53/97 6734)  folic acid 1 mg in sodium chloride 0.9 % 50 mL IVPB (0 mg Intravenous Stopped 05/13/22 2358)  lactated ringers bolus 1,000  mL (0 mLs Intravenous Stopped 05/14/22 0202)  potassium chloride 10 mEq in 100 mL IVPB (0 mEq Intravenous Stopped 05/14/22 0237)  potassium chloride 10 mEq in 100 mL IVPB (0 mEq Intravenous Stopped 05/14/22 0549)  magnesium sulfate IVPB 2 g 50 mL (0 g Intravenous Stopped 05/14/22 0444)  LORazepam (ATIVAN) 2 MG/ML injection (1 mg  Given 05/14/22 0258)    Mobility walks with person assist     Focused Assessments    R Recommendations: See Admitting  Provider Note  Report given to:   Additional Notes:

## 2022-05-14 NOTE — Progress Notes (Signed)
CC: Hospital (isopropyl alcohol poisoning) Follow-up  HPI:   [name] is a 67 y.o. male with a past medical history of alcoholic cirrhosis, CAD, alcohol use, and depression who presents for hospital follow-up. He was admitted on 1-17 for isopropyl alcohol poisoning and discharged on 1-19 with vitamin B1-B9 supplementation. Last seen at [facility] 07-2021.    Past Medical History:  Diagnosis Date   Alcohol abuse    Alcohol use disorder, moderate, dependence ([place]) 10/24/2019   CAD in native artery 02/25/2021   Encephalopathy    Hyponatremia 10/24/2019   Pneumonia    Severe single current episode of major depressive disorder, without psychotic features ([place]) 01/26/2015   Suicidal thoughts      Review of Systems:    Reports sleepiness, diarrhea Denies abdominal pain, dizziness, lightheadedness, confusion, bowel changes   Physical Exam:  Vitals:   05/18/22 0925 05/18/22 0957  BP: (!) 162/101 (!) 153/97  Pulse: 92 97  Temp: 97.9 F (36.6 C)   TempSrc: Oral   SpO2: 100%   Weight: 166 lb 4.8 oz (75.4 kg)   Height: 6' (1.829 m)     General:   awake and alert, sitting comfortably in chair, cooperative, not in acute distress Skin:   warm and dry, intact without any obvious lesions or scars, no rashes or lesions  Lungs:   normal respiratory effort, breathing unlabored, symmetrical chest rise, no crackles or wheezing Cardiac:   regular rate and rhythm, normal S1 and S2 Abdomen:   soft and non-distended Neurologic:   oriented to person-place-time, no gross focal deficits Psychiatric:   euthymic mood with congruent affect, intelligible speech    Assessment & Plan:   Alcohol use disorder, mild, in controlled environment Patient was hospitalized on 1-17 for isopropyl alcohol poisoning and discharged on 1-19 with vitamin B1-B9 supplementation. He completed the thiamine today and continues to take folic acid daily. Since leaving the hospital, he has felt good though reports  sleeping a lot. Denies confusion, dizziness, abdominal pain, and ingesting alcohol in any form since discharge. He acknowledges that abstinence from alcohol is important and has close friend support at home to help him achieve this goal. X  - Encourage alcohol cessation and commend current efforts - Continue vitaminB9 '1mg'$  q24    Hypokalemia Patient's potassium on day of discharge 1-19 was 2.9. Magnesium was also low at 1.6. Repeat studies indicated to monitor levels of these two electrolytes.  - Check BMP, consider starting magnesium oxide and potassium chloride supplementation if values low    Hypertension Patient has history of hypertension managed at home with amlodipine, which he reports taking daily. Has a cuff at home, however, does not regularly check his blood pressure. Today in clinic, BP was 162/101 and repeat 153/97. May need to increase amlodipine dose at next visit, but will first want accurate record of his values at home to better inform our decision.  - Instruct daily measurement and recording of blood pressure at home - Return to clinic in three months, consider increasing amlodipine dose    MDD (major depressive disorder), single episode, severe , no psychosis ([place]) Patient takes fluoxetine daily and has been for several years, first starting in 2016 per electronic medical record. His mood has improved significantly on this medication, and he is tolerating it well.  - Continue fluoxetine '10mg'$  F75    Alcoholic cirrhosis (HCC) Patient has history of alcoholic cirrhosis, complicated by encephalopathy. Ultrasound performed 10-2021 notable for increased hepatic parenchymal echogenicity suggestive of steatosis.  Follows with gastroenterologist Dr Alessandra Bevels, last visit 11-2021. Takes rifaximin at home, which has also helped decrease the frequency of his diarrhea.   - Continue rifaximin '550mg'$  q12 - Continue to follow with gastroenterology regularly      See Encounters  Tab for problem based charting.  Patient discussed with Dr. Heber Middles[place]

## 2022-05-14 NOTE — ED Notes (Signed)
Patient continues to pull himself off dinamap monitor. Patient removed IV from arm and had IVF run onto himself and floor. New IV started and patient will be placed in non-violent restraints

## 2022-05-14 NOTE — ED Notes (Signed)
Pt A&O x3 at this time. Pt c/o abd pain. Ambulated to restroom with a steady gait to have a BM. Updated on plan of care.

## 2022-05-14 NOTE — Hospital Course (Addendum)
Principal Problem:   Isopropyl alcohol poisoning Active Problems:   Cirrhosis ([place])  Resolved Problems:   Encephalopathy acute   Alcohol withdrawal ([place])   AKI (acute kidney injury) ([place])  Consults:***  Procedures:***  Follow-up items:***

## 2022-05-14 NOTE — Progress Notes (Signed)
Occupational Therapy Evaluation and Discharge Patient Details Name: Jaque Dacy MRN: [id number] DOB: 1955/11/17 Today's Date: 05/14/2022   History of Present Illness Pt is a 67 y.o. male who presented 05/13/22 with AMS after being found with rubbing alcohol bottle in his hand. Pt admitted with acute encephalopathy likely in the setting of isopropyl alcohol ingestion. PMH - HTN, cirrhosis, ETOH use disorder, CAD, hyponatremia, suicidal thoughts   Clinical Impression   This 67 yo male admitted with above presents to acute OT with PLOF of being independent with basic ADLs, living at his sister's house, and never being left alone. He currently is min guard A without AD when up on his feet due to unsteadiness due to current admission reason. No skilled OT needs identified, we will sign off.      Recommendations for follow up therapy are one component of a multi-disciplinary discharge planning process, led by the attending physician.  Recommendations may be updated based on patient status, additional functional criteria and insurance authorization.   Follow Up Recommendations  No OT follow up     Assistance Recommended at Discharge PRN (someone with him any time he is up on his feet)  Patient can return home with the following A little help with walking and/or transfers;A little help with bathing/dressing/bathroom;Assistance with cooking/housework;Assist for transportation;Help with stairs or ramp for entrance;Direct supervision/assist for financial management;Direct supervision/assist for medications management    Functional Status Assessment  Patient has had a recent decline in their functional status and demonstrates the ability to make significant improvements in function in a reasonable and predictable amount of time. (without further skilled OT services)  Equipment Recommendations  None recommended by OT       Precautions / Restrictions Precautions Precautions:  Fall Restrictions Weight Bearing Restrictions: No      Mobility Bed Mobility Overal bed mobility: Needs Assistance Bed Mobility: Supine to Sit, Sit to Supine     Supine to sit: Supervision, HOB elevated Sit to supine: Supervision        Transfers Overall transfer level: Needs assistance Equipment used: None Transfers: Sit to/from Stand Sit to Stand: Min guard           General transfer comment: Ambulate in room with min guard A due to unsteadiness when up on feet. He reports this is normal, sister reports he normally moves faster than he was with me      Balance Overall balance assessment: Needs assistance Sitting-balance support: Feet supported, No upper extremity supported   Sitting balance - Comments: able to sit down, take pants off (on wrong side out and backwards), but them back on, stand up and pull them up   Standing balance support: No upper extremity supported, During functional activity Standing balance-Leahy Scale: Poor Standing balance comment: min guard A                           ADL either performed or assessed with clinical judgement   ADL                                         General ADL Comments: Overall at a minguard A level when up on his feet due to pt being wobbly     Vision Patient Visual Report: No change from baseline              Pertinent  Vitals/Pain Pain Assessment Pain Assessment: No/denies pain     Hand Dominance Right   Extremity/Trunk Assessment Upper Extremity Assessment Upper Extremity Assessment: Overall WFL for tasks assessed      Communication Communication Communication: No difficulties   Cognition Arousal/Alertness: Awake/alert Behavior During Therapy: Flat affect   Area of Impairment: Orientation, Attention, Following commands, Safety/judgement, Awareness, Problem solving                 Orientation Level: Disoriented to, Time, Situation (2004, "don't know" to  month, "sick") Current Attention Level: Sustained   Following Commands: Follows one step commands consistently Safety/Judgement: Decreased awareness of safety, Decreased awareness of deficits Awareness: Emergent Problem Solving: Slow processing                  Home Living Family/patient expects to be discharged to:: Private residence Living Arrangements: Other relatives (sister,her husband, grandson and great grandson) Available Help at Discharge: Family;Available 24 hours/day Type of Home: House Home Access: Level entry     Home Layout: One level     Bathroom Shower/Tub: Teacher, early years/pre: Standard     Home Equipment: None   Additional Comments: Pt's reports vary slightly compared to prior entries, like whether the house has level entry or STE, but unsure if this is same residence as before.      Prior Functioning/Environment Prior Level of Function : Independent/Modified Independent             Mobility Comments: pt does not use a RW at home ADLs Comments: He stands to take a shower in at tub/shower combination        OT Problem List: Impaired balance (sitting and/or standing)         OT Goals(Current goals can be found in the care plan section) Acute Rehab OT Goals Patient Stated Goal: to go home         AM-PAC OT "6 Clicks" Daily Activity     Outcome Measure Help from another person eating meals?: None Help from another person taking care of personal grooming?: A Little Help from another person toileting, which includes using toliet, bedpan, or urinal?: A Little Help from another person bathing (including washing, rinsing, drying)?: A Little Help from another person to put on and taking off regular upper body clothing?: A Little Help from another person to put on and taking off regular lower body clothing?: A Little 6 Click Score: 19   End of Session Equipment Utilized During Treatment: Gait belt Nurse Communication: Mobility  status (pt having tremors in hands with attempt to self feed)  Activity Tolerance: Patient tolerated treatment well Patient left: in bed;with call bell/phone within reach;with family/visitor present  OT Visit Diagnosis: Unsteadiness on feet (R26.81);Other abnormalities of gait and mobility (R26.89);Muscle weakness (generalized) (M62.81);Other symptoms and signs involving cognitive function                Time: 9702-6378 OT Time Calculation (min): 22 min Charges:  OT General Charges $OT Visit: 1 Visit OT Evaluation $OT Eval Moderate Complexity: 1 Mod  [place], OTR/L Acute [employer] [facility] [phone] Office [phone]    Almon Register 05/14/2022, 2:11 PM

## 2022-05-15 ENCOUNTER — Other Ambulatory Visit (HOSPITAL_COMMUNITY): Payer: Self-pay

## 2022-05-15 LAB — BASIC METABOLIC PANEL
Anion gap: 11 (ref 5–15)
BUN: 5 mg/dL — ABNORMAL LOW (ref 8–23)
CO2: 24 mmol/L (ref 22–32)
Calcium: 8.9 mg/dL (ref 8.9–10.3)
Chloride: 102 mmol/L (ref 98–111)
Creatinine, Ser: 1.24 mg/dL (ref 0.61–1.24)
GFR, Estimated: >60 mL/min (ref >60)
Glucose, Bld: 85 mg/dL (ref 70–99)
Potassium: 2.9 mmol/L — ABNORMAL LOW (ref 3.5–5.1)
Sodium: 137 mmol/L (ref 135–145)

## 2022-05-15 LAB — GLUCOSE, CAPILLARY
Glucose-Capillary: 101 mg/dL — ABNORMAL HIGH (ref 70–99)
Glucose-Capillary: 136 mg/dL — ABNORMAL HIGH (ref 70–99)

## 2022-05-15 LAB — CBC
HCT: 31.3 % — ABNORMAL LOW (ref 39.0–52.0)
Hemoglobin: 10.9 g/dL — ABNORMAL LOW (ref 13.0–17.0)
MCH: 33.7 pg (ref 26.0–34.0)
MCHC: 34.8 g/dL (ref 30.0–36.0)
MCV: 96.9 fL (ref 80.0–100.0)
Platelets: 174 10*3/uL (ref 150–400)
RBC: 3.23 MIL/uL — ABNORMAL LOW (ref 4.22–5.81)
RDW: 12.9 % (ref 11.5–15.5)
WBC: 7.1 10*3/uL (ref 4.0–10.5)
nRBC: 0 % (ref 0.0–0.2)

## 2022-05-15 MED ORDER — POTASSIUM CHLORIDE 10 MEQ/100ML IV SOLN
10.0000 meq | INTRAVENOUS | Status: DC
  Administered 2022-05-15 (×2): 10 meq via INTRAVENOUS
  Filled 2022-05-15 (×2): qty 100

## 2022-05-15 MED ORDER — THIAMINE HCL 100 MG PO TABS
100.0000 mg | ORAL_TABLET | Freq: Every day | ORAL | 0 refills | Status: AC
  Filled 2022-05-15: qty 3, 3d supply, fill #0

## 2022-05-15 MED ORDER — FOLIC ACID 1 MG PO TABS
1.0000 mg | ORAL_TABLET | Freq: Every day | ORAL | 2 refills | Status: AC
  Filled 2022-05-15: qty 30, 30d supply, fill #0
  Filled 2022-06-09: qty 30, 30d supply, fill #1

## 2022-05-15 MED ORDER — POTASSIUM CHLORIDE CRYS ER 20 MEQ PO TBCR
40.0000 meq | EXTENDED_RELEASE_TABLET | Freq: Two times a day (BID) | ORAL | Status: DC
  Administered 2022-05-15: 40 meq via ORAL
  Filled 2022-05-15: qty 2

## 2022-05-15 MED ORDER — POTASSIUM CHLORIDE 10 MEQ/100ML IV SOLN: 10.0000 meq | INTRAVENOUS | Status: DC

## 2022-05-15 NOTE — Discharge Summary (Signed)
Name: Chase Parsons MRN: [id number] DOB: January 16, 1956 67 y.o. PCP: Dorethea Clan, DO  Date of Admission: 05/13/2022  9:19 PM Date of Discharge: 05/15/2022 11:35 AM Attending Physician: Aldine Contes, MD  Discharge Diagnosis: Principal Problem:   Isopropyl alcohol poisoning Active Problems:   Cirrhosis ([place])   Discharge Medications: Allergies as of 05/15/2022   No Known Allergies      Medication List     TAKE these medications    amLODipine 5 MG tablet Commonly known as: NORVASC Take 1 tablet (5 mg total) by mouth daily.   FLUoxetine 10 MG capsule Commonly known as: PROZAC Take 1 capsule by mouth once daily   folic acid 1 MG tablet Commonly known as: FOLVITE Take 1 tablet (1 mg total) by mouth daily. Start taking on: May 16, 2022   multivitamin tablet Take 1 tablet by mouth daily.   thiamine 100 MG tablet Commonly known as: VITAMIN B1 Take 1 tablet (100 mg total) by mouth daily. Start taking on: May 16, 2022   Xifaxan 550 MG Tabs tablet Generic drug: rifaximin Take 1 tablet by mouth in the morning and at bedtime.        Disposition and follow-up:   Chase Parsons is a 67 y.o. year old admitted for acute encephalopathy secondary to isopropyl alcohol ingestion, hospital course complicated by concern for alcohol withdrawal.  Alcohol use disorder - Complete 5 days of thiamine on 10/22/3660 - Continue folic acid supplementation  Hypokalemia 2.9 on day of discharge.  Received 2 rounds of IV potassium and some p.o. potassium. - BMP  Follow-up Appointments:  Follow-up Information     Serita Butcher, MD .   Contact information: [place] [address] [phone]                 Hospital Course by problem list:  Acute encephalopathy secondary to isopropyl alcohol intoxication Presented after sister found him in bed with an empty bottle of isopropyl alcohol.  He was somnolent with metabolic derangements  largely consistent with isopropyl alcohol poisoning.  Do not suspect other coingestions.  Ethanol level was undetectable.  Over the course of 2 days his encephalopathy improved and by day of discharge he was back to his baseline per his sister, with whom he lives.  Alcohol use disorder Concern for alcohol withdrawal Patient had objective signs of withdrawal including tremors and tachycardia on hospital day 2.  CIWA score maxed out around 15 and he received a dose of Ativan the night before discharge.  Day of discharge, he is without signs of alcohol withdrawal.  AKI Likely prerenal rather than sequelae of isopropyl alcohol poisoning.  Resolved by day of discharge.  Diarrhea Hypokalemia Ongoing for few days prior to admission.  No signs of infection during this hospitalization.  Possibly the etiology for the hypokalemia.  Discharge Exam:   Feeling relatively well.  Still having diarrhea. Sister says that he is back to baseline.   Blood pressure 127/88, pulse (!) 106, temperature 98.2 F (36.8 C), temperature source Oral, resp. rate 18, SpO2 97 %.  Resting in bed Rate and rhythm are regular Breathing is unlabored and regular Skin is warm and dry Alert and oriented without focal neurologic deficits Mood and affect are concordant  Pertinent studies and procedures:   Latest Reference Range & Units 05/15/22 07:16  Sodium 135 - 145 mmol/L 137  Potassium 3.5 - 5.1 mmol/L 2.9 (L)  Chloride 98 - 111 mmol/L 102  CO2 22 -  32 mmol/L 24  Glucose 70 - 99 mg/dL 85  BUN 8 - 23 mg/dL 5 (L)  Creatinine 0.61 - 1.24 mg/dL 1.24  Calcium 8.9 - 10.3 mg/dL 8.9  Anion gap 5 - 15  11  GFR, Estimated >60 mL/min >60  (L): Data is abnormally low   Latest Reference Range & Units 05/14/22 01:15  Osmolality 275 - 295 mOsm/kg 350 (HH)  (HH): Data is critically high   Latest Reference Range & Units 05/14/22 04:40  Iron 45 - 182 ug/dL 157  UIBC ug/dL 143  TIBC 250 - 450 ug/dL 300  Saturation Ratios 17.9  - 39.5 % 52 (H)  Ferritin 24 - 336 ng/mL 518 (H)  (H): Data is abnormally high   Latest Reference Range & Units 05/15/22 07:16  WBC 4.0 - 10.5 K/uL 7.1  RBC 4.22 - 5.81 MIL/uL 3.23 (L)  Hemoglobin 13.0 - 17.0 g/dL 10.9 (L)  HCT 39.0 - 52.0 % 31.3 (L)  MCV 80.0 - 100.0 fL 96.9  MCH 26.0 - 34.0 pg 33.7  MCHC 30.0 - 36.0 g/dL 34.8  RDW 11.5 - 15.5 % 12.9  Platelets 150 - 400 K/uL 174  nRBC 0.0 - 0.2 % 0.0  (L): Data is abnormally low   Latest Reference Range & Units 05/14/22 04:40  Prothrombin Time 11.4 - 15.2 seconds 17.1 (H)  INR 0.8 - 1.2  1.4 (H)  (H): Data is abnormally high   Latest Reference Range & Units 05/14/22 01:15  Acetaminophen (Tylenol), S 10 - 30 ug/mL <10 (L)  (L): Data is abnormally low   Latest Reference Range & Units 67/34/19 37:90  Salicylate Lvl 7.0 - 24.0 mg/dL <7.0 (L)  (L): Data is abnormally low   Latest Reference Range & Units 05/13/22 21:28  Ethanol, blood 0.000 - 0.010 g/dL <0.010    Discharge Instructions:   Discharge Instructions         Outpatient Providers   [facility] ([facility]) Group and individual counseling. [address]  [place], [place] [address] [phone] [place]: [phone]  [place]: [phone] Medicaid and uninsured.   [facility] groups multiple times per week. [place], [place], [place] [address] [phone] Takes Medicaid and other insurances.   [place]  Chemical Dependency Intensive Outpatient Program (IOP) [address] [place], [place] [address] [phone] Takes Pharmacist, community and [place].   [facility]  IOP and Partial Hospitalization Program  [place].  [place], [place] [address] [phone] Private Insurance, [place] only for partial hospitalization     [facility] ([place]) IOP, individual counseling, medication management [place], [place] [address] [phone] Medicaid and [facility]  [place] [address]  [place], [place] [address] [phone] Private Insurance and [place] [facility] [address]  [place], [place] [address] [phone] Private Insurance, [place], and Self Pay   [facility]adone Clinic  [place], [place] [address] [facility]  [address]  [place], [place] [address] [phone]  [facility]  [address] [place], [place] [address] [phone]      Residential Treatment Programs  [facility] ([place], [place] [address] [phone] or [phone] Detox and Residential Rehab 14 days (Medicare, Medicaid, private insurance, and self pay)  [facility] Treatment Services [place], [place] [address] [phone] Detox (self Pay and Medicaid Limited availability) Rehab Only Male (Medicare, [place], and Self Pay)  [facility] [place] [address] [place], [place] [address] [phone] or [phone] Private Insurance only  [place]  [place]  [place], [place] [address] [phone] Treatment Only, must make assessment appointment, and must be sober for assessment appointment. Self pay, Medicare A and B, [facility], must be [facility] resident.      Residential Treatment [place] , [place]  [phone] [employer], [place], [place]. They offer assistance with transportation.   [facility] [address] [place],  [place], [place] [address] [phone] [facility] No insurance     [facility] [place], [place] [address] [phone] No pending legal charges, Long-term work program  [facility]: [facility]  [place], [place] [address] [address], [place], [place] [address] Residential treatment (takes people on Methadone/Suboxone)  Medicaid and uninsured  [facility]  [address], [place], [place] [address] [phone] or [phone] [employer] Insurance Only [place] [phone] Private Insurance Males/Females, call to make referrals, multiple facilities   [facility] [address]  [place], [place] [address]  [phone] Men Only Upfront Fee [facility] [address]      [facility] Women[facility] Program: [facility] [place], [place] [address] [phone]  [place], [place] [address] [phone] (f)  [place], [place] [address] [phone] (f)       Syringe Services Program Due to COVID-19, syringe services programs are likely operating under different hours with limited or no fixed site hours. Some programs may not be operating at all. Please contact the program directly using the phone numbers provided below to see if they are still operating under COVID-19.  [facility] Solution to the Opioid Problem (GCSTOP) Fixed; mobile; peer-based Midge Aver [phone] [email or web address] Fixed site exchange at [facility], [place]. [place], [place] [address] on Wednesdays (2:00 - 5:00 pm) and Thursdays (4:00 - 8:00 pm). Pop-up mobile exchange locations: [employer] and [employer], [address] [place], [place] [address] on Tuesdays (11:00 am - 1:00 pm) and Fridays (11:00 am - 1:00 pm) [address], [place], [place] [address] on Tuesdays (2:00 - 4:00 pm) and Fridays (2:00 - 4:00 pm) [facility] - also serves [place] and [employer] [place] [employer] Fixed; mobile; peer-based Rosemary Holms [phone] [email or web address] [address] [place], [place] [address] Delivery and outreach available in [place] and [place], please call for more information. Monday, Tuesday: 1:00 -7:00 pm Thursday: 4:00 pm - 8:00 pm Friday: 1:00 pm - 8:00 pm)   Psychiatry and [place], [place] [phone]  Psychiatrists [facility]    [facility] [address] [place]    [address], [place], [place] [address]     [place], [place] [address] [phone]      [phone]  Dr. Marilynne Drivers [facility] [address]  [place] [address]     [place] [address] [phone]      [place], [place] [address]     [place] [address]     [place] [address] [phone]        [phone]  Therapists [facility]    [facility] [address] [place], [place]  [facility]  [facility] [address]     [address] [place] [address]     [place], [place]      [phone]   [facility]    [facility] [address]    [address] [place], [place] [address]     [place], [place] [address] [phone]      [phone]  [facility]   [facility] [address]     [place], [place] [address]     [place], [place] [address] [phone]      [phone]  You were admitted for ingestion of rubbing alcohol and treated for the toxic effects of that.  You were also evaluated and treated for alcohol withdrawal syndrome.  We are discharging you now that your  condition has improved.  Talk to your doctor about alcohol use disorder and cutting back on your alcohol use if you are interested.  There are several ways that your doctor can help you, including with medicine.  Return to the hospital if you experience signs of severe alcohol withdrawal including, but not limited to: Hallucinations, seizures, unremitting nausea and vomiting, confusion, fevers, sweating, fast heart rate.  Nani Gasser MD 05/15/2022, 11:30 AM      Nani Gasser MD 05/15/2022, 11:35 AM

## 2022-05-15 NOTE — TOC Transition Note (Signed)
Transition of Care [facility]) - CM/SW Discharge Note   Patient Details  Name: Chase Parsons MRN: [id number] Date of Birth: [birth year]  Transition of Care [facility]) CM/SW Contact:  Milinda Antis, [place] Phone Number: 05/15/2022, 12:11 PM   Clinical Narrative:    Patient will DC to: home with self care  Anticipated DC date: 05/15/2022 Family notified: patient alert and oriented Transport by: Private vehicle   Per MD patient ready for DC home.  Per RN, patient will have family/friend to transport home.  Substance use resources have been added to the AVS.   CSW will sign off for now as social work intervention is no longer needed. Please consult [place] again if new needs arise.    Final next level of care: Home/Self Care Barriers to Discharge: Barriers Resolved   Patient Goals and CMS Choice      Discharge Placement                    Name of family member notified: patient alert and oriented Patient and family notified of of transfer: 05/15/22  Discharge Plan and Services Additional resources added to the After Visit Summary for                                       Social Determinants of Health (SDOH) Interventions SDOH Screenings   Food Insecurity: No Food Insecurity (05/14/2022)  Housing: Low Risk  (05/14/2022)  Transportation Needs: No Transportation Needs (05/14/2022)  Utilities: Not At Risk (05/14/2022)  Alcohol Screen: Medium Risk (07/15/2020)  Depression (PHQ2-9): Low Risk  (08/12/2021)  Financial Resource Strain: Low Risk  (07/15/2020)  Physical Activity: Inactive (07/15/2020)  Stress: Stress Concern Present (07/15/2020)  Tobacco Use: Low Risk  (03/10/2022)     Readmission Risk Interventions     No data to display

## 2022-05-15 NOTE — Progress Notes (Signed)
Mobility Specialist Progress Note   05/15/22 1030  Mobility  Activity Ambulated with assistance in hallway;Ambulated with assistance to bathroom;Dangled on edge of bed  Level of Assistance Standby assist, set-up cues, supervision of patient - no hands on  Assistive Device Other (Comment) (IV Pole)  Distance Ambulated (ft) 145 ft (25 + 125)  Range of Motion/Exercises Active;All extremities  Activity Response Tolerated well   Pre Mobility:  HR 115 During Mobility: HR 120+ Post Mobility: HR 120  Patient received in supine and agreeable to participate. Presented A&O x3, unsure of date and time and slightly lethargic. Still limited by deficits displaying slow processing, poor strength and coordination, poor attention span and safety awareness. Required cues to stay on task throughout session. Patient with improved activity tolerance compared to initial assessment. Ambulated to and from bathroom with supervision then returned to bed even after reminding patient we needed walk in the hallway. After long seated break and taking morning medication, patient then ambulated in hallway min guard to supervision. Returned to room without complaint or incident. Was left in dangling EOB with all needs met, call bell in reach.   Chase Parsons, BS EXP Mobility Specialist Please contact via SecureChat or Rehab office at [phone]

## 2022-05-18 ENCOUNTER — Encounter: Payer: Medicare Other | Admitting: Student

## 2022-05-18 ENCOUNTER — Ambulatory Visit (INDEPENDENT_AMBULATORY_CARE_PROVIDER_SITE_OTHER): Payer: Medicare Other | Admitting: Student

## 2022-05-18 ENCOUNTER — Telehealth: Payer: Self-pay

## 2022-05-18 ENCOUNTER — Ambulatory Visit (INDEPENDENT_AMBULATORY_CARE_PROVIDER_SITE_OTHER): Payer: Medicare Other | Admitting: *Deleted

## 2022-05-18 ENCOUNTER — Encounter: Payer: Self-pay | Admitting: Student

## 2022-05-18 VITALS — BP 153/97 | HR 97 | Temp 97.9°F | Ht 72.0 in | Wt 166.3 lb

## 2022-05-18 DIAGNOSIS — K7030 Alcoholic cirrhosis of liver without ascites: Secondary | ICD-10-CM | POA: Diagnosis not present

## 2022-05-18 DIAGNOSIS — F322 Major depressive disorder, single episode, severe without psychotic features: Secondary | ICD-10-CM | POA: Diagnosis not present

## 2022-05-18 DIAGNOSIS — E876 Hypokalemia: Secondary | ICD-10-CM

## 2022-05-18 DIAGNOSIS — Z0000 Encounter for general adult medical examination without abnormal findings: Secondary | ICD-10-CM | POA: Diagnosis not present

## 2022-05-18 DIAGNOSIS — T512X1A Toxic effect of 2-Propanol, accidental (unintentional), initial encounter: Secondary | ICD-10-CM

## 2022-05-18 DIAGNOSIS — I10 Essential (primary) hypertension: Secondary | ICD-10-CM | POA: Diagnosis not present

## 2022-05-18 DIAGNOSIS — F1010 Alcohol abuse, uncomplicated: Secondary | ICD-10-CM

## 2022-05-18 NOTE — Assessment & Plan Note (Signed)
Patient has history of hypertension managed at home with amlodipine, which he reports taking daily. Has a cuff at home, however, does not regularly check his blood pressure. Today in clinic, BP was 162/101 and repeat 153/97. May need to increase amlodipine dose at next visit, but will first want accurate record of his values at home to better inform our decision.  - Instruct daily measurement and recording of blood pressure at home - Return to clinic in three months, consider increasing amlodipine dose

## 2022-05-18 NOTE — Patient Instructions (Signed)
Health Maintenance, Male Adopting a healthy lifestyle and getting preventive care are important in promoting health and wellness. Ask your health care provider about: The right schedule for you to have regular tests and exams. Things you can do on your own to prevent diseases and keep yourself healthy. What should I know about diet, weight, and exercise? Eat a healthy diet  Eat a diet that includes plenty of vegetables, fruits, low-fat dairy products, and lean protein. Do not eat a lot of foods that are high in solid fats, added sugars, or sodium. Maintain a healthy weight Body mass index (BMI) is a measurement that can be used to identify possible weight problems. It estimates body fat based on height and weight. Your health care provider can help determine your BMI and help you achieve or maintain a healthy weight. Get regular exercise Get regular exercise. This is one of the most important things you can do for your health. Most adults should: Exercise for at least 150 minutes each week. The exercise should increase your heart rate and make you sweat (moderate-intensity exercise). Do strengthening exercises at least twice a week. This is in addition to the moderate-intensity exercise. Spend less time sitting. Even light physical activity can be beneficial. Watch cholesterol and blood lipids Have your blood tested for lipids and cholesterol at 67 years of age, then have this test every 5 years. You may need to have your cholesterol levels checked more often if: Your lipid or cholesterol levels are high. You are older than 67 years of age. You are at high risk for heart disease. What should I know about cancer screening? Many types of cancers can be detected early and may often be prevented. Depending on your health history and family history, you may need to have cancer screening at various ages. This may include screening for: Colorectal cancer. Prostate cancer. Skin cancer. Lung  cancer. What should I know about heart disease, diabetes, and high blood pressure? Blood pressure and heart disease High blood pressure causes heart disease and increases the risk of stroke. This is more likely to develop in people who have high blood pressure readings or are overweight. Talk with your health care provider about your target blood pressure readings. Have your blood pressure checked: Every 3-5 years if you are 18-39 years of age. Every year if you are 40 years old or older. If you are between the ages of 65 and 75 and are a current or former smoker, ask your health care provider if you should have a one-time screening for abdominal aortic aneurysm (AAA). Diabetes Have regular diabetes screenings. This checks your fasting blood sugar level. Have the screening done: Once every three years after age 45 if you are at a normal weight and have a low risk for diabetes. More often and at a younger age if you are overweight or have a high risk for diabetes. What should I know about preventing infection? Hepatitis B If you have a higher risk for hepatitis B, you should be screened for this virus. Talk with your health care provider to find out if you are at risk for hepatitis B infection. Hepatitis C Blood testing is recommended for: Everyone born from 1945 through 1965. Anyone with known risk factors for hepatitis C. Sexually transmitted infections (STIs) You should be screened each year for STIs, including gonorrhea and chlamydia, if: You are sexually active and are younger than 67 years of age. You are older than 67 years of age and your   health care provider tells you that you are at risk for this type of infection. Your sexual activity has changed since you were last screened, and you are at increased risk for chlamydia or gonorrhea. Ask your health care provider if you are at risk. Ask your health care provider about whether you are at high risk for HIV. Your health care provider  may recommend a prescription medicine to help prevent HIV infection. If you choose to take medicine to prevent HIV, you should first get tested for HIV. You should then be tested every 3 months for as long as you are taking the medicine. Follow these instructions at home: Alcohol use Do not drink alcohol if your health care provider tells you not to drink. If you drink alcohol: Limit how much you have to 0-2 drinks a day. Know how much alcohol is in your drink. In the [place] one drink equals one 12 oz bottle of beer (355 mL), one 5 oz glass of wine (148 mL), or one 1 oz glass of hard liquor (44 mL). Lifestyle Do not use any products that contain nicotine or tobacco. These products include cigarettes, chewing tobacco, and vaping devices, such as e-cigarettes. If you need help quitting, ask your health care provider. Do not use street drugs. Do not share needles. Ask your health care provider for help if you need support or information about quitting drugs. General instructions Schedule regular health, dental, and eye exams. Stay current with your vaccines. Tell your health care provider if: You often feel depressed. You have ever been abused or do not feel safe at home. Summary Adopting a healthy lifestyle and getting preventive care are important in promoting health and wellness. Follow your health care provider's instructions about healthy diet, exercising, and getting tested or screened for diseases. Follow your health care provider's instructions on monitoring your cholesterol and blood pressure. This information is not intended to replace advice given to you by your health care provider. Make sure you discuss any questions you have with your health care provider. Document Revised: 09/02/2020 Document Reviewed: 09/02/2020 Elsevier Patient Education  2023 Elsevier Inc.  

## 2022-05-18 NOTE — Patient Outreach (Signed)
  Care Coordination [facility] Note Transition Care Management Follow-up Telephone Call Date of discharge and from where: Chase Parsons 05/15/22 How have you been since you were released from the hospital? Per sister, Chase Parsons, says he is doing well.  Went to his doctors appointment Any questions or concerns? No  Items Reviewed: Did the pt receive and understand the discharge instructions provided? Yes  Medications obtained and verified? Yes  Other? No  Any new allergies since your discharge? No  Dietary orders reviewed? No Do you have support at home? Yes   Home Care and Equipment/Supplies: Were home health services ordered? not applicable If so, what is the name of the agency? N/a  Has the agency set up a time to come to the patient's home? not applicable Were any new equipment or medical supplies ordered?  No What is the name of the medical supply agency? N/A Were you able to get the supplies/equipment? not applicable Do you have any questions related to the use of the equipment or supplies? No  Functional Questionnaire: (I = Independent and D = Dependent) ADLs: I  Bathing/Dressing- I  Meal Prep- I  Eating- I  Maintaining continence- I  Transferring/Ambulation- I  Managing Meds- I  Follow up appointments reviewed:  PCP Hospital f/u appt confirmed? Yes  Scheduled to see Dr. Jodi Parsons on 05/18/22 @ 0915. [facility] f/u appt confirmed? No   Are transportation arrangements needed? No  If their condition worsens, is the pt aware to call PCP or go to the Emergency Dept.? Yes Was the patient provided with contact information for the PCP's office or ED? Yes Was to pt encouraged to call back with questions or concerns? Yes  SDOH assessments and interventions completed:   No SDOH Interventions Today    Flowsheet Row Most Recent Value  SDOH Interventions   Housing Interventions Intervention Not Indicated  Transportation Interventions Intervention Not Indicated       Care  Coordination Interventions:  No Care Coordination interventions needed at this time.   Encounter Outcome:  Pt. Visit Completed

## 2022-05-18 NOTE — Assessment & Plan Note (Signed)
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 He completed the thiamine today and continues to take folic acid daily. Since leaving the hospital, he has felt good though reports sleeping a lot. Denies confusion, dizziness, abdominal pain, and ingesting alcohol in any form since discharge. He acknowledges that abstinence from alcohol is important and has close friend support at home to help him achieve this goal. X  - Encourage alcohol cessation and commend current efforts - Continue vitaminB9 '1mg'$  q24

## 2022-05-18 NOTE — Patient Instructions (Signed)
  Thank you, Mr.[employer], for allowing [place] to provide your care today. Today we discussed . . .  > Hypertension       - continue to take your amlodipine daily       - please start to check your blood pressure at home and bring a record of these values to your next visit in three months       - schedule a visit to see your cardiologist, you have not seen them in a while > Alcoholism       - it is important for you to stay away from alcohol, which will adversely affect your health including your blood pressure       - continue to take your vitamin B9 supplements at home > Irritable bowel syndrome       - continue to take your rifaximin and regularly follow-up with your gastroenterologist    I have ordered the following labs for you:   Lab Orders         BMP8+Anion Gap       Tests ordered today:  none   Referrals ordered today:   Referral Orders  No referral(s) requested today      I have ordered the following medication/changed the following medications:   Stop the following medications: There are no discontinued medications.   Start the following medications: No orders of the defined types were placed in this encounter.     Follow up: 3 months    Remember:  Please continue to take your medications and start checking your blood pressure daily at home. Bring a record of these values with you to your next visit with [place]. We will call you about your lab results and see you again in about three months!   Should you have any questions or concerns please call the internal medicine clinic at [phone].     Roswell Nickel, MD [place]

## 2022-05-18 NOTE — Assessment & Plan Note (Signed)
Patient takes fluoxetine daily and has been for several years, first starting in 2016 per electronic medical record. His mood has improved significantly on this medication, and he is tolerating it well.  - Continue fluoxetine '10mg'$  q24

## 2022-05-18 NOTE — Assessment & Plan Note (Signed)
Patient has history of alcoholic cirrhosis, complicated by encephalopathy. Ultrasound performed 10-2021 notable for increased hepatic parenchymal echogenicity suggestive of steatosis. Follows with gastroenterologist Dr Alessandra Bevels, last visit 11-2021. Takes rifaximin at home, which has also helped decrease the frequency of his diarrhea.   - Continue rifaximin '550mg'$  q12 - Continue to follow with gastroenterology regularly

## 2022-05-18 NOTE — Assessment & Plan Note (Deleted)
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 He completed the thiamine today and continues to take folic acid daily. Since leaving the hospital, he has felt good though reports sleeping a lot. Denies confusion, dizziness, abdominal pain, and ingesting alcohol in any form since discharge. He acknowledges that abstinence from alcohol is important and has close friend support at home to help him achieve this goal. X  - Encourage alcohol cessation and commend current efforts - Continue vitaminB9 '1mg'$  q24
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
## 2022-05-18 NOTE — Assessment & Plan Note (Signed)
Patient's potassium on day of discharge 1-19 was 2.9. Magnesium was also low at 1.6. Repeat studies indicated to monitor levels of these two electrolytes.  - Check BMP, consider starting magnesium oxide and potassium chloride supplementation if values low

## 2022-05-19 ENCOUNTER — Other Ambulatory Visit: Payer: Self-pay | Admitting: Gastroenterology

## 2022-05-19 DIAGNOSIS — K7460 Unspecified cirrhosis of liver: Secondary | ICD-10-CM

## 2022-05-19 LAB — BMP8+ANION GAP
Anion Gap: 22 mmol/L — ABNORMAL HIGH (ref 10.0–18.0)
BUN/Creatinine Ratio: 12 (ref 10–24)
BUN: 8 mg/dL (ref 8–27)
CO2: 22 mmol/L (ref 20–29)
Calcium: 10 mg/dL (ref 8.6–10.2)
Chloride: 93 mmol/L — ABNORMAL LOW (ref 96–106)
Creatinine, Ser: 0.65 mg/dL — ABNORMAL LOW (ref 0.76–1.27)
Glucose: 77 mg/dL (ref 70–99)
Potassium: 4 mmol/L (ref 3.5–5.2)
Sodium: 137 mmol/L (ref 134–144)
eGFR: 104 mL/min/{1.73_m2} (ref >59)

## 2022-05-25 NOTE — Progress Notes (Signed)
Internal Medicine Clinic Attending  Case discussed with the resident at the time of the visit.  We reviewed the resident's history and exam and pertinent patient test results.  I agree with the assessment, diagnosis, and plan of care documented in the resident's note.  

## 2022-05-28 NOTE — Progress Notes (Signed)
Internal Medicine Clinic Attending  Case and documentation reviewed.  I reviewed the AWV findings.  I agree with the assessment, diagnosis, and plan of care documented in the AWV note.     

## 2022-06-09 ENCOUNTER — Other Ambulatory Visit (HOSPITAL_COMMUNITY): Payer: Self-pay

## 2022-06-11 ENCOUNTER — Other Ambulatory Visit: Payer: Medicare Other

## 2022-06-15 ENCOUNTER — Ambulatory Visit
Admission: RE | Admit: 2022-06-15 | Discharge: 2022-06-15 | Disposition: A | Discharge status: Home / Self Care | Payer: Medicare Other | Source: Ambulatory Visit | Attending: Gastroenterology | Admitting: Gastroenterology

## 2022-06-15 DIAGNOSIS — K7460 Unspecified cirrhosis of liver: Secondary | ICD-10-CM

## 2022-07-02 ENCOUNTER — Other Ambulatory Visit: Payer: Self-pay | Admitting: Internal Medicine

## 2022-07-02 DIAGNOSIS — F322 Major depressive disorder, single episode, severe without psychotic features: Secondary | ICD-10-CM

## 2022-07-12 ENCOUNTER — Other Ambulatory Visit (HOSPITAL_COMMUNITY): Payer: Self-pay

## 2022-07-13 ENCOUNTER — Other Ambulatory Visit (HOSPITAL_COMMUNITY): Payer: Self-pay

## 2022-07-15 ENCOUNTER — Other Ambulatory Visit (HOSPITAL_COMMUNITY): Payer: Self-pay

## 2022-07-25 ENCOUNTER — Emergency Department (HOSPITAL_COMMUNITY)
Admission: EM | Admit: 2022-07-25 | Discharge: 2022-07-25 | Disposition: A | Discharge status: Home / Self Care | Payer: Medicare Other | Attending: Emergency Medicine | Admitting: Emergency Medicine

## 2022-07-25 ENCOUNTER — Emergency Department (HOSPITAL_COMMUNITY): Payer: Medicare Other

## 2022-07-25 DIAGNOSIS — M546 Pain in thoracic spine: Secondary | ICD-10-CM | POA: Diagnosis not present

## 2022-07-25 DIAGNOSIS — W228XXA Striking against or struck by other objects, initial encounter: Secondary | ICD-10-CM | POA: Insufficient documentation

## 2022-07-25 DIAGNOSIS — S3991XA Unspecified injury of abdomen, initial encounter: Secondary | ICD-10-CM | POA: Diagnosis present

## 2022-07-25 DIAGNOSIS — S22000A Wedge compression fracture of unspecified thoracic vertebra, initial encounter for closed fracture: Secondary | ICD-10-CM | POA: Diagnosis not present

## 2022-07-25 LAB — COMPREHENSIVE METABOLIC PANEL
ALT: 22 U/L (ref 0–44)
AST: 29 U/L (ref 15–41)
Albumin: 4.2 g/dL (ref 3.5–5.0)
Alkaline Phosphatase: 118 U/L (ref 38–126)
Anion gap: 12 (ref 5–15)
BUN: 8 mg/dL (ref 8–23)
CO2: 27 mmol/L (ref 22–32)
Calcium: 10.1 mg/dL (ref 8.9–10.3)
Chloride: 99 mmol/L (ref 98–111)
Creatinine, Ser: 0.95 mg/dL (ref 0.61–1.24)
GFR, Estimated: >60 mL/min (ref >60)
Glucose, Bld: 101 mg/dL — ABNORMAL HIGH (ref 70–99)
Potassium: 3.8 mmol/L (ref 3.5–5.1)
Sodium: 138 mmol/L (ref 135–145)
Total Bilirubin: 0.6 mg/dL (ref 0.3–1.2)
Total Protein: 9.1 g/dL — ABNORMAL HIGH (ref 6.5–8.1)

## 2022-07-25 LAB — CBC WITH DIFFERENTIAL/PLATELET
Abs Immature Granulocytes: 0.02 10*3/uL (ref 0.00–0.07)
Basophils Absolute: 0.1 10*3/uL (ref 0.0–0.1)
Basophils Relative: 1 %
Eosinophils Absolute: 0.2 10*3/uL (ref 0.0–0.5)
Eosinophils Relative: 2 %
HCT: 39.3 % (ref 39.0–52.0)
Hemoglobin: 13.1 g/dL (ref 13.0–17.0)
Immature Granulocytes: 0 %
Lymphocytes Relative: 26 %
Lymphs Abs: 2 10*3/uL (ref 0.7–4.0)
MCH: 32.2 pg (ref 26.0–34.0)
MCHC: 33.3 g/dL (ref 30.0–36.0)
MCV: 96.6 fL (ref 80.0–100.0)
Monocytes Absolute: 0.7 10*3/uL (ref 0.1–1.0)
Monocytes Relative: 9 %
Neutro Abs: 4.8 10*3/uL (ref 1.7–7.7)
Neutrophils Relative %: 62 %
Platelets: 289 10*3/uL (ref 150–400)
RBC: 4.07 MIL/uL — ABNORMAL LOW (ref 4.22–5.81)
RDW: 12.2 % (ref 11.5–15.5)
WBC: 7.7 10*3/uL (ref 4.0–10.5)
nRBC: 0 % (ref 0.0–0.2)

## 2022-07-25 LAB — LIPASE, BLOOD: Lipase: 24 U/L (ref 11–51)

## 2022-07-25 MED ORDER — HYDROCODONE-ACETAMINOPHEN 5-325 MG PO TABS: 1.0000 | ORAL_TABLET | Freq: Four times a day (QID) | ORAL | 0 refills | Status: DC | PRN

## 2022-07-25 MED ORDER — MORPHINE SULFATE (PF) 4 MG/ML IV SOLN
4.0000 mg | Freq: Once | INTRAVENOUS | Status: AC
  Administered 2022-07-25: 4 mg via INTRAVENOUS
  Filled 2022-07-25: qty 1

## 2022-07-25 MED ORDER — SODIUM CHLORIDE 0.9 % IV BOLUS
500.0000 mL | Freq: Once | INTRAVENOUS | Status: AC
  Administered 2022-07-25: 500 mL via INTRAVENOUS

## 2022-07-25 MED ORDER — IOHEXOL 350 MG/ML SOLN
75.0000 mL | Freq: Once | INTRAVENOUS | Status: AC | PRN
  Administered 2022-07-25: 75 mL via INTRAVENOUS

## 2022-07-25 NOTE — Progress Notes (Signed)
Orthopedic Tech Progress Note Patient Details:  Chase Parsons Jul 18, 1955 [id number]  Ortho Devices Type of Ortho Device: Thoracolumbar corset (TLSO) Ortho Device/Splint Interventions: Ordered, Application, Adjustment   Post Interventions Patient Tolerated: Well Instructions Provided: Care of device, Adjustment of device  Karolee Stamps 07/25/2022, 10:22 PM

## 2022-07-25 NOTE — Discharge Instructions (Addendum)
You have been seen and discharged from the emergency department.  You were found to have a compression fracture of T12.  Neurosurgery was consulted, they recommended back brace and outpatient follow-up in the office.  You have been prescribed pain medicine.  Do not mix this medication with alcohol or other sedating medications. Do not drive or do heavy physical activity until you know how this medication affects you.  It may cause drowsiness.  Take home medications as prescribed. If you have any worsening symptoms or further concerns for your health please return to an emergency department for further evaluation.

## 2022-07-25 NOTE — ED Provider Notes (Signed)
[facility] Provider Note   CSN: [id number] Arrival date & time: 07/25/22  1433     History  Chief Complaint  Patient presents with   Chase Parsons is a 67 y.o. male.  HPI   67 year old male presents the emergency department with concern for left-sided abdominal pain.  Patient describes 4 days ago having a fall.  He mentioned that he was moving furniture out of a friend's truck.  He states that he fell back onto his bottom/back.  He does not believe any furniture fell on his abdomen but he states that that is when the pain started.  Since then it has been constant.  No associated nausea/vomiting/diarrhea/constipation.  Denies any fever or acute change in his health, has otherwise been compliant with medications.  Home Medications Prior to Admission medications   Medication Sig Start Date End Date Taking? Authorizing Provider  amLODipine (NORVASC) 5 MG tablet Take 1 tablet (5 mg total) by mouth daily. 10/24/21   Atway, Rayann N, DO  FLUoxetine (PROZAC) 10 MG capsule Take 1 capsule by mouth once daily 07/03/22   Atway, Rayann N, DO  folic acid (FOLVITE) 1 MG tablet Take 1 tablet (1 mg total) by mouth daily. 05/16/22   Nani Gasser, MD  Multiple Vitamin (MULTIVITAMIN) tablet Take 1 tablet by mouth daily.    [provider]  thiamine (VITAMIN B1) 100 MG tablet Take 1 tablet (100 mg total) by mouth daily. 05/16/22   Nani Gasser, MD  XIFAXAN 550 MG TABS tablet Take 1 tablet by mouth in the morning and at bedtime. 10/24/20   [provider]      Allergies    Patient has no known allergies.    Review of Systems   Review of Systems  Constitutional:  Positive for fatigue. Negative for fever.  Respiratory:  Negative for shortness of breath.   Cardiovascular:  Negative for chest pain.  Gastrointestinal:  Positive for abdominal pain. Negative for abdominal distention, anal bleeding, diarrhea, nausea and  vomiting.  Skin:  Negative for rash.  Neurological:  Negative for headaches.    Physical Exam Updated Vital Signs BP 128/84   Pulse 99   Resp 17  Physical Exam Vitals and nursing note reviewed.  Constitutional:      General: He is not in acute distress.    Appearance: Normal appearance.  HENT:     Head: Normocephalic.     Mouth/Throat:     Mouth: Mucous membranes are moist.  Cardiovascular:     Rate and Rhythm: Normal rate.  Pulmonary:     Effort: Pulmonary effort is normal. No respiratory distress.  Abdominal:     General: There is no distension.     Palpations: Abdomen is soft.     Tenderness: There is abdominal tenderness. There is guarding. There is no rebound.     Comments: TTP mid and left abdomen  Musculoskeletal:     Comments: Mild paraspinal tenderness to palpation of the mid to lower back  Skin:    General: Skin is warm.  Neurological:     Mental Status: He is alert and oriented to person, place, and time. Mental status is at baseline.  Psychiatric:        Mood and Affect: Mood normal.     ED Results / Procedures / Treatments   Labs (all labs ordered are listed, but only abnormal results are displayed) Labs Reviewed  CBC WITH DIFFERENTIAL/PLATELET  COMPREHENSIVE  METABOLIC PANEL  LIPASE, BLOOD    EKG None  Radiology No results found.  Procedures Procedures    Medications Ordered in ED Medications  morphine (PF) 4 MG/ML injection 4 mg (has no administration in time range)  sodium chloride 0.9 % bolus 500 mL (has no administration in time range)    ED Course/ Medical Decision Making/ A&P                             Medical Decision Making Amount and/or Complexity of Data Reviewed Labs: ordered. Radiology: ordered.  Risk Prescription drug management.   67 year old male presents emergency department after a fall 4 days ago.  Complaining of abdominal pain.  Abdomen is diffusely mildly tender on exam but so is the mid to lower back on  palpation.  Blood work is baseline for the patient.  CT imaging identifies what appears to be an acute T12 fracture with 50% height loss and some retropulsion.  Patient is neurologically intact.  Consulted neurosurgery who recommends TLSO brace and outpatient follow-up.  On reevaluation patient feels better, is currently pain controlled.  Will plan for TLSO brace and outpatient follow-up with neurosurgery along with pain control.  No other acute findings in the abdomen.  Patient at this time appears safe and stable for discharge and close outpatient follow up. Discharge plan and strict return to ED precautions discussed, patient verbalizes understanding and agreement.        Final Clinical Impression(s) / ED Diagnoses Final diagnoses:  None    Rx / DC Orders ED Discharge Orders     None         Lorelle Gibbs, DO 07/25/22 2122

## 2022-07-25 NOTE — ED Notes (Signed)
Ortho notified about TLSO

## 2022-07-25 NOTE — ED Notes (Signed)
Pt ambulated to the bathroom. Pt's gait is steady and is able to walk independently.

## 2022-07-25 NOTE — ED Notes (Signed)
Pt transported to CT ?

## 2022-07-25 NOTE — ED Triage Notes (Signed)
Patient here for evaluation of diffuse abdominal pain that started one week ago after falling and landing on his abdomen. Patient is alert, oriented, and in no apparent distress at this time.

## 2022-07-28 ENCOUNTER — Telehealth: Payer: Self-pay

## 2022-07-28 NOTE — Telephone Encounter (Signed)
     Patient  visit on 07/25/2022  at [facility]. [facility] was for fall.  Have you been able to follow up with your primary care physician? Yes  The patient was or was not able to obtain any needed medicine or equipment. Patient was able to obtain medication.  Are there diet recommendations that you are having difficulty following? No  Patient expresses understanding of discharge instructions and education provided has no other needs at this time. Yes   [facility] Resource Care Guide   [email or web address]  ?? [id number]   Website: [email or web address]  [email or web address]

## 2022-07-29 ENCOUNTER — Other Ambulatory Visit (HOSPITAL_COMMUNITY): Payer: Self-pay

## 2022-09-11 IMAGING — CT CT ANGIO CHEST-ABD-PELV FOR DISSECTION W/ AND WO/W CM
2 of 7 series · 13 of 46 positions shown, 15 images · IV contrast (OMNI 350)
Comparison: February 22, 2020

CLINICAL DATA: Chest pain and back pain.

EXAM:
CT ANGIOGRAPHY CHEST, ABDOMEN AND PELVIS
TECHNIQUE: Non-contrast CT of the chest was initially obtained.

[Series 7: dissection 2mm · axial · 0.76mm/px · z∈[+906,+1488]mm · 10 of 329 slices shown, 12 images]
[im 19/329  soft-tissue]
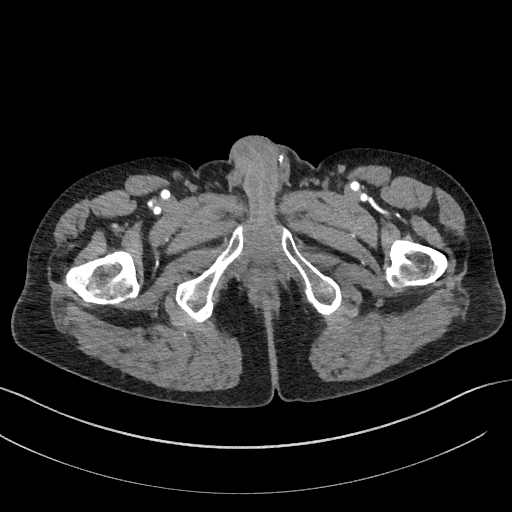
[im 19/329  bone]
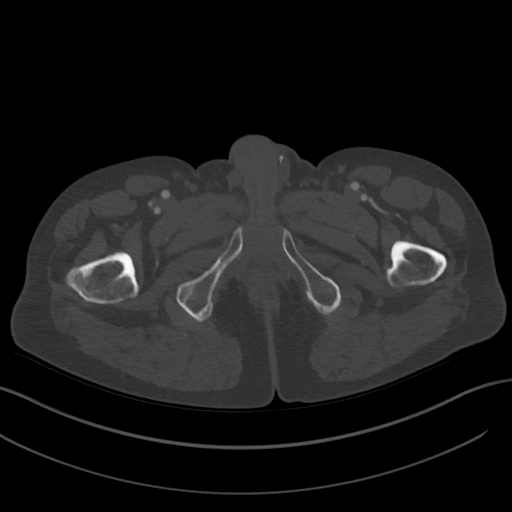
[im 55/329  soft-tissue]
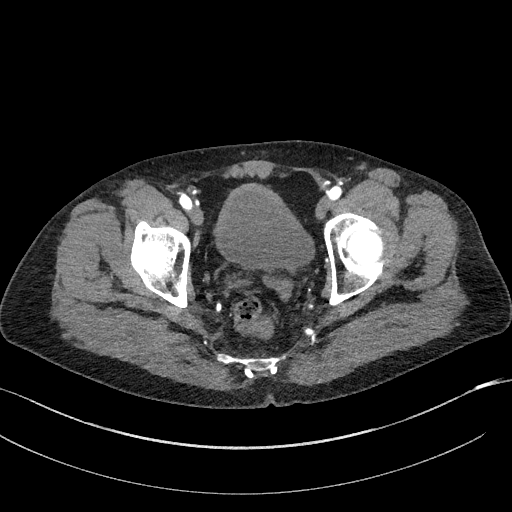
[im 92/329  soft-tissue]
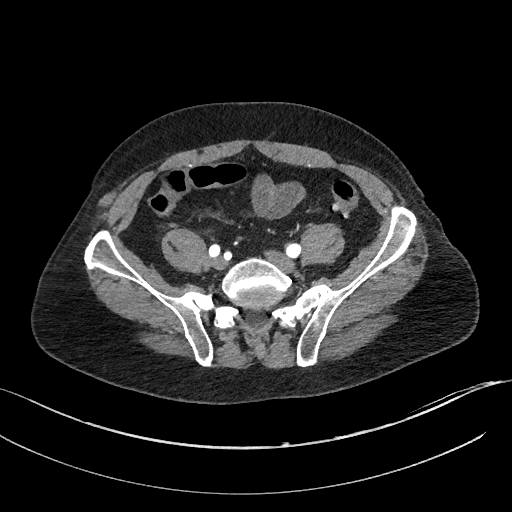
[im 110/329  soft-tissue]
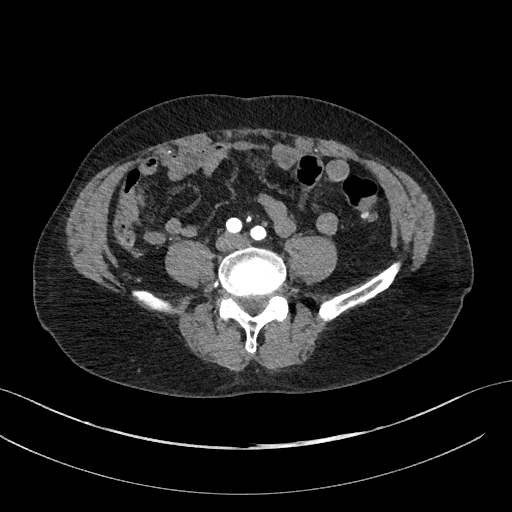
[im 146/329  soft-tissue]
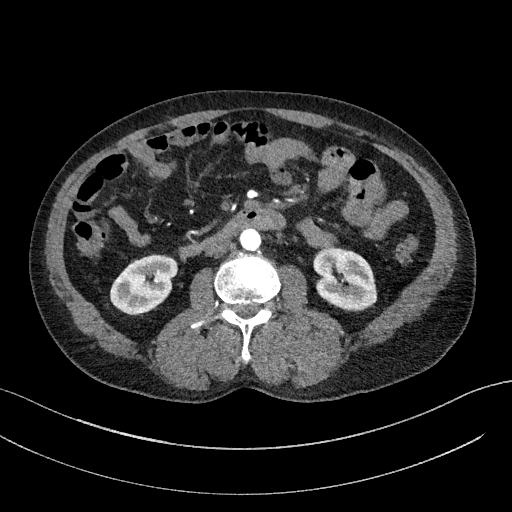
[im 183/329  soft-tissue]
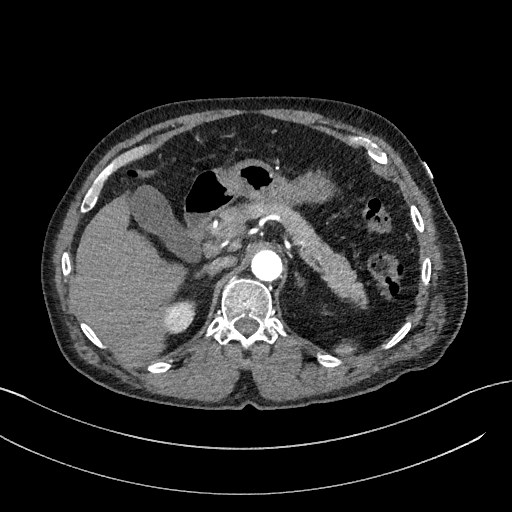
[im 219/329  soft-tissue]
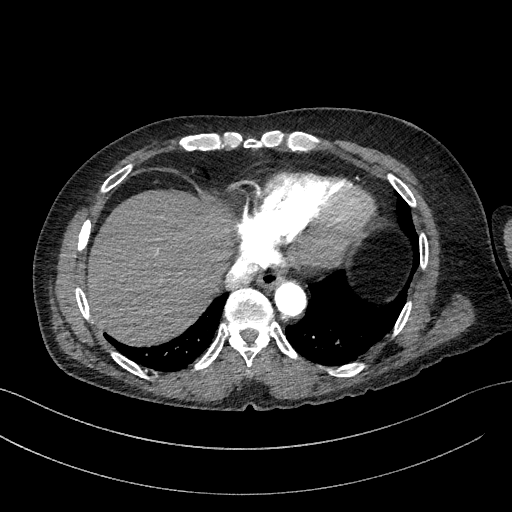
[im 237/329  soft-tissue]
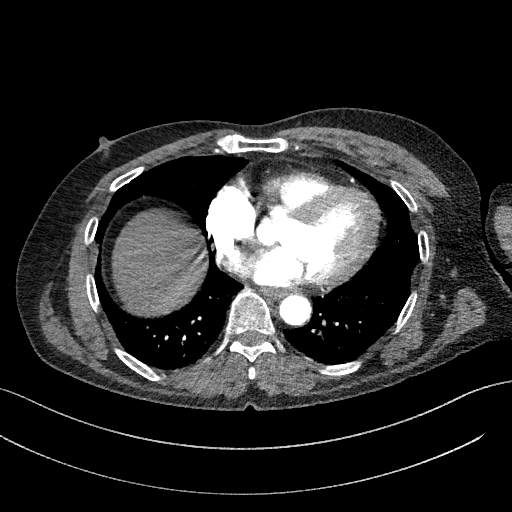
[im 274/329  soft-tissue]
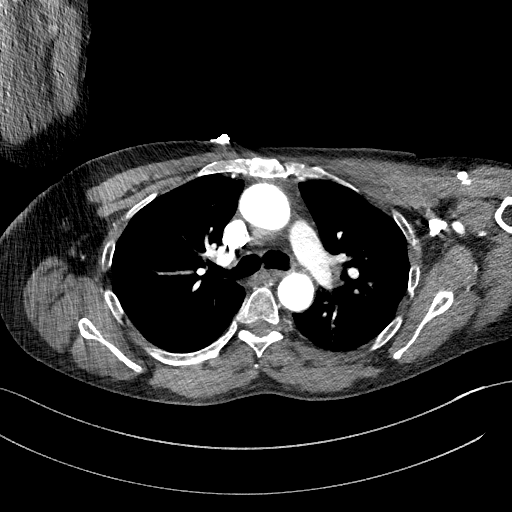
[im 274/329  bone]
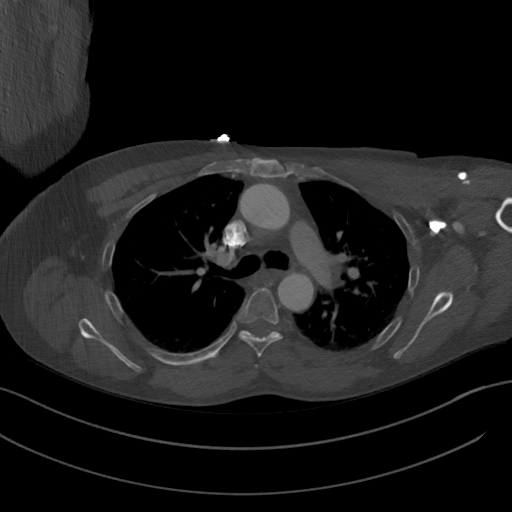
[im 310/329  soft-tissue]
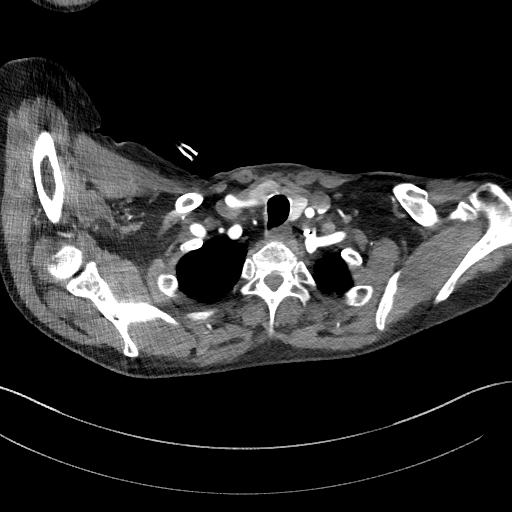

[Series 10: dissection 2mm cor · coronal · 0.77mm/px · 3 of 149 slices shown]
[im 38/149  soft-tissue]
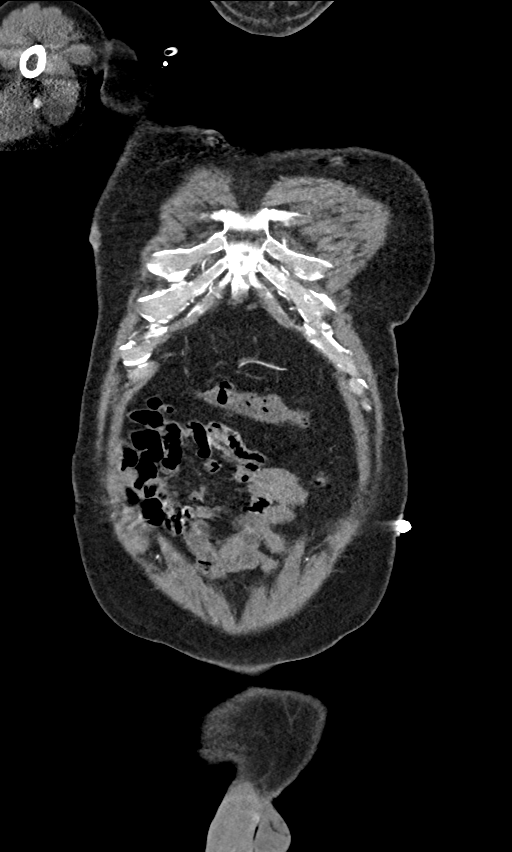
[im 75/149  soft-tissue]
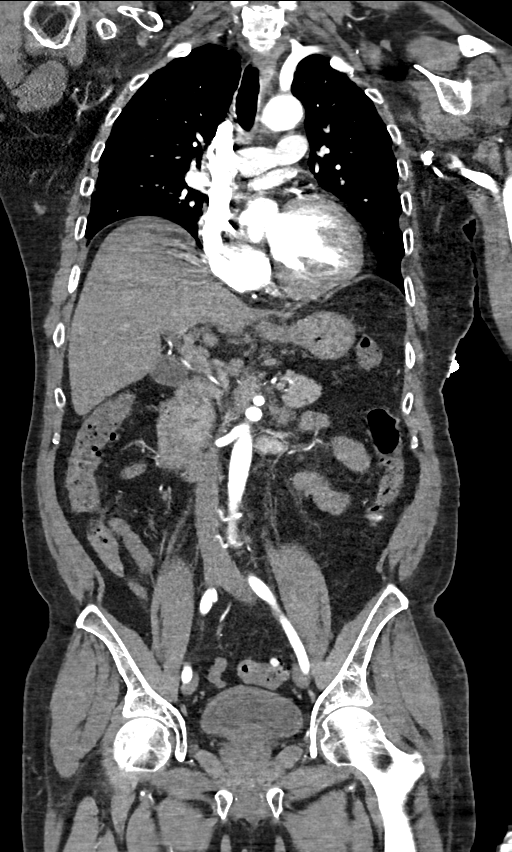
[im 112/149  soft-tissue]
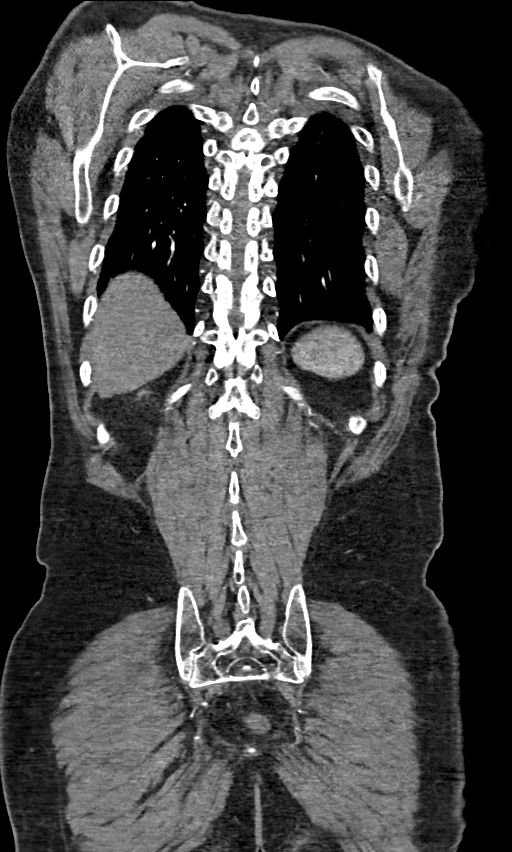

[13 of 46 positions shown; findings below may reference images not displayed]

Multidetector CT imaging through the chest, abdomen and pelvis was
performed using the standard protocol during bolus administration of
intravenous contrast. Multiplanar reconstructed images and MIPs were
obtained and reviewed to evaluate the vascular anatomy.

CONTRAST:  80mL OMNIPAQUE IOHEXOL 350 MG/ML SOLN
FINDINGS: CTA CHEST FINDINGS

Cardiovascular: Very mild calcification of the aortic arch is seen
without evidence of aortic aneurysm or dissection. Satisfactory
opacification of the pulmonary arteries to the segmental level. No
evidence of pulmonary embolism. Normal heart size with moderate
marked severity coronary artery calcification. No pericardial
effusion.

Mediastinum/Nodes: No enlarged mediastinal, hilar, or axillary lymph
nodes. Thyroid gland, trachea, and esophagus demonstrate no
significant findings.

Lungs/Pleura: Lungs are clear. No pleural effusion or pneumothorax.

Musculoskeletal: No chest wall abnormality. No acute or significant
osseous findings.

Review of the MIP images confirms the above findings.

CTA ABDOMEN AND PELVIS FINDINGS

VASCULAR

Aorta: Normal caliber aorta without aneurysm, dissection, vasculitis
or significant stenosis.

Celiac: Patent without evidence of aneurysm, dissection, vasculitis
or significant stenosis.

SMA: Patent without evidence of aneurysm, dissection, vasculitis or
significant stenosis.

Renals: Both renal arteries are patent without evidence of aneurysm,
dissection, vasculitis, fibromuscular dysplasia or significant
stenosis.

IMA: Patent without evidence of aneurysm, dissection, vasculitis or
significant stenosis.

Inflow: Patent without evidence of aneurysm, dissection, vasculitis
or significant stenosis.

Veins: No obvious venous abnormality within the limitations of this
arterial phase study.

Review of the MIP images confirms the above findings.

NON-VASCULAR

Hepatobiliary: The liver is cirrhotic in appearance. No focal liver
abnormality is seen. No gallstones, gallbladder wall thickening, or
biliary dilatation.

Pancreas: Unremarkable. No pancreatic ductal dilatation or
surrounding inflammatory changes.

Spleen: Normal in size without focal abnormality.

Adrenals/Urinary Tract: Adrenal glands are unremarkable. Kidneys are
normal, without focal lesions or hydronephrosis. 4 mm nonobstructing
renal stones are seen within the mid and upper right kidney, as well
as the posterior aspect of the mid to lower left kidney. Mild,
predominant stable urinary bladder wall thickening is noted.

Stomach/Bowel: Stomach is within normal limits. Appendix appears
normal. No evidence of bowel wall thickening, distention, or
inflammatory changes. Noninflamed diverticula are seen throughout
the large bowel.

Lymphatic: No abnormal abdominal or pelvic lymph nodes are seen.

Reproductive: Mild to moderate severity prostate gland enlargement
is noted.

Other: Small, stable bilateral fat containing inguinal hernias are
noted.

There is no evidence of abdominal or pelvic free fluid.

Musculoskeletal: A stable, benign-appearing 8 mm sclerotic focus is
seen within the L2 vertebral body.

No acute osseous abnormalities are identified.

Review of the MIP images confirms the above findings.
IMPRESSION: 1. No evidence of aortic aneurysm or dissection.
2. Moderate marked severity coronary artery calcification.
3. Cirrhotic liver.
4. Bilateral nonobstructing renal stones.
5. Colonic diverticulosis.
6. Stable, benign-appearing 8 mm sclerotic focus within the L2
vertebral body.
7. Aortic atherosclerosis.

Aortic Atherosclerosis ([id number]).

## 2022-10-01 ENCOUNTER — Other Ambulatory Visit: Payer: Self-pay | Admitting: Internal Medicine

## 2022-11-26 ENCOUNTER — Other Ambulatory Visit: Payer: Self-pay | Admitting: Gastroenterology

## 2022-11-26 DIAGNOSIS — K7469 Other cirrhosis of liver: Secondary | ICD-10-CM

## 2022-12-04 ENCOUNTER — Ambulatory Visit
Admission: RE | Admit: 2022-12-04 | Discharge: 2022-12-04 | Disposition: A | Discharge status: Home / Self Care | Payer: Medicare Other | Source: Ambulatory Visit | Attending: Gastroenterology | Admitting: Gastroenterology

## 2022-12-04 DIAGNOSIS — K7469 Other cirrhosis of liver: Secondary | ICD-10-CM

## 2022-12-09 ENCOUNTER — Other Ambulatory Visit (HOSPITAL_COMMUNITY): Payer: Self-pay

## 2022-12-27 ENCOUNTER — Other Ambulatory Visit: Payer: Self-pay | Admitting: Internal Medicine

## 2022-12-27 DIAGNOSIS — F322 Major depressive disorder, single episode, severe without psychotic features: Secondary | ICD-10-CM

## 2023-03-27 ENCOUNTER — Other Ambulatory Visit: Payer: Self-pay | Admitting: Internal Medicine

## 2023-04-08 ENCOUNTER — Encounter: Payer: Self-pay | Admitting: Internal Medicine

## 2023-04-08 DIAGNOSIS — Z860101 Personal history of adenomatous and serrated colon polyps: Secondary | ICD-10-CM | POA: Insufficient documentation

## 2023-05-04 ENCOUNTER — Encounter: Payer: Self-pay | Admitting: Internal Medicine

## 2023-05-04 ENCOUNTER — Ambulatory Visit (INDEPENDENT_AMBULATORY_CARE_PROVIDER_SITE_OTHER): Payer: Medicare Other | Admitting: Internal Medicine

## 2023-05-04 VITALS — BP 150/85 | HR 92 | Temp 98.1°F | Ht 72.0 in | Wt 169.0 lb

## 2023-05-04 DIAGNOSIS — K7030 Alcoholic cirrhosis of liver without ascites: Secondary | ICD-10-CM | POA: Diagnosis present

## 2023-05-04 DIAGNOSIS — I10 Essential (primary) hypertension: Secondary | ICD-10-CM | POA: Diagnosis not present

## 2023-05-04 DIAGNOSIS — F322 Major depressive disorder, single episode, severe without psychotic features: Secondary | ICD-10-CM

## 2023-05-04 MED ORDER — AMLODIPINE BESYLATE 10 MG PO TABS: 10.0000 mg | ORAL_TABLET | Freq: Every day | ORAL | 3 refills | Status: DC

## 2023-05-04 NOTE — Assessment & Plan Note (Signed)
 BP today is 150/85, similar on recheck. Patient is adherent to his amlodipine 5mg  which he took this morning.  - Increase to amlodipine 10mg  daily - Return in 2 weeks for nurse-only BP recheck

## 2023-05-04 NOTE — Assessment & Plan Note (Signed)
 Feels his mood is stable on current dose of Prozac 10mg . PHQ9 score is 6 today. No changes to plan.

## 2023-05-04 NOTE — Patient Instructions (Signed)
 Mr Cofer,   It was pleasure meeting you today.   I am checking a couple blood levels today.   I am also increasing your amlodipine  to 10mg  daily. Please take two of your 5mg  pills, and your next refill will be for 10mg  pills. We will plan to see you again in about two weeks for a blood pressure only visit.   Thanks,  Dr Francella

## 2023-05-04 NOTE — Assessment & Plan Note (Signed)
 Followed by GI for management of cirrhosis. He has been taking his Xifxan without any problems. Recent RUQ ultrasound negative for HCC. He has an upcoming GI appointment next week. Reports drinking 1-2 40oz beers daily, decreased from 3-4 40oz beers.  - CBC, CMP today

## 2023-05-04 NOTE — Progress Notes (Signed)
    Subjective:  CC: routine visit  HPI:  [name] is a 68 y.o. male with a past medical history stated below and presents today for above. Please see problem based assessment and plan for additional details.  Past Medical History:  Diagnosis Date   Alcohol abuse    Alcohol use disorder, moderate, dependence (HCC) 10/24/2019   CAD in native artery 02/25/2021   Encephalopathy    Hyponatremia 10/24/2019   Pneumonia    Severe single current episode of major depressive disorder, without psychotic features (HCC) 01/26/2015   Suicidal thoughts     Current Outpatient Medications on File Prior to Visit  Medication Sig Dispense Refill   FLUoxetine  (PROZAC ) 10 MG capsule Take 1 capsule by mouth once daily 90 capsule 1   folic acid  (FOLVITE ) 1 MG tablet Take 1 tablet (1 mg total) by mouth daily. 30 tablet 2   Multiple Vitamin (MULTIVITAMIN) tablet Take 1 tablet by mouth daily.     thiamine  (VITAMIN B1) 100 MG tablet Take 1 tablet (100 mg total) by mouth daily. 3 tablet 0   XIFAXAN  550 MG TABS tablet Take 1 tablet by mouth in the morning and at bedtime.     No current facility-administered medications on file prior to visit.    Review of Systems: ROS negative except for as is noted on the assessment and plan.  Objective:   Vitals:   05/04/23 0951 05/04/23 1008  BP: (!) 154/86 (!) 150/85  Pulse: 91 92  Temp: 98.1 F (36.7 C)   TempSrc: Oral   SpO2: 96%   Weight: 169 lb (76.7 kg)   Height: 6' (1.829 m)     Physical Exam: Constitutional: well-appearing, in no acute distress Cardiovascular: regular rate and rhythm Pulmonary/Chest: normal work of breathing on room air MSK: normal bulk and tone  Assessment & Plan:   Hypertension BP today is 150/85, similar on recheck. Patient is adherent to his amlodipine  5mg  which he took this morning.  - Increase to amlodipine  10mg  daily - Return in 2 weeks for nurse-only BP recheck  Alcoholic cirrhosis (HCC) Followed by GI for  management of cirrhosis. He has been taking his Xifxan without any problems. Recent RUQ ultrasound negative for HCC. He has an upcoming GI appointment next week. Reports drinking 1-2 40oz beers daily, decreased from 3-4 40oz beers.  - CBC, CMP today  MDD (major depressive disorder), single episode, severe , no psychosis (HCC) Feels his mood is stable on current dose of Prozac  10mg . PHQ9 score is 6 today. No changes to plan.     Patient discussed with Dr. Lovie Redell Burnet MD [facility]  PGY-[email or web address] Pager: [phone] Date 05/04/2023  Time 11:25 AM

## 2023-05-05 LAB — CMP14 + ANION GAP
ALT: 15 [IU]/L (ref 0–44)
AST: 19 [IU]/L (ref 0–40)
Albumin: 4.8 g/dL (ref 3.9–4.9)
Alkaline Phosphatase: 116 [IU]/L (ref 44–121)
Anion Gap: 18 mmol/L (ref 10.0–18.0)
BUN/Creatinine Ratio: 8 — ABNORMAL LOW (ref 10–24)
BUN: 6 mg/dL — ABNORMAL LOW (ref 8–27)
Bilirubin Total: 0.4 mg/dL (ref 0.0–1.2)
CO2: 24 mmol/L (ref 20–29)
Calcium: 9.9 mg/dL (ref 8.6–10.2)
Chloride: 97 mmol/L (ref 96–106)
Creatinine, Ser: 0.79 mg/dL (ref 0.76–1.27)
Globulin, Total: 3.5 g/dL (ref 1.5–4.5)
Glucose: 110 mg/dL — ABNORMAL HIGH (ref 70–99)
Potassium: 3.9 mmol/L (ref 3.5–5.2)
Sodium: 139 mmol/L (ref 134–144)
Total Protein: 8.3 g/dL (ref 6.0–8.5)
eGFR: 97 mL/min/{1.73_m2} (ref >59)

## 2023-05-05 LAB — CBC
Hematocrit: 40.6 % (ref 37.5–51.0)
Hemoglobin: 13.3 g/dL (ref 13.0–17.7)
MCH: 32.4 pg (ref 26.6–33.0)
MCHC: 32.8 g/dL (ref 31.5–35.7)
MCV: 99 fL — ABNORMAL HIGH (ref 79–97)
Platelets: 247 10*3/uL (ref 150–450)
RBC: 4.11 x10E6/uL — ABNORMAL LOW (ref 4.14–5.80)
RDW: 11.5 % — ABNORMAL LOW (ref 11.6–15.4)
WBC: 8 10*3/uL (ref 3.4–10.8)

## 2023-05-05 NOTE — Progress Notes (Signed)
 Internal Medicine Clinic Attending  Case discussed with the resident at the time of the visit.  We reviewed the resident's history and exam and pertinent patient test results.  I agree with the assessment, diagnosis, and plan of care documented in the resident's note.

## 2023-05-05 NOTE — Addendum Note (Signed)
 Addended by: Derrek Monaco on: 05/05/2023 09:53 AM   Modules accepted: Level of Service

## 2023-05-18 ENCOUNTER — Ambulatory Visit: Payer: Medicare Other | Admitting: *Deleted

## 2023-05-18 NOTE — Progress Notes (Signed)
.     Chase Parsons presented today for blood pressure check. Patient is prescribed blood pressure medications and I confirmed that patient did take their blood pressure medication prior to today's appointment. Blood pressure was taken in the usual and appropriate manner using an automated BP cuff.     Vitals:   05/18/23 1017  BP: (!) 134/90     Results of today's visit will be routed to Dr. Carlynn Purl for review and further management.

## 2023-06-24 ENCOUNTER — Other Ambulatory Visit: Payer: Self-pay | Admitting: Internal Medicine

## 2023-06-24 DIAGNOSIS — F322 Major depressive disorder, single episode, severe without psychotic features: Secondary | ICD-10-CM

## 2023-07-07 ENCOUNTER — Ambulatory Visit: Payer: Medicare Other

## 2023-07-07 VITALS — Ht 72.0 in | Wt 169.0 lb

## 2023-07-07 DIAGNOSIS — Z0000 Encounter for general adult medical examination without abnormal findings: Secondary | ICD-10-CM

## 2023-07-07 NOTE — Patient Instructions (Signed)
 Chase Parsons , Thank you for taking time to come for your Medicare Wellness Visit. I appreciate your ongoing commitment to your health goals. Please review the following plan we discussed and let me know if I can assist you in the future.   Referrals/Orders/Follow-Ups/Clinician Recommendations: Yes; Keep maintaining your health by keeping your appointments with Dr. Candida Peeling and any specialists that you may see.  Call us if you need anything.  Have a great year!!!!  This is a list of the screening recommended for you and due dates:  Health Maintenance  Topic Date Due   COVID-19 Vaccine (4 - 2024-25 season) 12/27/2022   Zoster (Shingles) Vaccine (2 of 2) 08/13/2023   Medicare Annual Wellness Visit  07/06/2024   DTaP/Tdap/Td vaccine (2 - Td or Tdap) 12/11/2027   Colon Cancer Screening  02/19/2030   Pneumonia Vaccine  Completed   Flu Shot  Completed   Hepatitis C Screening  Completed   HPV Vaccine  Aged Out    Advanced directives: (Declined) Advance directive discussed with you today. Even though you declined this today, please call our office should you change your mind, and we can give you the proper paperwork for you to fill out.  Next Medicare Annual Wellness Visit scheduled for next year: Yes
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
## 2023-08-01 ENCOUNTER — Emergency Department (HOSPITAL_COMMUNITY)

## 2023-08-01 ENCOUNTER — Encounter (HOSPITAL_COMMUNITY): Payer: Self-pay

## 2023-08-01 ENCOUNTER — Emergency Department (HOSPITAL_COMMUNITY)
Admission: EM | Admit: 2023-08-01 | Discharge: 2023-08-01 | Disposition: A | Discharge status: Home / Self Care | Attending: Emergency Medicine | Admitting: Emergency Medicine

## 2023-08-01 ENCOUNTER — Other Ambulatory Visit: Payer: Self-pay

## 2023-08-01 DIAGNOSIS — M79672 Pain in left foot: Secondary | ICD-10-CM | POA: Diagnosis present

## 2023-08-01 DIAGNOSIS — M7742 Metatarsalgia, left foot: Secondary | ICD-10-CM | POA: Diagnosis not present

## 2023-08-01 DIAGNOSIS — M722 Plantar fascial fibromatosis: Secondary | ICD-10-CM | POA: Insufficient documentation

## 2023-08-01 MED ORDER — NAPROXEN 500 MG PO TABS: 500.0000 mg | ORAL_TABLET | Freq: Two times a day (BID) | ORAL | 0 refills | Status: AC

## 2023-08-01 MED ORDER — KETOROLAC TROMETHAMINE 60 MG/2ML IM SOLN
30.0000 mg | Freq: Once | INTRAMUSCULAR | Status: AC
  Administered 2023-08-01: 30 mg via INTRAMUSCULAR
  Filled 2023-08-01: qty 2

## 2023-08-01 NOTE — ED Triage Notes (Signed)
 Left foot pain that started yesterday.  Foot is swollen.  Denies injury.

## 2023-08-01 NOTE — ED Provider Notes (Addendum)
 [place] EMERGENCY DEPARTMENT AT [facility] Provider Note   CSN: [id number] Arrival date & time: 08/01/23  1332     History  Chief Complaint  Patient presents with   Foot Pain    Chase Parsons is a 68 y.o. male.   Foot Pain     68 year old male presenting to the ER with a chief complaint of left foot pain. The patient states that for the past two days he has had pain and swelling to the lateral aspect of the left foot. He denies any falls or trauma to the foot. No redness. The foot is warm and mildly swollen. He has had some pain with attempts at ambulation. He is uncertain if he is experiencing a gout flare.   Home Medications Prior to Admission medications   Medication Sig Start Date End Date Taking? Authorizing Provider  amLODipine (NORVASC) 10 MG tablet Take 1 tablet (10 mg total) by mouth daily. 05/04/23   Monna Fam, MD  FLUoxetine (PROZAC) 10 MG capsule Take 1 capsule by mouth once daily 06/24/23   Atway, Rayann N, DO  folic acid (FOLVITE) 1 MG tablet Take 1 tablet (1 mg total) by mouth daily. Patient not taking: Reported on 07/07/2023 05/16/22   Marrianne Mood, MD  Multiple Vitamin (MULTIVITAMIN) tablet Take 1 tablet by mouth daily.    [provider]  thiamine (VITAMIN B1) 100 MG tablet Take 1 tablet (100 mg total) by mouth daily. Patient not taking: Reported on 07/07/2023 05/16/22   Marrianne Mood, MD  XIFAXAN 550 MG TABS tablet Take 1 tablet by mouth in the morning and at bedtime. 10/24/20   [provider]      Allergies    Patient has no known allergies.    Review of Systems   Review of Systems  All other systems reviewed and are negative.   Physical Exam Updated Vital Signs BP (!) 155/96 (BP Location: Right Arm)   Pulse (!) 113   Temp 97.8 F (36.6 C)   Resp 16   Ht 6' (1.829 m)   Wt 76.7 kg   SpO2 99%   BMI 22.92 kg/m  Physical Exam Vitals and nursing note reviewed.  Constitutional:      General: He is not in  acute distress. HENT:     Head: Normocephalic and atraumatic.  Eyes:     Conjunctiva/sclera: Conjunctivae normal.     Pupils: Pupils are equal, round, and reactive to light.  Cardiovascular:     Rate and Rhythm: Normal rate and regular rhythm.  Pulmonary:     Effort: Pulmonary effort is normal. No respiratory distress.  Abdominal:     General: There is no distension.     Tenderness: There is no guarding.  Musculoskeletal:        General: Swelling and tenderness present. No deformity or signs of injury.     Cervical back: Neck supple.     Comments: Left foot with mild TTP of the left lateral metatarsal, some mild TTP of the plantar fascia. 2+ DP pulses, no TTP of the great toe, no obvious erythema, slight edema present, no significant warmth  Skin:    Findings: No lesion or rash.  Neurological:     General: No focal deficit present.     Mental Status: He is alert. Mental status is at baseline.     ED Results / Procedures / Treatments   Labs (all labs ordered are listed, but only abnormal results are displayed) Labs  Reviewed - No data to display  EKG None  Radiology DG Foot Complete Left Result Date: 08/01/2023 CLINICAL DATA:  Foot pain and swelling. EXAM: LEFT FOOT - COMPLETE 3+ VIEW COMPARISON:  None Available. FINDINGS: Mild interphalangeal joint degenerative changes. The MTP joints are maintained. The visualized mid and hindfoot bony structures are intact. No fractures are identified. Small calcaneal heel spur. IMPRESSION: No significant degenerative changes and no acute bony findings. Electronically Signed   By: Rudie Meyer M.D.   On: 08/01/2023 14:59    Procedures Procedures    Medications Ordered in ED Medications  ketorolac (TORADOL) injection 30 mg (has no administration in time range)    ED Course/ Medical Decision Making/ A&P                                 Medical Decision Making Amount and/or Complexity of Data Reviewed Radiology:  ordered.  Risk Prescription drug management.     68 year old male presenting to the ER with a chief complaint of left foot pain. The patient states that for the past two days he has had pain and swelling to the lateral aspect of the left foot. He denies any falls or trauma to the foot. No redness. The foot is warm and mildly swollen. He has had some pain with attempts at ambulation. He is uncertain if he is experiencing a gout flare.   On arrival, the patient was vitally stable. Physical exam revealed Left foot with mild TTP of the left lateral metatarsal, some mild TTP of the plantar fascia. 2+ DP pulses, no TTP of the great toe, no obvious erythema, slight edema present, no significant warmth.  XR of the left foot was obtained and revealed: IMPRESSION:  No significant degenerative changes and no acute bony findings.   No evidence of traumatic injury. The clear evidence of cellulitis. Symptoms most like either plantar fasciitis or metatarsalgia. Low concern for osteomyelitis, no clear evidence for cellulitis, location would be more unusually for gout. Alternatively, symptoms could be due to a stress fracture of the foot. Pt was advised that outpatient MRI imaging could be beneficial to further evaluate if he continues to have pain. The patient was administered Toradol Im for pain control and placed in a post op shoe for comfort. He was advised, rest, ice, elevation of the extremity and NSAIDs for pain control. Recommended outpatient PCP follow-up, return precautions provided.   Final Clinical Impression(s) / ED Diagnoses Final diagnoses:  Metatarsalgia of left foot  Plantar fasciitis of right foot    Rx / DC Orders ED Discharge Orders     None         Ernie Avena, MD 08/01/23 1520    Ernie Avena, MD 08/01/23 1538

## 2023-08-01 NOTE — Discharge Instructions (Addendum)
 Your symptoms could be due to plantar fasciitis or metatarsalgia. A post op shoe has been provided for comfort. Recommend rest, ice, elevation of the extremity and NSAIDs for pain control. Additionally, your symptoms could be due to a stress fracture of the foot. Outpatient MRI imaging could be beneficial to further evaluate if you continue to have pain. Follow-up with a primary care provider for a repeat assessment, return if you develop severe worsening symptoms, which include worsening pain, swelling, redness of the foot which might necessitate repeat evaluation and oral antibiotics.

## 2023-08-16 IMAGING — US US ABDOMEN LIMITED
1 series · 14 of 25 positions shown · non-contrast
Comparison: None.

CLINICAL DATA: Alcoholic cirrhosis without ascites.

EXAM:
ULTRASOUND ABDOMEN LIMITED RIGHT UPPER QUADRANT

[Series 1: us abdomen limited · 0.22mm/px · 14 of 36 slices shown]
[im 1/36]
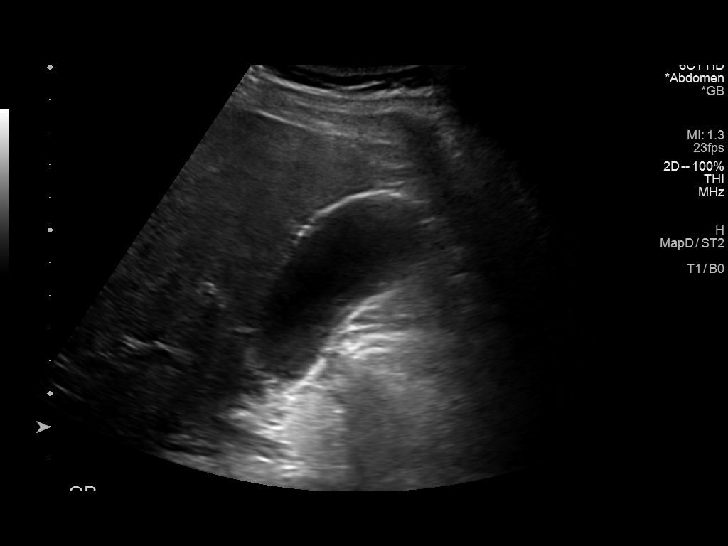
[im 3/36]
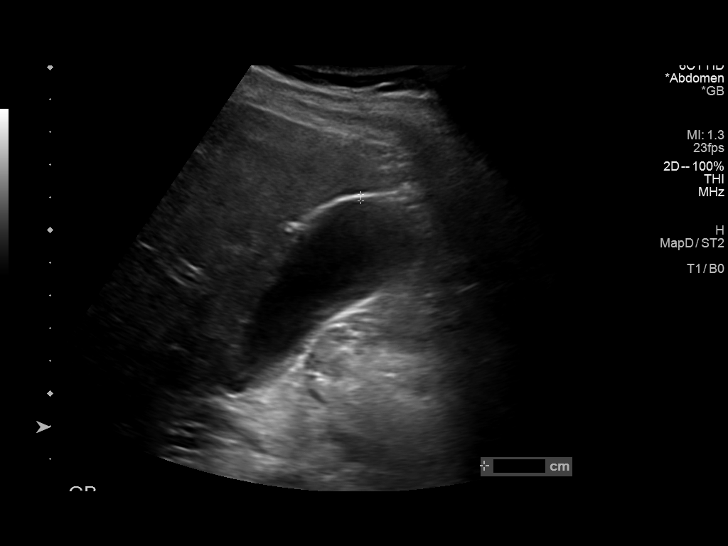
[im 6/36]
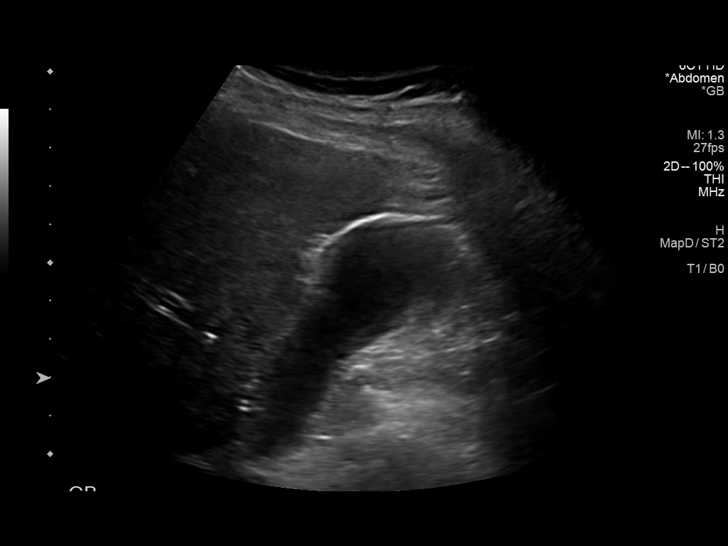
[im 9/36]
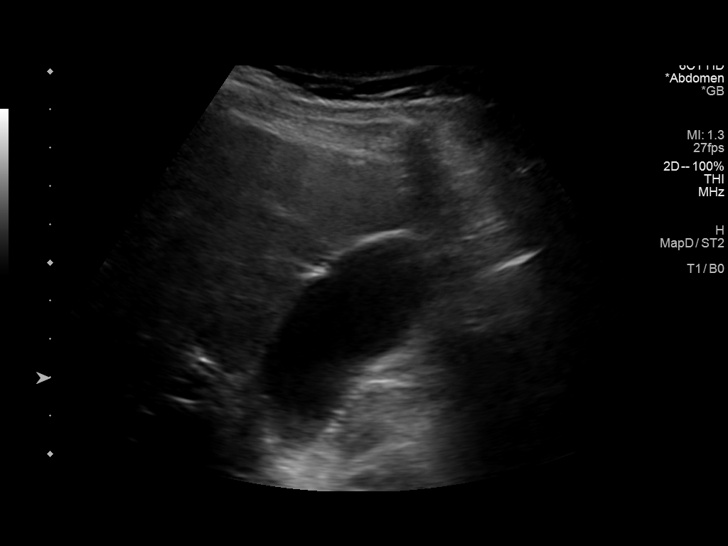
[im 12/36]
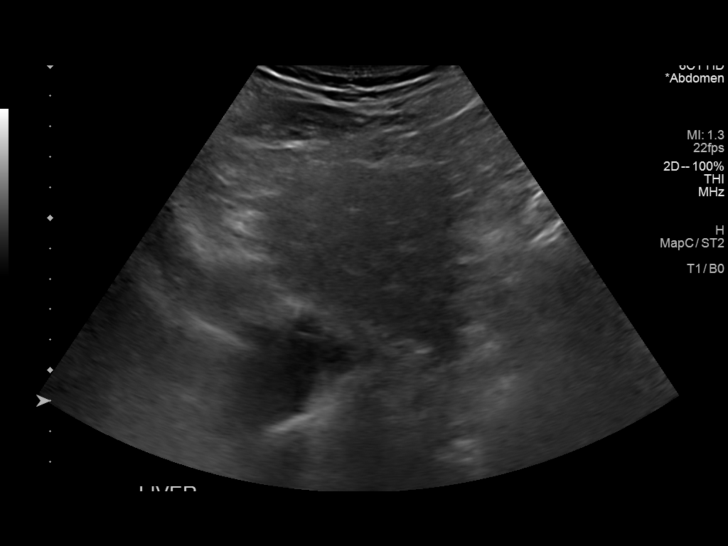
[im 14/36]
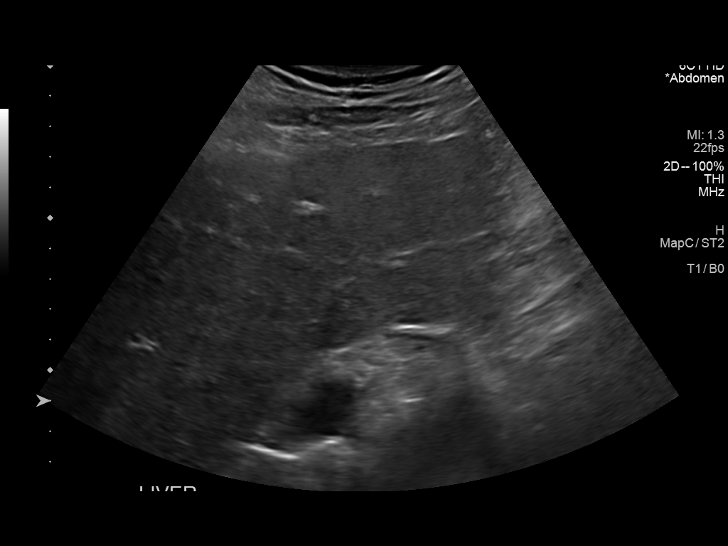
[im 17/36]
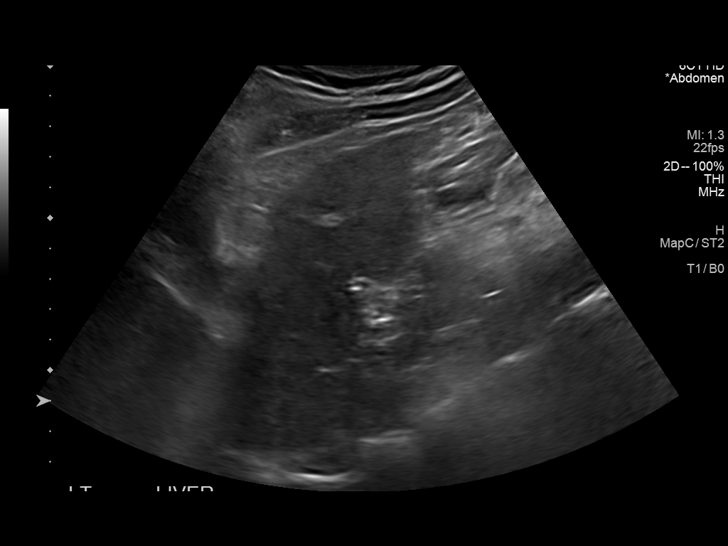
[im 19/36]
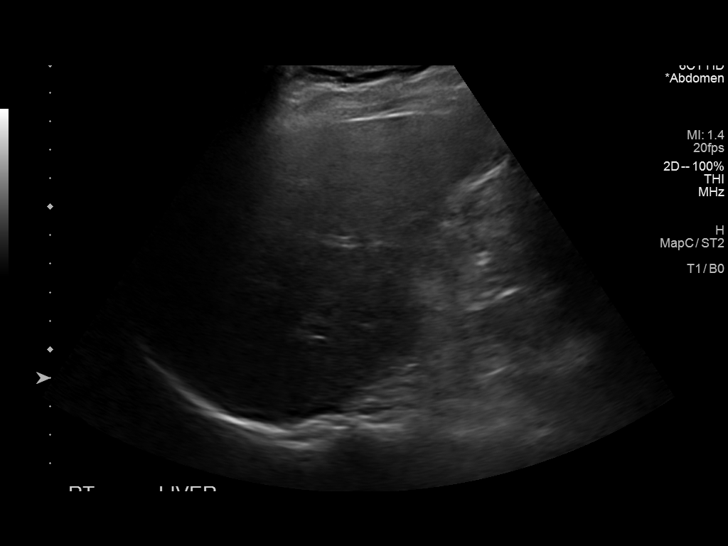
[im 22/36]
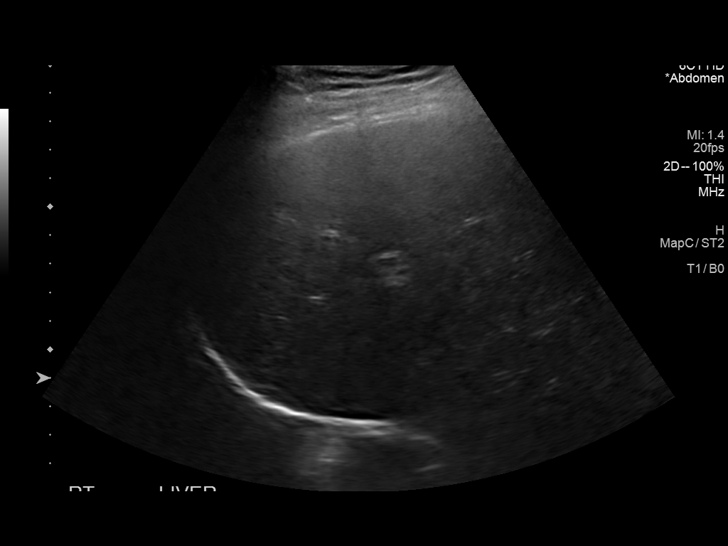
[im 24/36]
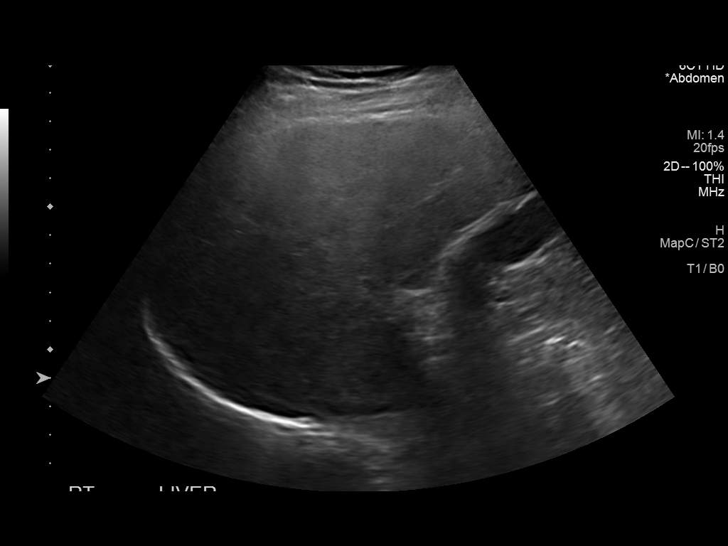
[im 27/36]
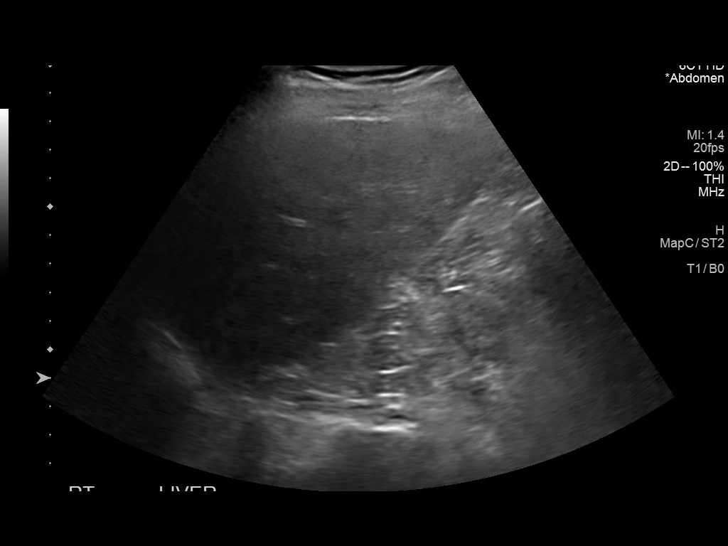
[im 30/36]
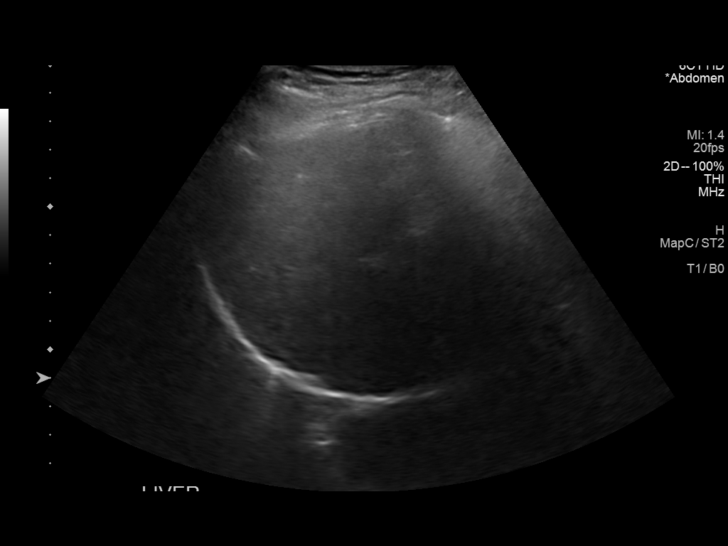
[im 33/36]
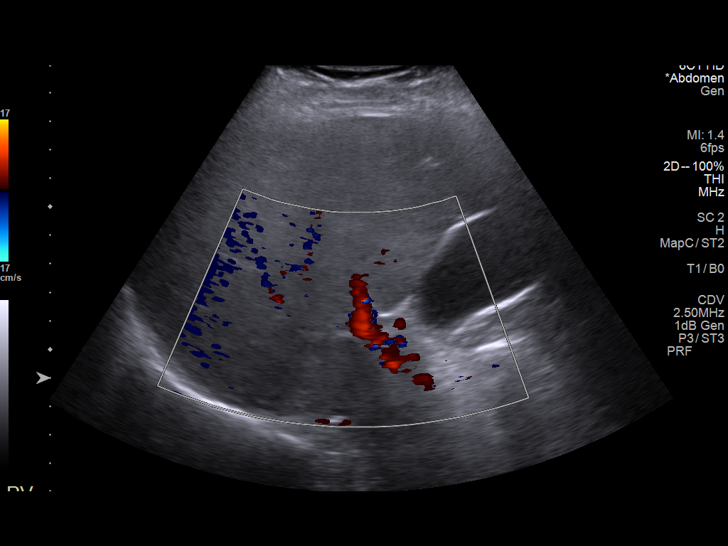
[im 36/36]
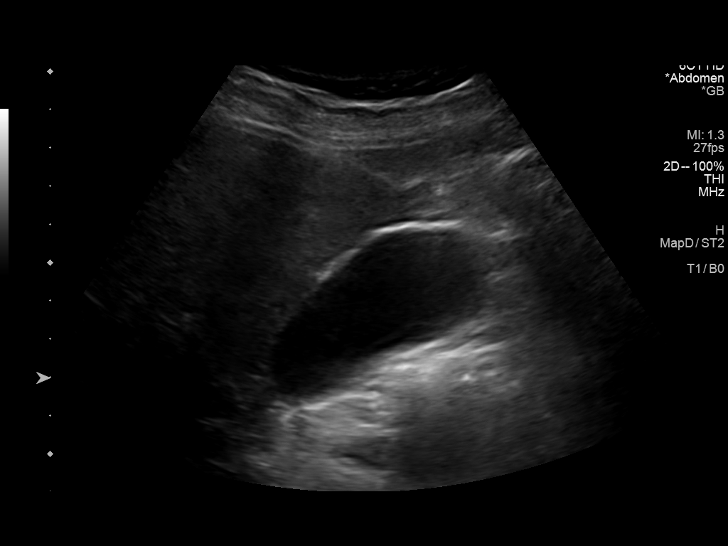

[14 of 25 positions shown; findings below may reference images not displayed]

FINDINGS: Gallbladder:

No gallstones or wall thickening visualized. No sonographic Murphy
sign noted by sonographer. Scant amount of pericholecystic fluid.

Common bile duct:

Diameter: 2.5 mm.

Liver:

Coarse parenchymal echogenicity compatible with a degree of
steatosis without focal mass. Portal vein is patent on color Doppler
imaging with normal direction of blood flow towards the liver.

Other: No ascites.
IMPRESSION: Findings suggesting a degree of hepatic steatosis. No significant
ascites.

## 2023-08-24 NOTE — Addendum Note (Signed)
 Addended by: Margette Sheldon on: 08/24/2023 11:11 AM   Modules accepted: Level of Service

## 2023-09-22 ENCOUNTER — Other Ambulatory Visit: Payer: Self-pay | Admitting: Internal Medicine

## 2023-09-22 DIAGNOSIS — F322 Major depressive disorder, single episode, severe without psychotic features: Secondary | ICD-10-CM

## 2023-10-14 LAB — HM COLONOSCOPY

## 2023-11-04 ENCOUNTER — Encounter: Payer: Self-pay | Admitting: Gastroenterology

## 2023-11-16 NOTE — Assessment & Plan Note (Signed)
 Patient is followed closely by GI for management of his alcoholic cirrhosis. Currently taking Xifxan.  Recently had colonoscopy and was told to repeat in 5 years.  On exam the patient is euvolemic; no fluid wave appreciated.  No ascites.  Weight has been stable over the past few months. - Continue rifaximin  - Follow-up with GI

## 2023-11-16 NOTE — Assessment & Plan Note (Signed)
 Today patient's blood pressure is _____. At last visit in January 2025, patient's amlodipine  5 mg was increased to 10 mg.  Patient reports good adherence to medication. -Continue amlodipine  10 mg daily

## 2023-11-16 NOTE — Assessment & Plan Note (Addendum)
 Patient feels that his mood is stable.  Denies SI/HI, hallucinations, delusions.  PHQ-9 is ___ today.  Patient was previously stable on Prozac  10 mg. -

## 2023-11-16 NOTE — Progress Notes (Unsigned)
 Patient name: Chase Parsons Date of birth: 06/09/1955 Date of visit: 11/18/23  Type of visit: Established Patient Office Visit  Subjective   Chief concern:  Chief Complaint  Patient presents with   Follow-up    Chase Parsons is a 68 y.o. male with a PMHx of CAD, MDD, CAD, alcohol use disorder complicated by cirrhosis, hypertension who presents to [facility] forfollow-up of chronic conditions.  Please see assessment and plan for further details by problem.  Patient Active Problem List   Diagnosis Date Noted   History of adenomatous polyp of colon 04/08/2023   Hypokalemia 05/18/2022   CAD in native artery 02/25/2021   Blurry vision, bilateral 09/06/2020   Weight loss, unintentional 09/06/2020   Elevated ferritin level 05/21/2020   Alcoholic cirrhosis (HCC) 02/29/2020   Hypertension 02/29/2020   Diarrhea 12/06/2019   Insomnia 12/06/2019   Healthcare maintenance 12/06/2019   GERD (gastroesophageal reflux disease) 12/06/2019   Alcohol use disorder, mild, in controlled environment 10/24/2019   MDD (major depressive disorder), single episode, severe , no psychosis (HCC) 01/26/2015     Past Surgical History:  Procedure Laterality Date   BIOPSY  02/20/2020   Procedure: BIOPSY;  Surgeon: Elicia Claw, MD;  Location: [facility];  Service: Gastroenterology;;  EGD and COLON   COLONOSCOPY WITH PROPOFOL  N/A 02/20/2020   Procedure: COLONOSCOPY WITH PROPOFOL ;  Surgeon: Elicia Claw, MD;  Location: [facility];  Service: Gastroenterology;  Laterality: N/A;   ESOPHAGOGASTRODUODENOSCOPY N/A 08/30/2020   Procedure: ESOPHAGOGASTRODUODENOSCOPY (EGD);  Surgeon: Elicia Claw, MD;  Location[facility] END[facility]COPY;  Service: Gastroenterology;  Laterality: N/A;   ESOPHAGOGASTRODUODENOSCOPY (EGD) WITH PROPOFOL  N/A 02/20/2020   Procedure: ESOPHAGOGASTRODUODENOSCOPY (EGD) WITH PROPOFOL ;  Surgeon: Elicia Claw, MD;  Location: [facility];  Service: Gastroenterology;  Laterality:  N/A;   POLYPECTOMY  02/20/2020   Procedure: POLYPECTOMY;  Surgeon: Elicia Claw, MD;  Location: [facility];  Service: Gastroenterology;;    Review of Systems  Constitutional:  Negative for chills, fever, malaise/fatigue and weight loss.  Respiratory:  Negative for cough, hemoptysis, shortness of breath and wheezing.   Cardiovascular:  Negative for chest pain.  Gastrointestinal:  Positive for diarrhea (when he stops drinking). Negative for abdominal pain, blood in stool, heartburn, nausea and vomiting.  Genitourinary:  Negative for dysuria.  Neurological:  Negative for tremors.  Psychiatric/Behavioral:  Negative for hallucinations and suicidal ideas. The patient is not nervous/anxious.     Current Outpatient Medications  Medication Instructions   amLODipine  (NORVASC ) 10 mg, Oral, Daily   FLUoxetine  (PROZAC ) 10 mg, Oral, Daily   folic acid  (FOLVITE ) 1 mg, Oral, Daily   Multiple Vitamin (MULTIVITAMIN) tablet 1 tablet, Daily   naproxen  (NAPROSYN ) 500 mg, Oral, 2 times daily   thiamine  (VITAMIN B1) 100 mg, Oral, Daily   XIFAXAN  550 MG TABS tablet 1 tablet, 2 times daily    Social History   Tobacco Use   Smoking status: Never   Smokeless tobacco: Never  Vaping Use   Vaping status: Never Used  Substance Use Topics   Alcohol use: Yes    Comment: 40 oz beer per day   Drug use: No      Objective  Today's Vitals   11/18/23 1003  BP: (!) 132/92  Pulse: 100  Temp: 97.7 F (36.5 C)  TempSrc: Oral  SpO2: 96%  Weight: 169 lb 9.6 oz (76.9 kg)  Height: 6' (1.829 m)  PainSc: 0-No pain  Body mass index is 23 kg/m.   Physical Exam: Constitutional: well-appearing, well-nourished; normal weight; no  acute distress.  Sitting in chair comfortably. HENT: normocephalic atraumatic, mucous membranes moist Eyes: conjunctiva non-erythematous Cardiovascular: regular rate and rhythm, no m/r/g Pulmonary/Chest: normal work of breathing on room air, lungs clear to auscultation  bilaterally Abdominal: soft, non-tender, slightly distended, absent fluid wave, no shifting dullness appreciated MSK: normal bulk and tone Neurological: alert & oriented x 3, no focal deficit.  No hand tremor observed. Skin: warm and dry Extremities: BLE without edema or erythema. Psych: normal mood and behavior, appropriate thought content. Not responding to internal cues.   Last CBC Lab Results  Component Value Date   WBC 8.0 05/04/2023   HGB 13.3 05/04/2023   HCT 40.6 05/04/2023   MCV 99 (H) 05/04/2023   MCH 32.4 05/04/2023   RDW 11.5 (L) 05/04/2023   PLT 247 05/04/2023   Last vitamin B12 and Folate Lab Results  Component Value Date   VITAMINB12 243 08/26/2020   FOLATE 23.0 08/26/2020        Assessment & Plan  Hypertension, unspecified type Assessment & Plan: Today patient's blood pressure is 132/92. At last visit in January 2025, patient's amlodipine  5 mg was increased to 10 mg.  Patient reports good adherence to medication. - Continue amlodipine  10 mg daily - Encourage patient to check blood pressure at home and bring readings to next appointment   MDD (major depressive disorder), single episode, severe , no psychosis (HCC) Assessment & Plan: Patient feels that his mood is stable.  Denies SI/HI, hallucinations, delusions.  PHQ-9 is 7 today. Patient was previously stable on Prozac  10 mg. - Continue Prozac    Alcoholic cirrhosis of liver without ascites (HCC) Assessment & Plan: Patient is followed closely by GI for management of his alcoholic cirrhosis. Currently taking Xifxan.  Recently had colonoscopy and was told to repeat in 5 years.  On exam the patient is euvolemic; no fluid wave appreciated.  No ascites.  Weight has been stable over the past few months. - Continue rifaximin  - Follow-up with GI   Alcohol use disorder, mild, in controlled environment Assessment & Plan: Patient reports drinking 2x 40 ounce beers per day.  Mentions tremor sometimes before he  drinks his beer.  Also reporting occasional tactile hallucinations.  Counseled the patient at length about the importance of alcohol cessation, but the patient is in the pre-contemplative stage and does not want to change his alcohol intake at this time.  Exam grossly normal.  No tremors noted, patient not responding to internal cues.    Return in about 6 months (around 05/20/2024) for routine follow up chronic conditions.   Patient discussed with Dr. Jeanelle, who also saw and evaluated the patient.  Niel Peretti, MD [facility]  PGY-1 11/18/2023, 11:40 AM

## 2023-11-18 ENCOUNTER — Ambulatory Visit

## 2023-11-18 VITALS — BP 132/92 | HR 100 | Temp 97.7°F | Ht 72.0 in | Wt 169.6 lb

## 2023-11-18 DIAGNOSIS — F322 Major depressive disorder, single episode, severe without psychotic features: Secondary | ICD-10-CM

## 2023-11-18 DIAGNOSIS — F1010 Alcohol abuse, uncomplicated: Secondary | ICD-10-CM | POA: Diagnosis not present

## 2023-11-18 DIAGNOSIS — I10 Essential (primary) hypertension: Secondary | ICD-10-CM | POA: Diagnosis present

## 2023-11-18 DIAGNOSIS — K7030 Alcoholic cirrhosis of liver without ascites: Secondary | ICD-10-CM

## 2023-11-18 NOTE — Assessment & Plan Note (Addendum)
 Patient reports drinking 2x 40 ounce beers per day.  Mentions tremor sometimes before he drinks his beer.  Also reporting occasional tactile hallucinations.  Counseled the patient at length about the importance of alcohol cessation, but the patient is in the pre-contemplative stage and does not want to change his alcohol intake at this time.  Exam grossly normal.  No tremors noted, patient not responding to internal cues.

## 2023-11-18 NOTE — Patient Instructions (Addendum)
 Thank you, [name] for allowing us  to provide your care today. Today we discussed the following:  - Try to reduce your alcohol intake. If you are ready to talk about stopping drinking.  - Please continue following with your Gastroenterologist for your cirrhosis. - Please continue taking your amlodipine  for high blood pressure. Please check your blood pressure at home after taking your blood pressure medication.  Bring your readings to your next appointment so that we can review and make any necessary adjustments to your medications.  Follow up: 6 months    Remember: Please continue to think about quitting drinking alcohol as it is severely impacting your health.  Should you have any questions or concerns please call the Internal Medicine Clinic at [phone].     Krishan Mcbreen, MD [facility]

## 2023-12-02 NOTE — Progress Notes (Signed)
 Internal Medicine Clinic Attending  I was physically present during the key portions of the resident provided service and participated in the medical decision making of patient's management care. I reviewed pertinent patient test results.  The assessment, diagnosis, and plan were formulated together and I agree with the documentation in the resident's note.  Carney Living, MD

## 2023-12-06 IMAGING — CR DG CHEST 2V
2 series · 2 of 2 positions shown · non-contrast
Comparison: Chest radiograph 05/13/2020

CLINICAL DATA: On and off chest pain for 2 weeks

EXAM:
CHEST - 2 VIEW

[chest pa]
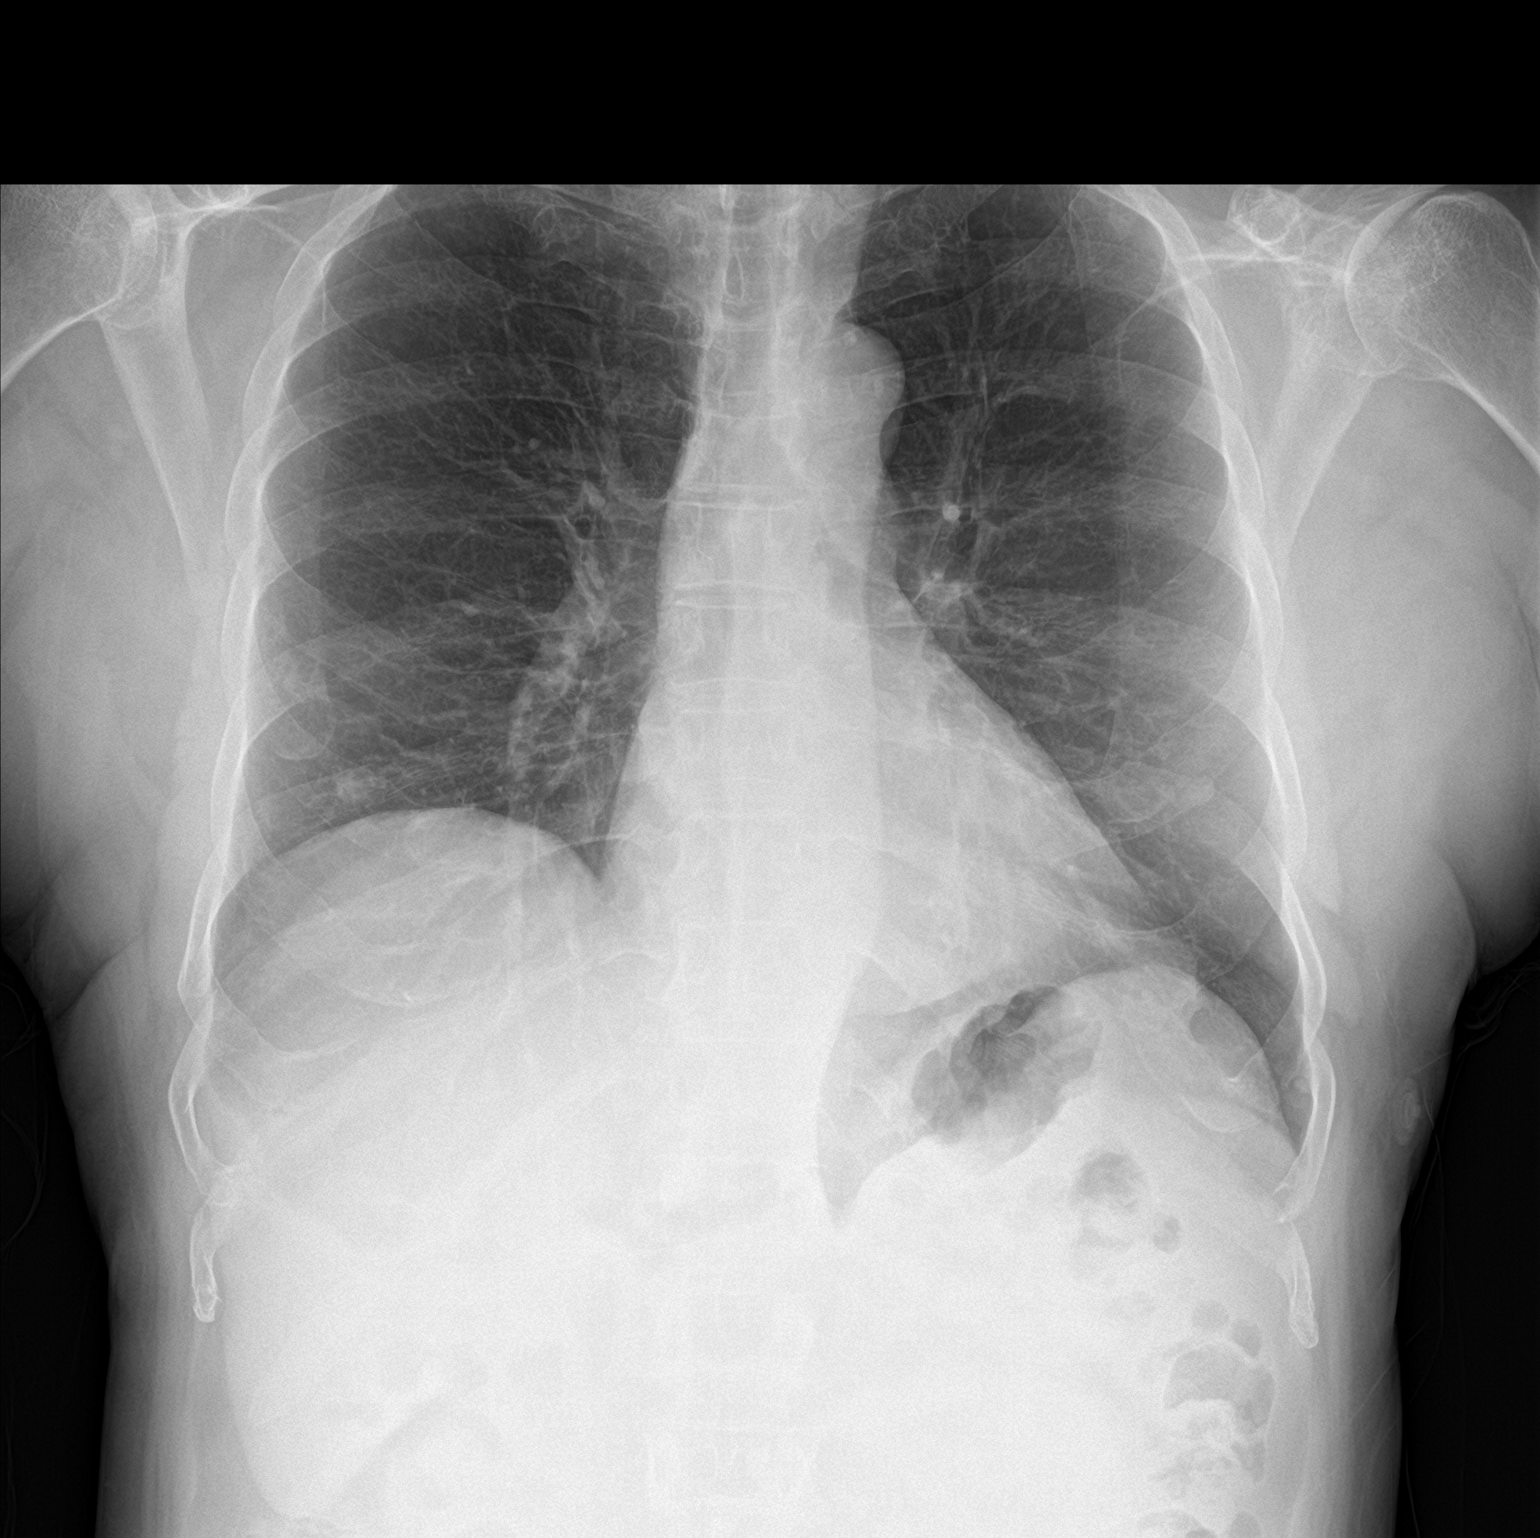

[chest lat]
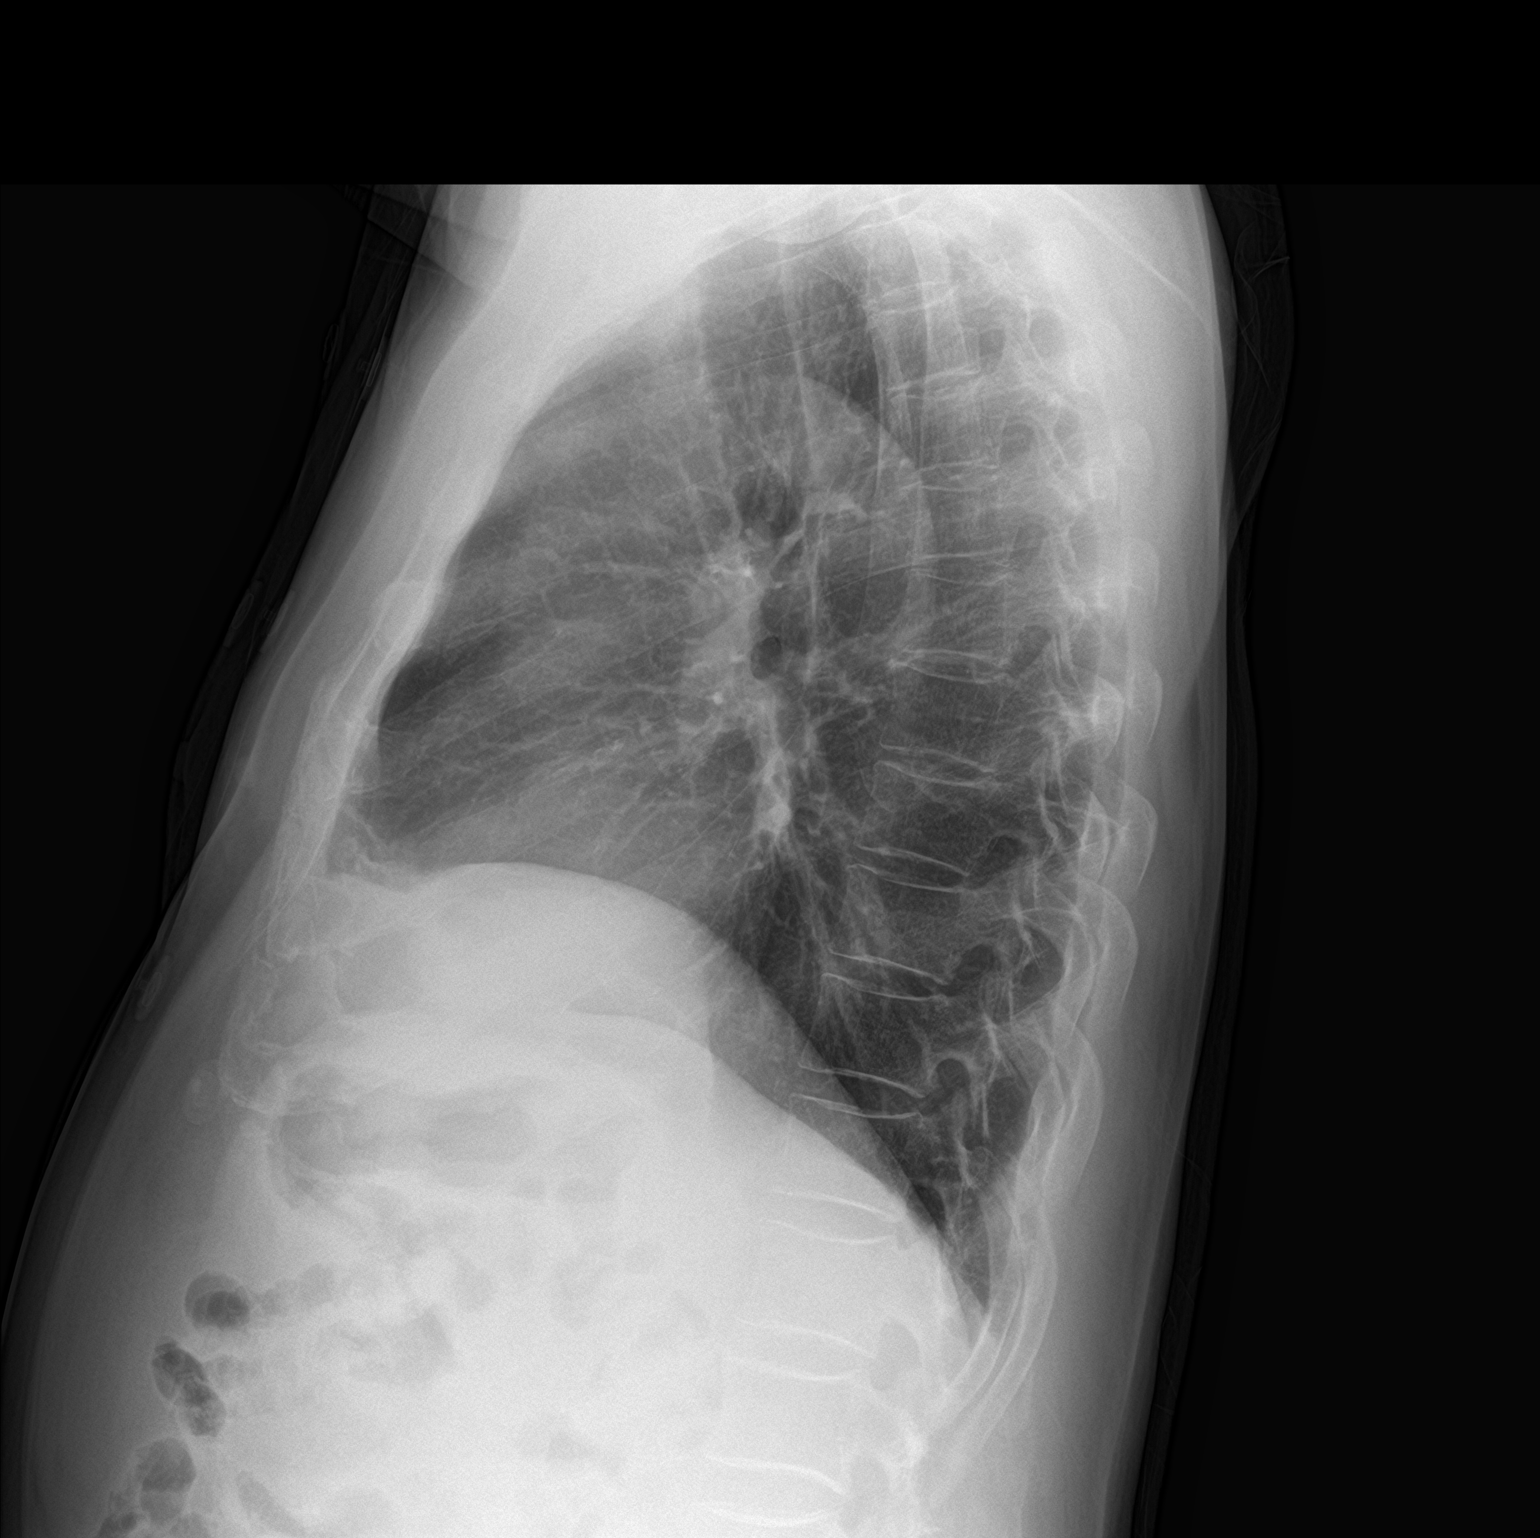

[2 of 2 positions shown; findings below may reference images not displayed]

FINDINGS: The cardiomediastinal silhouette is normal

There is no focal consolidation or pulmonary edema. There is no
pleural effusion or pneumothorax. There is asymmetric elevation of
the right hemidiaphragm.

There is a remote fracture of the left clavicle, unchanged. There is
no acute osseous abnormality.
IMPRESSION: No radiographic evidence of acute cardiopulmonary process.

## 2023-12-23 ENCOUNTER — Other Ambulatory Visit: Payer: Self-pay

## 2023-12-23 DIAGNOSIS — I10 Essential (primary) hypertension: Secondary | ICD-10-CM

## 2023-12-23 MED ORDER — AMLODIPINE BESYLATE 10 MG PO TABS: 10.0000 mg | ORAL_TABLET | Freq: Every day | ORAL | 3 refills | Status: AC

## 2024-03-17 ENCOUNTER — Other Ambulatory Visit: Payer: Self-pay

## 2024-03-17 DIAGNOSIS — F322 Major depressive disorder, single episode, severe without psychotic features: Secondary | ICD-10-CM

## 2024-03-17 MED ORDER — FLUOXETINE HCL 10 MG PO CAPS: 10.0000 mg | ORAL_CAPSULE | Freq: Every day | ORAL | 1 refills | Status: AC

## 2024-03-17 NOTE — Telephone Encounter (Signed)
 Medication sent to pharmacy

## 2024-07-12 ENCOUNTER — Ambulatory Visit
